# Patient Record
Sex: Female | Born: 1965 | Race: White | Hispanic: No | Marital: Married | Smoking: Never smoker
Health system: Southern US, Community
[De-identification: ages and names within clinical notes are randomized; demographics above are authoritative.]

## PROBLEM LIST (undated history)

## (undated) DIAGNOSIS — N12 Tubulo-interstitial nephritis, not specified as acute or chronic: Secondary | ICD-10-CM

## (undated) DIAGNOSIS — S73192A Other sprain of left hip, initial encounter: Secondary | ICD-10-CM

## (undated) DIAGNOSIS — Z9889 Other specified postprocedural states: Secondary | ICD-10-CM

## (undated) DIAGNOSIS — K589 Irritable bowel syndrome without diarrhea: Secondary | ICD-10-CM

## (undated) DIAGNOSIS — F419 Anxiety disorder, unspecified: Secondary | ICD-10-CM

## (undated) DIAGNOSIS — H8110 Benign paroxysmal vertigo, unspecified ear: Secondary | ICD-10-CM

## (undated) DIAGNOSIS — M5030 Other cervical disc degeneration, unspecified cervical region: Secondary | ICD-10-CM

## (undated) DIAGNOSIS — M436 Torticollis: Secondary | ICD-10-CM

## (undated) DIAGNOSIS — R079 Chest pain, unspecified: Secondary | ICD-10-CM

## (undated) DIAGNOSIS — R945 Abnormal results of liver function studies: Secondary | ICD-10-CM

## (undated) DIAGNOSIS — K5900 Constipation, unspecified: Secondary | ICD-10-CM

## (undated) DIAGNOSIS — R42 Dizziness and giddiness: Secondary | ICD-10-CM

## (undated) DIAGNOSIS — Z8759 Personal history of other complications of pregnancy, childbirth and the puerperium: Secondary | ICD-10-CM

## (undated) DIAGNOSIS — IMO0002 Reserved for concepts with insufficient information to code with codable children: Secondary | ICD-10-CM

## (undated) DIAGNOSIS — F4321 Adjustment disorder with depressed mood: Secondary | ICD-10-CM

## (undated) DIAGNOSIS — R161 Splenomegaly, not elsewhere classified: Secondary | ICD-10-CM

## (undated) DIAGNOSIS — D239 Other benign neoplasm of skin, unspecified: Secondary | ICD-10-CM

## (undated) DIAGNOSIS — R112 Nausea with vomiting, unspecified: Secondary | ICD-10-CM

## (undated) DIAGNOSIS — J189 Pneumonia, unspecified organism: Secondary | ICD-10-CM

## (undated) DIAGNOSIS — C539 Malignant neoplasm of cervix uteri, unspecified: Secondary | ICD-10-CM

## (undated) DIAGNOSIS — R519 Headache, unspecified: Secondary | ICD-10-CM

## (undated) DIAGNOSIS — F341 Dysthymic disorder: Secondary | ICD-10-CM

## (undated) DIAGNOSIS — M1612 Unilateral primary osteoarthritis, left hip: Secondary | ICD-10-CM

## (undated) DIAGNOSIS — K219 Gastro-esophageal reflux disease without esophagitis: Secondary | ICD-10-CM

## (undated) DIAGNOSIS — N200 Calculus of kidney: Secondary | ICD-10-CM

## (undated) DIAGNOSIS — N390 Urinary tract infection, site not specified: Secondary | ICD-10-CM

## (undated) DIAGNOSIS — N3010 Interstitial cystitis (chronic) without hematuria: Secondary | ICD-10-CM

## (undated) DIAGNOSIS — I479 Paroxysmal tachycardia, unspecified: Secondary | ICD-10-CM

## (undated) DIAGNOSIS — E785 Hyperlipidemia, unspecified: Secondary | ICD-10-CM

## (undated) HISTORY — DX: Malignant neoplasm of cervix uteri, unspecified: C53.9

## (undated) HISTORY — DX: Constipation, unspecified: K59.00

## (undated) HISTORY — DX: Tubulo-interstitial nephritis, not specified as acute or chronic: N12

## (undated) HISTORY — DX: Personal history of other complications of pregnancy, childbirth and the puerperium: Z87.59

## (undated) HISTORY — DX: Other benign neoplasm of skin, unspecified: D23.9

## (undated) HISTORY — DX: Interstitial cystitis (chronic) without hematuria: N30.10

## (undated) HISTORY — DX: Adjustment disorder with depressed mood: F43.21

## (undated) HISTORY — DX: Chest pain, unspecified: R07.9

## (undated) HISTORY — DX: Reserved for concepts with insufficient information to code with codable children: IMO0002

## (undated) HISTORY — DX: Calculus of kidney: N20.0

## (undated) HISTORY — DX: Anxiety disorder, unspecified: F41.9

## (undated) HISTORY — DX: Abnormal results of liver function studies: R94.5

## (undated) HISTORY — DX: Irritable bowel syndrome, unspecified: K58.9

## (undated) HISTORY — DX: Dysthymic disorder: F34.1

## (undated) HISTORY — DX: Pneumonia, unspecified organism: J18.9

## (undated) HISTORY — DX: Splenomegaly, not elsewhere classified: R16.1

## (undated) HISTORY — DX: Other cervical disc degeneration, unspecified cervical region: M50.30

## (undated) HISTORY — DX: Urinary tract infection, site not specified: N39.0

---

## 1995-08-28 HISTORY — PX: ABDOMINAL EXPLORATION SURGERY: SHX538

## 1998-06-24 ENCOUNTER — Ambulatory Visit (HOSPITAL_COMMUNITY): Admission: RE | Admit: 1998-06-24 | Discharge: 1998-06-24 | Payer: Self-pay | Admitting: Gastroenterology

## 1998-08-27 HISTORY — PX: ABDOMINAL HYSTERECTOMY: SHX81

## 1998-08-27 HISTORY — PX: TOTAL ABDOMINAL HYSTERECTOMY: SHX209

## 1999-03-08 ENCOUNTER — Other Ambulatory Visit: Admission: RE | Admit: 1999-03-08 | Discharge: 1999-03-08 | Payer: Self-pay | Admitting: Obstetrics & Gynecology

## 1999-04-19 ENCOUNTER — Other Ambulatory Visit: Admission: RE | Admit: 1999-04-19 | Discharge: 1999-04-19 | Payer: Self-pay | Admitting: Obstetrics & Gynecology

## 1999-08-03 ENCOUNTER — Other Ambulatory Visit: Admission: RE | Admit: 1999-08-03 | Discharge: 1999-08-03 | Payer: Self-pay | Admitting: Obstetrics & Gynecology

## 1999-10-24 ENCOUNTER — Inpatient Hospital Stay (HOSPITAL_COMMUNITY): Admission: RE | Admit: 1999-10-24 | Discharge: 1999-10-26 | Payer: Self-pay | Admitting: Obstetrics & Gynecology

## 2000-08-14 ENCOUNTER — Other Ambulatory Visit: Admission: RE | Admit: 2000-08-14 | Discharge: 2000-08-14 | Payer: Self-pay | Admitting: Obstetrics & Gynecology

## 2001-03-28 ENCOUNTER — Emergency Department (HOSPITAL_COMMUNITY): Admission: EM | Admit: 2001-03-28 | Discharge: 2001-03-29 | Payer: Self-pay | Admitting: Emergency Medicine

## 2001-03-29 ENCOUNTER — Encounter: Payer: Self-pay | Admitting: Emergency Medicine

## 2001-09-12 ENCOUNTER — Other Ambulatory Visit: Admission: RE | Admit: 2001-09-12 | Discharge: 2001-09-12 | Payer: Self-pay | Admitting: Obstetrics & Gynecology

## 2003-10-05 ENCOUNTER — Other Ambulatory Visit: Admission: RE | Admit: 2003-10-05 | Discharge: 2003-10-05 | Payer: Self-pay | Admitting: Obstetrics & Gynecology

## 2004-11-16 ENCOUNTER — Emergency Department: Payer: Self-pay | Admitting: Emergency Medicine

## 2005-12-24 ENCOUNTER — Ambulatory Visit: Payer: Self-pay | Admitting: Internal Medicine

## 2005-12-25 ENCOUNTER — Ambulatory Visit: Payer: Self-pay | Admitting: Internal Medicine

## 2007-02-15 ENCOUNTER — Inpatient Hospital Stay: Payer: Self-pay | Admitting: Internal Medicine

## 2007-03-26 ENCOUNTER — Ambulatory Visit: Payer: Self-pay | Admitting: Family Medicine

## 2007-04-11 ENCOUNTER — Ambulatory Visit: Payer: Self-pay | Admitting: Urology

## 2007-11-16 ENCOUNTER — Emergency Department: Payer: Self-pay | Admitting: Emergency Medicine

## 2007-11-16 ENCOUNTER — Other Ambulatory Visit: Payer: Self-pay

## 2008-04-07 ENCOUNTER — Ambulatory Visit: Payer: Self-pay

## 2008-05-10 ENCOUNTER — Ambulatory Visit: Payer: Self-pay | Admitting: Urology

## 2009-05-05 ENCOUNTER — Emergency Department: Payer: Self-pay | Admitting: Emergency Medicine

## 2009-08-02 ENCOUNTER — Ambulatory Visit: Payer: Self-pay | Admitting: Family Medicine

## 2010-01-31 ENCOUNTER — Ambulatory Visit: Payer: Self-pay | Admitting: Obstetrics and Gynecology

## 2010-04-03 ENCOUNTER — Ambulatory Visit: Payer: Self-pay | Admitting: Gastroenterology

## 2010-04-05 LAB — PATHOLOGY REPORT

## 2010-08-27 HISTORY — PX: COLONOSCOPY: SHX174

## 2010-10-30 ENCOUNTER — Ambulatory Visit: Payer: Self-pay

## 2011-01-28 ENCOUNTER — Ambulatory Visit: Payer: Self-pay | Admitting: Otolaryngology

## 2011-09-28 DIAGNOSIS — IMO0002 Reserved for concepts with insufficient information to code with codable children: Secondary | ICD-10-CM

## 2011-09-28 HISTORY — PX: ESOPHAGOGASTRODUODENOSCOPY: SHX1529

## 2011-09-28 HISTORY — DX: Reserved for concepts with insufficient information to code with codable children: IMO0002

## 2011-11-05 ENCOUNTER — Ambulatory Visit: Payer: Self-pay | Admitting: Gastroenterology

## 2012-01-02 LAB — HM MAMMOGRAPHY

## 2012-06-04 ENCOUNTER — Ambulatory Visit: Payer: Self-pay | Admitting: Family Medicine

## 2012-11-24 ENCOUNTER — Ambulatory Visit: Payer: BC Managed Care – PPO

## 2012-11-24 ENCOUNTER — Encounter: Payer: Self-pay | Admitting: Family Medicine

## 2012-11-24 ENCOUNTER — Ambulatory Visit (INDEPENDENT_AMBULATORY_CARE_PROVIDER_SITE_OTHER): Payer: BC Managed Care – PPO | Admitting: Family Medicine

## 2012-11-24 VITALS — BP 123/79 | HR 66 | Temp 97.2°F | Resp 16 | Ht 66.0 in | Wt 161.0 lb

## 2012-11-24 DIAGNOSIS — R1012 Left upper quadrant pain: Secondary | ICD-10-CM

## 2012-11-24 DIAGNOSIS — M542 Cervicalgia: Secondary | ICD-10-CM

## 2012-11-24 DIAGNOSIS — R1013 Epigastric pain: Secondary | ICD-10-CM

## 2012-11-24 DIAGNOSIS — H8110 Benign paroxysmal vertigo, unspecified ear: Secondary | ICD-10-CM

## 2012-11-24 DIAGNOSIS — R161 Splenomegaly, not elsewhere classified: Secondary | ICD-10-CM

## 2012-11-24 DIAGNOSIS — Z1239 Encounter for other screening for malignant neoplasm of breast: Secondary | ICD-10-CM

## 2012-11-24 DIAGNOSIS — T753XXA Motion sickness, initial encounter: Secondary | ICD-10-CM

## 2012-11-24 DIAGNOSIS — Z1231 Encounter for screening mammogram for malignant neoplasm of breast: Secondary | ICD-10-CM

## 2012-11-24 LAB — COMPREHENSIVE METABOLIC PANEL WITH GFR
ALT: 27 U/L (ref 0–35)
AST: 17 U/L (ref 0–37)
Albumin: 4.3 g/dL (ref 3.5–5.2)
Alkaline Phosphatase: 44 U/L (ref 39–117)
BUN: 14 mg/dL (ref 6–23)
CO2: 29 meq/L (ref 19–32)
Calcium: 9.5 mg/dL (ref 8.4–10.5)
Chloride: 102 meq/L (ref 96–112)
Creat: 0.71 mg/dL (ref 0.50–1.10)
Glucose, Bld: 86 mg/dL (ref 70–99)
Potassium: 4.2 meq/L (ref 3.5–5.3)
Sodium: 140 meq/L (ref 135–145)
Total Bilirubin: 0.6 mg/dL (ref 0.3–1.2)
Total Protein: 6.7 g/dL (ref 6.0–8.3)

## 2012-11-24 LAB — CBC WITH DIFFERENTIAL/PLATELET
Basophils Absolute: 0 10*3/uL (ref 0.0–0.1)
Basophils Relative: 1 % (ref 0–1)
Eosinophils Absolute: 0.1 10*3/uL (ref 0.0–0.7)
Eosinophils Relative: 1 % (ref 0–5)
HCT: 44.1 % (ref 36.0–46.0)
Hemoglobin: 15.1 g/dL — ABNORMAL HIGH (ref 12.0–15.0)
Lymphocytes Relative: 25 % (ref 12–46)
Lymphs Abs: 1.9 10*3/uL (ref 0.7–4.0)
MCH: 31.7 pg (ref 26.0–34.0)
MCHC: 34.2 g/dL (ref 30.0–36.0)
MCV: 92.5 fL (ref 78.0–100.0)
Monocytes Absolute: 0.3 10*3/uL (ref 0.1–1.0)
Monocytes Relative: 5 % (ref 3–12)
Neutro Abs: 5.3 10*3/uL (ref 1.7–7.7)
Neutrophils Relative %: 68 % (ref 43–77)
Platelets: 269 10*3/uL (ref 150–400)
RBC: 4.77 MIL/uL (ref 3.87–5.11)
RDW: 12.1 % (ref 11.5–15.5)
WBC: 7.6 10*3/uL (ref 4.0–10.5)

## 2012-11-24 LAB — POCT URINALYSIS DIPSTICK
Bilirubin, UA: NEGATIVE
Blood, UA: NEGATIVE
Glucose, UA: NEGATIVE
Ketones, UA: NEGATIVE
Leukocytes, UA: NEGATIVE
Nitrite, UA: NEGATIVE
Protein, UA: NEGATIVE
Spec Grav, UA: 1.025
Urobilinogen, UA: 0.2
pH, UA: 5.5

## 2012-11-24 LAB — TSH: TSH: 1.354 u[IU]/mL (ref 0.350–4.500)

## 2012-11-24 LAB — LIPASE: Lipase: 20 U/L (ref 0–75)

## 2012-11-24 LAB — AMYLASE: Amylase: 33 U/L (ref 0–105)

## 2012-11-24 MED ORDER — DEXLANSOPRAZOLE 60 MG PO CPDR: 60.0000 mg | DELAYED_RELEASE_CAPSULE | Freq: Every day | ORAL | Status: DC

## 2012-11-24 MED ORDER — MELOXICAM 15 MG PO TABS: 15.0000 mg | ORAL_TABLET | Freq: Every day | ORAL | Status: DC

## 2012-11-24 MED ORDER — SCOPOLAMINE 1 MG/3DAYS TD PT72: 1.0000 | MEDICATED_PATCH | TRANSDERMAL | Status: DC

## 2012-11-24 NOTE — Assessment & Plan Note (Signed)
New.  Known peptic ulcer disease in 2013; recent NSAID use; refill of Dexilant provided; refer for abdominal u/s; obtain labs.  Appointment next week with Dr. Niel Hummer.

## 2012-11-24 NOTE — Assessment & Plan Note (Signed)
New.  With L sided neck swelling; s/p CT neck negative by report.  Rx for Meloxicam 15mg  daily provided. To stop Ibuprofen due to GI symptoms.  Heat to area; home exercises daily.  Consider muscle relaxer.

## 2012-11-24 NOTE — Progress Notes (Signed)
[address]   [place], [place]  [address]   [phone]  Subjective:    Patient ID: Tracey Weaver, female    DOB: [birth year], 47 y.o.   MRN: [id number]  HPI This 47 y.o. female presents for evaluation of the following:  1.  Cruise to [place] in May: needs patches.  Gets motion sick in car and on water.    2.  Stress:  Stable.  Son with epilepsy; absent seizures; daughter moved to [place].  Daughter depended on pt too much so her move has been beneficial to patient.  3.  L neck pain with swelling: intermittent issue.  Had Dr. Sherrie Mustache look at it.  Scheduled CT scan that was negative.  Onset two months ago. Evaluated by Dr. Sherrie Mustache on Saturday.  Has also been undergoing chiropractic care; no C-spine films obtained.  Radiation into L upper arm.  No n/t/w.  No vision changes.  Taking a lot of Ibuprofen for pain.  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  Swelling L lateral neck with tenderness.    4.  LUQ pain:  Worried about spleenomegaly; no weight loss; no night sweats; +chill/sweats; no fevers.  Exhausted.  Has not felt well for past three weeks.  Really dizzy for several weeks; +vomiting with dizziness.  Moving head caused severe dizziness with vomiting.  Duration two days.  No ringing in ears; no hearing loss. No congestion.  Feels fine now.   Appointment next week with GI/[name].  Will get epigastric pain.  Worried about gallbladder; chronic constipation.  Nighttime awakening with nausea, epigastric pain.  Will take Prevacid, Tums.  Previously prescribed Dexilant by Niel Hummer for gastric ulcer 09/2011; appointment next week with [name].   Last EGD 09/2011.    5.  Mammogram:  [facility]   Review of Systems  Constitutional: Positive for chills and fatigue. Negative for fever, activity change, appetite change and unexpected weight change.  HENT: Negative for congestion, rhinorrhea, sneezing and postnasal drip.   Eyes: Negative for photophobia and visual  disturbance.  Gastrointestinal: Positive for nausea, vomiting, abdominal pain and constipation. Negative for diarrhea, blood in stool, abdominal distention and rectal pain.  Endocrine: Negative for cold intolerance, heat intolerance, polydipsia, polyphagia and polyuria.  Genitourinary: Negative for urgency, frequency, hematuria and flank pain.  Musculoskeletal: Positive for myalgias and back pain.  Skin: Negative for rash.  Neurological: Positive for dizziness. Negative for tremors, seizures, syncope, facial asymmetry, speech difficulty, weakness, light-headedness, numbness and headaches.        Past Medical History  Diagnosis Date  . Splenomegaly     s/p GI consult and hematology consult  . Pyelonephritis   . Pneumonia     Past Surgical History  Procedure Laterality Date  . Cesarean section    . Abdominal hysterectomy      Prior to Admission medications   Medication Sig Start Date End Date Taking? Authorizing Provider  dexlansoprazole (DEXILANT) 60 MG capsule Take 1 capsule (60 mg total) by mouth daily. 11/24/12   Ethelda Chick, MD  meloxicam (MOBIC) 15 MG tablet Take 1 tablet (15 mg total) by mouth daily. For neck pain 11/24/12   Ethelda Chick, MD  scopolamine (TRANSDERM-SCOP) 1.5 MG Place 1 patch (1.5 mg total) onto the skin every 3 (three) days. 11/24/12   Ethelda Chick, MD    No Known Allergies  History   Social History  . Marital Status: Married    Spouse Name: N/A  Number of Children: N/A  . Years of Education: N/A   Occupational History  . hairstylist    Social History Main Topics  . Smoking status: Never Smoker   . Smokeless tobacco: Not on file  . Alcohol Use: Yes     Comment: 1-2 drinks per month  . Drug Use: No  . Sexually Active: Yes -- Female partner(s)   Other Topics Concern  . Not on file   Social History Narrative   Marital status: married      Children: two      Lives: husband, son      Employment: hairdresser      Tobacco:  None       Alcohol: 1-2 servings per month.      Drugs:  none      Exercise:  Exercises 3x weekly    Family History  Problem Relation Age of Onset  . Hypertension Mother   . COPD Mother   . Cancer Father   . Diabetes Father   . Heart disease Father   . Cancer Brother   . Epilepsy Son   . Stroke Maternal Grandmother   . Stroke Maternal Grandfather   . Cancer Paternal Grandmother   . Heart disease Paternal Grandmother   . Stroke Paternal Grandfather     Objective:   Physical Exam  Nursing note and vitals reviewed. Constitutional: She is oriented to person, place, and time. She appears well-developed and well-nourished. No distress.  HENT:  Head: Normocephalic and atraumatic.  Right Ear: External ear normal.  Left Ear: External ear normal.  Nose: Nose normal.  Mouth/Throat: Oropharynx is clear and moist.  Eyes: Conjunctivae and EOM are normal. Pupils are equal, round, and reactive to light.  Neck: Normal range of motion. Neck supple. No thyromegaly present.  Cardiovascular: Normal rate, regular rhythm and normal heart sounds.  Exam reveals no gallop and no friction rub.   No murmur heard. Pulmonary/Chest: Effort normal and breath sounds normal. No respiratory distress. She has no wheezes. She has no rales.  Abdominal: Soft. Bowel sounds are normal. She exhibits no distension and no mass. There is no hepatosplenomegaly. There is tenderness in the epigastric area and left upper quadrant. There is no rebound, no guarding and no CVA tenderness. No hernia.  Musculoskeletal:       Right shoulder: Normal.       Left shoulder: Normal.       Cervical back: She exhibits decreased range of motion, tenderness, swelling, pain and spasm. She exhibits normal pulse.  CERVICAL SPINE:  NO MIDLINE TTP; +TTP L TRAPEZIUS AND LATERAL NECK; +SWELLING MILD L LATERAL NECK; PAIN WITH FLEXION; NORMAL ROTATION AND LATERAL BENDING.  MOTOR 5/5 BUE.  Lymphadenopathy:    She has no cervical adenopathy.  Neurological:  She is alert and oriented to person, place, and time. She has normal reflexes. No cranial nerve deficit. She exhibits normal muscle tone. Coordination normal.  Skin: Skin is warm and dry. No rash noted. She is not diaphoretic.  Psychiatric: She has a normal mood and affect. Her behavior is normal. Judgment and thought content normal.      [facility] reading (PRIMARY) by  Dr. Katrinka Blazing.  C-SPINE: NARROWING C5-6; +SPURRING.  Results for orders placed in visit on 11/24/12  POCT URINALYSIS DIPSTICK      Result Value Range   Color, UA yellow     Clarity, UA clear     Glucose, UA neg     Bilirubin, UA neg  Ketones, UA neg     Spec Grav, UA 1.025     Blood, UA neg     pH, UA 5.5     Protein, UA neg     Urobilinogen, UA 0.2     Nitrite, UA neg     Leukocytes, UA Negative      Assessment & Plan:  Neck pain on left side - Plan: DG Cervical Spine Complete  Abdominal pain, left upper quadrant - Plan: CBC with Differential, Comprehensive metabolic panel, Lipase, Amylase, POCT urinalysis dipstick, US Abdomen Complete  Benign paroxysmal positional vertigo - Plan: TSH  Splenomegaly - Plan: US Abdomen Complete  Abdominal pain, epigastric  Meds ordered this encounter  Medications  . meloxicam (MOBIC) 15 MG tablet    Sig: Take 1 tablet (15 mg total) by mouth daily. For neck pain    Dispense:  30 tablet    Refill:  0  . dexlansoprazole (DEXILANT) 60 MG capsule    Sig: Take 1 capsule (60 mg total) by mouth daily.    Dispense:  30 capsule    Refill:  5  . scopolamine (TRANSDERM-SCOP) 1.5 MG    Sig: Place 1 patch (1.5 mg total) onto the skin every 3 (three) days.    Dispense:  4 patch    Refill:  0

## 2012-11-24 NOTE — Assessment & Plan Note (Signed)
New.  Transient; duration two days; normal neuro exam; obtain labs. RTC if recurs or worsens.

## 2012-11-24 NOTE — Assessment & Plan Note (Signed)
Due for annual mammogram; refer.

## 2012-11-24 NOTE — Assessment & Plan Note (Signed)
Stable; now with LUQ pain; refer for abdominal u/s.

## 2012-11-24 NOTE — Assessment & Plan Note (Signed)
New. Associated with epigastric pain, NSAID use; history of splenomegaly; obtain labs; refer for abdominal u/s.

## 2012-11-24 NOTE — Assessment & Plan Note (Signed)
New.  Scheduled for [place] to [place] in May; rx for scopolamine patches.

## 2012-11-24 NOTE — Patient Instructions (Addendum)
Neck pain on left side - Plan: DG Cervical Spine Complete  Abdominal pain, left upper quadrant - Plan: CBC with Differential, Comprehensive metabolic panel, Lipase, Amylase, POCT urinalysis dipstick, US Abdomen Complete  Benign paroxysmal positional vertigo - Plan: TSH  Splenomegaly - Plan: US Abdomen Complete  Abdominal pain, epigastric

## 2012-11-27 ENCOUNTER — Encounter: Payer: Self-pay | Admitting: Family Medicine

## 2012-12-08 ENCOUNTER — Encounter: Payer: Self-pay | Admitting: Family Medicine

## 2012-12-10 ENCOUNTER — Telehealth: Payer: Self-pay | Admitting: *Deleted

## 2012-12-10 NOTE — Telephone Encounter (Signed)
Patient called to find out if [facility] had sent the report of her [place] to [place]. I looked at patient chart and did not see it, but let pt know if it had not been scanned it may still be here but the doctor may not have seen it yet. Let patient know Dr. Katrinka Blazing was out of town and I would talk to her on Monday. Patient will call [facility] back to make sure they sent it because they had sent report to wrong office the first time it was originally sent, per patient.

## 2012-12-19 ENCOUNTER — Telehealth: Payer: Self-pay | Admitting: Radiology

## 2012-12-19 NOTE — Telephone Encounter (Signed)
error 

## 2012-12-22 ENCOUNTER — Ambulatory Visit
Admission: RE | Admit: 2012-12-22 | Discharge: 2012-12-22 | Disposition: A | Discharge status: Home / Self Care | Payer: BC Managed Care – PPO | Source: Ambulatory Visit | Attending: Family Medicine | Admitting: Family Medicine

## 2012-12-22 DIAGNOSIS — Z1231 Encounter for screening mammogram for malignant neoplasm of breast: Secondary | ICD-10-CM

## 2012-12-26 ENCOUNTER — Encounter: Payer: Self-pay | Admitting: Family Medicine

## 2013-01-30 ENCOUNTER — Encounter: Payer: Self-pay | Admitting: *Deleted

## 2013-05-12 ENCOUNTER — Other Ambulatory Visit: Payer: Self-pay

## 2013-05-12 MED ORDER — DEXLANSOPRAZOLE 60 MG PO CPDR: 60.0000 mg | DELAYED_RELEASE_CAPSULE | Freq: Every day | ORAL | Status: DC

## 2013-07-02 ENCOUNTER — Other Ambulatory Visit: Payer: Self-pay

## 2013-07-03 ENCOUNTER — Ambulatory Visit: Payer: Self-pay | Admitting: Urology

## 2013-08-27 DIAGNOSIS — D239 Other benign neoplasm of skin, unspecified: Secondary | ICD-10-CM

## 2013-08-27 HISTORY — DX: Other benign neoplasm of skin, unspecified: D23.9

## 2013-09-10 ENCOUNTER — Other Ambulatory Visit: Payer: Self-pay

## 2013-09-10 DIAGNOSIS — Z1231 Encounter for screening mammogram for malignant neoplasm of breast: Secondary | ICD-10-CM

## 2013-11-30 ENCOUNTER — Ambulatory Visit (INDEPENDENT_AMBULATORY_CARE_PROVIDER_SITE_OTHER): Payer: BC Managed Care – PPO | Admitting: Family Medicine

## 2013-11-30 ENCOUNTER — Encounter: Payer: Self-pay | Admitting: Family Medicine

## 2013-11-30 VITALS — BP 130/76 | HR 70 | Temp 98.2°F | Resp 16 | Ht 65.0 in | Wt 160.2 lb

## 2013-11-30 DIAGNOSIS — Z87410 Personal history of cervical dysplasia: Secondary | ICD-10-CM

## 2013-11-30 DIAGNOSIS — Z01419 Encounter for gynecological examination (general) (routine) without abnormal findings: Secondary | ICD-10-CM

## 2013-11-30 DIAGNOSIS — K5900 Constipation, unspecified: Secondary | ICD-10-CM

## 2013-11-30 DIAGNOSIS — Z0000 Encounter for general adult medical examination without abnormal findings: Secondary | ICD-10-CM

## 2013-11-30 DIAGNOSIS — Z1211 Encounter for screening for malignant neoplasm of colon: Secondary | ICD-10-CM

## 2013-11-30 LAB — POCT URINALYSIS DIPSTICK
BILIRUBIN UA: NEGATIVE
Blood, UA: NEGATIVE
GLUCOSE UA: NEGATIVE
KETONES UA: NEGATIVE
LEUKOCYTES UA: NEGATIVE
Nitrite, UA: NEGATIVE
PROTEIN UA: NEGATIVE
SPEC GRAV UA: 1.02
Urobilinogen, UA: 1
pH, UA: 7

## 2013-11-30 LAB — LIPID PANEL
CHOL/HDL RATIO: 4 ratio
CHOLESTEROL: 226 mg/dL — AB (ref 0–200)
HDL: 57 mg/dL (ref >39)
LDL Cholesterol: 137 mg/dL — ABNORMAL HIGH (ref 0–99)
Triglycerides: 162 mg/dL — ABNORMAL HIGH (ref <150)
VLDL: 32 mg/dL (ref 0–40)

## 2013-11-30 LAB — CBC WITH DIFFERENTIAL/PLATELET
BASOS ABS: 0 10*3/uL (ref 0.0–0.1)
BASOS PCT: 1 % (ref 0–1)
EOS PCT: 2 % (ref 0–5)
Eosinophils Absolute: 0.1 10*3/uL (ref 0.0–0.7)
HEMATOCRIT: 42.7 % (ref 36.0–46.0)
Hemoglobin: 14.6 g/dL (ref 12.0–15.0)
Lymphocytes Relative: 34 % (ref 12–46)
Lymphs Abs: 1.7 10*3/uL (ref 0.7–4.0)
MCH: 31.5 pg (ref 26.0–34.0)
MCHC: 34.2 g/dL (ref 30.0–36.0)
MCV: 92 fL (ref 78.0–100.0)
MONO ABS: 0.2 10*3/uL (ref 0.1–1.0)
Monocytes Relative: 5 % (ref 3–12)
Neutro Abs: 2.8 10*3/uL (ref 1.7–7.7)
Neutrophils Relative %: 58 % (ref 43–77)
Platelets: 245 10*3/uL (ref 150–400)
RBC: 4.64 MIL/uL (ref 3.87–5.11)
RDW: 12.6 % (ref 11.5–15.5)
WBC: 4.9 10*3/uL (ref 4.0–10.5)

## 2013-11-30 LAB — COMPLETE METABOLIC PANEL WITH GFR
ALK PHOS: 55 U/L (ref 39–117)
ALT: 29 U/L (ref 0–35)
AST: 19 U/L (ref 0–37)
Albumin: 4.2 g/dL (ref 3.5–5.2)
BUN: 18 mg/dL (ref 6–23)
CO2: 27 mEq/L (ref 19–32)
Calcium: 9 mg/dL (ref 8.4–10.5)
Chloride: 102 mEq/L (ref 96–112)
Creat: 0.62 mg/dL (ref 0.50–1.10)
GFR, Est African American: >89 mL/min
GLUCOSE: 108 mg/dL — AB (ref 70–99)
Potassium: 4.1 mEq/L (ref 3.5–5.3)
Sodium: 138 mEq/L (ref 135–145)
Total Bilirubin: 0.6 mg/dL (ref 0.2–1.2)
Total Protein: 6.5 g/dL (ref 6.0–8.3)

## 2013-11-30 LAB — POCT GLYCOSYLATED HEMOGLOBIN (HGB A1C): HEMOGLOBIN A1C: 4.9

## 2013-11-30 LAB — IFOBT (OCCULT BLOOD): IFOBT: NEGATIVE

## 2013-11-30 LAB — TSH: TSH: 1.225 u[IU]/mL (ref 0.350–4.500)

## 2013-11-30 MED ORDER — LUBIPROSTONE 8 MCG PO CAPS: 8.0000 ug | ORAL_CAPSULE | Freq: Two times a day (BID) | ORAL | Status: DC

## 2013-11-30 NOTE — Progress Notes (Signed)
Subjective:    Patient ID: Tracey Weaver, female    DOB: Aug 07, 1966, 48 y.o.   MRN: [id number]  This chart was scribed for Tracey Honour, MD by Maree Erie, ED Scribe. The patient was seen in room 22. Patient's care was started at 9:42 AM.  Chief Complaint  Patient presents with  . Annual Exam    PCP: Reginia Forts, MD  HPI  Tracey Weaver is a 48 y.o. female who presents to office for a complete physical exam. Last office visit with me was 11/24/12 for neck pain and LUQ abdominal pain. I advised her to stop her NSAID use and prescribed her Meloxicam for neck pain. I referred her for Abdominal US to follow up on chronic splenomegaly. Labs were normal at that visit.    Mammogram: 12/2012, normal Abdominal US: 12/2012, normal with spleen smaller than last measurement and WNL. Pap smear: 2013, normal per patient Colonoscopy: 2012, normal per patient; [name]. Tetanus: 2009 Eye Exam: 2014, states she goes every year. Does not wear contacts but wears reading and long distance glasses. Dental Exam: goes every six months. Has an appointment in a few days.  CPE: 2013  1. Neck pain: She was prescribed Meloxicam for neck pain last year. She states that was seen by a Chiropractor and states the pain has completely resolved with work from him. She has taken Diclofenac in the past for arthritis and has also been drinking joint juice, which she believes has alleviated all her pain. She currently drinks it everyday.   S/p rheumatology consultation by Dr. Precious Reel; no evidence of autoimmune process; dx with OA and DDD cervical spine.  2. LUQ abdominal pain: Her GI doctor/[name] has closed his practice and moved. She was taking Amatiza for IBS/constipation and states that it significantly improved abdominal pain. She would like a refill since she ran out last year after he moved. She went to see a GI doctor in [facility]) but did not get along with him. She states the pain has improved for now  but she attributes this to her watching what she eats. She eats yogurt everyday to help her IBS. She denies bloody stool, polyuria, incontinence, vaginal bleeding, discharge, itching or pain. S/p abdominal u/s 2014 with normal gallbladder.  3. Hx of Migraines: She gets 6-7 migraines a month. She has been seen by a specialist at [facility] and takes Excedrin migraine as soon as she feels them coming on with relief.   Surgical History She has had two cesarean sections, an exploratory surgery post left fallopian tube and a hysterectomy at 66 for pre-cancerous cells found.. She had a mole resected from her back that was pre-cancerous and about to become melanoma. She is no longer going to the tanning bed and uses sunless tanning lotion.     Family History: Her mother had her first MI at 11 and has had a total of two. She has had stents placed with both MI. She is a former smoker and consumes alcohol. Her father died of MI in his 66s. His first MI was in his 62s. He also had a history of prostate cancer. She has two brothers. Her younger brother had germ cell cancer in his chest, diagnosed at 24. He is currently cancer free post surgical intervention.    Social History She has been happily married 17 years. She denies abuse. She has two children that are 23 ad 16. One grandson that is five. They are currently visiting. 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 She believes she is working a minimum of 50 hours a week. She reports occasional alcohol use. She is not currently exercising but wants to start again. She wears her seatbelt compliantly.    Medical History: She denies seasonal allergies. She denies chest pain, sores in mouth that won't heal, cough or shortness of breath. She does not snore. She checks her breast regularly. She has been having unchanged, persistent soreness in her left nipple for years. She denies issues with sleep and goes to bed around 10:30 and wakes  around 6 AM. She takes a half Tylenol PM for sleep at night.   Patient Active Problem List   Diagnosis Date Noted  . Neck pain on left side 11/24/2012  . Abdominal pain, left upper quadrant 11/24/2012  . Benign paroxysmal positional vertigo 11/24/2012  . Splenomegaly 11/24/2012  . Abdominal pain, epigastric 11/24/2012  . Breast cancer screening 11/24/2012  . Motion sickness 11/24/2012   Past Medical History  Diagnosis Date  . Splenomegaly     s/p GI consult and hematology consult  . Pyelonephritis   . Pneumonia   . Ulcer 09/28/2011    EGD: +gastric ulcer; rx for Dexilant.  [name].  . Interstitial cystitis   . Recurrent UTI   . Kidney stones   . Chest pain   . Constipation   . Cervical cancer     precancerous cells  . Dysthymic disorder   . Adjustment disorder with depressed mood   . History of ectopic pregnancy     Multiple  . Liver function study, abnormal   . IBS (irritable bowel syndrome)     Constipation; Rx for Amitiza worked well.  . Degenerative disc disease, cervical    Past Surgical History  Procedure Laterality Date  . Abdominal hysterectomy      one ovary remaining  . Esophagogastroduodenoscopy  09/28/2011    +gastric ulcer.  [name].  . Cesarean section      x 2.   No Known Allergies Prior to Admission medications   Medication Sig Start Date End Date Taking? Authorizing Provider  dexlansoprazole (DEXILANT) 60 MG capsule Take 1 capsule (60 mg total) by mouth daily. PATIENT NEEDS OFFICE VISIT FOR ADDITIONAL REFILLS 05/12/13  Yes Tracey Honour, MD  lubiprostone (AMITIZA) 8 MCG capsule Take 1 capsule (8 mcg total) by mouth 2 (two) times daily with a meal. 11/30/13   Tracey Honour, MD   History   Social History  . Marital Status: Married    Spouse Name: N/A    Number of Children: 2  . Years of Education: 14   Occupational History  . hairstylist    Social History Main Topics  . Smoking status: Never Smoker   . Smokeless tobacco: Not on file  .  Alcohol Use: Yes     Comment: 1-2 drinks per month  . Drug Use: No  . Sexual Activity: Yes    Partners: Male   Other Topics Concern  . 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      Tobacco:  None      Alcohol: 1-2 servings per month.      Drugs:  none  Exercise:  none      Caffeine use: Coffee, 1 serving per day   Always uses seat belts, smoke alarm and carbon monoxide detector in the home, Guns locked up.   Organ Donor: YES   Patient DOES not have living will, DOES not have HCPOA.             Review of Systems  Constitutional: Negative for fever, chills, diaphoresis, activity change, appetite change, fatigue and unexpected weight change.  HENT: Negative for congestion, dental problem, drooling, ear discharge, ear pain, facial swelling, hearing loss, mouth sores, nosebleeds, postnasal drip, rhinorrhea, sinus pressure, sneezing, sore throat, tinnitus, trouble swallowing and voice change.   Eyes: Negative for photophobia, pain, discharge, redness, itching and visual disturbance.  Respiratory: Negative for apnea, cough, choking, chest tightness, shortness of breath, wheezing and stridor.   Cardiovascular: Negative for chest pain, palpitations and leg swelling.  Gastrointestinal: Positive for constipation. Negative for nausea, vomiting, abdominal pain, diarrhea, blood in stool, abdominal distention, anal bleeding and rectal pain.  Endocrine: Negative for cold intolerance, heat intolerance, polydipsia, polyphagia and polyuria.  Genitourinary: Negative for hematuria, vaginal bleeding, vaginal discharge and vaginal pain.  Musculoskeletal: Positive for arthralgias. Negative for back pain, gait problem, joint swelling, myalgias, neck pain and neck stiffness.  Skin: Negative for color change,  pallor, rash and wound.  Allergic/Immunologic: Negative for immunocompromised state.  Neurological: Positive for headaches. Negative for dizziness, tremors, seizures, syncope, facial asymmetry, speech difficulty, weakness, light-headedness and numbness.  Hematological: Negative for adenopathy.  Psychiatric/Behavioral: Negative for confusion, sleep disturbance and dysphoric mood. The patient is not nervous/anxious.        Objective:   Physical Exam  Nursing note and vitals reviewed. Constitutional: She is oriented to person, place, and time. She appears well-developed and well-nourished. No distress.  HENT:  Head: Normocephalic and atraumatic.  Right Ear: Tympanic membrane, external ear and ear canal normal.  Left Ear: Tympanic membrane, external ear and ear canal normal.  Nose: Nose normal.  Mouth/Throat: Oropharynx is clear and moist. No oropharyngeal exudate.  Eyes: Conjunctivae and EOM are normal. Pupils are equal, round, and reactive to light.  Neck: Normal range of motion. Neck supple. Carotid bruit is not present. No tracheal deviation present. No thyromegaly present.  Cardiovascular: Normal rate, regular rhythm, normal heart sounds and intact distal pulses.  Exam reveals no gallop and no friction rub.   No murmur heard. Pulmonary/Chest: Effort normal and breath sounds normal. No respiratory distress. She has no wheezes. She has no rales.  Abdominal: Soft. Bowel sounds are normal. She exhibits no distension. There is no tenderness. There is no rebound and no guarding.  Genitourinary: Rectum normal and vagina normal. No breast swelling, discharge or bleeding. There is no rash, tenderness or lesion on the right labia. There is no rash, tenderness or lesion on the left labia. Right adnexum displays no mass and no tenderness. Left adnexum displays no mass and no tenderness.  Musculoskeletal: Normal range of motion.  Lymphadenopathy:    She has no cervical adenopathy.  Neurological: She  is alert and oriented to person, place, and time. She exhibits normal muscle tone. Coordination normal.  Skin: Skin is warm and dry. No rash noted. She is not diaphoretic.  Psychiatric: She has a normal mood and affect. Her behavior is normal. Judgment and thought content normal.   EKG: NSR; no ST changes.  Results for orders placed in visit on 11/30/13  POCT URINALYSIS DIPSTICK      Result Value Ref  Range   Color, UA yellow     Clarity, UA cloudy     Glucose, UA neg     Bilirubin, UA neg     Ketones, UA neg     Spec Grav, UA 1.020     Blood, UA neg     pH, UA 7.0     Protein, UA neg     Urobilinogen, UA 1.0     Nitrite, UA neg     Leukocytes, UA Negative    POCT GLYCOSYLATED HEMOGLOBIN (HGB A1C)      Result Value Ref Range   Hemoglobin A1C 4.9    IFOBT (OCCULT BLOOD)      Result Value Ref Range   IFOBT Negative         Assessment & Plan:  Routine general medical examination at a health care facility - Plan: CBC with Differential, COMPLETE METABOLIC PANEL WITH GFR, TSH, Lipid panel, POCT urinalysis dipstick, EKG 12-Lead, POCT glycosylated hemoglobin (Hb A1C), IFOBT POC (occult bld, rslt in office), CANCELED: Hemoglobin A1c  History of cervical dysplasia - Plan: Pap IG (Image Guided)  Routine gynecological examination - Plan: Pap IG (Image Guided)  Colon cancer screening - Plan: IFOBT POC (occult bld, rslt in office)  Constipation - Plan: lubiprostone (AMITIZA) 8 MCG capsule, Ambulatory referral to Gastroenterology  1. CPE: anticipatory guidance ---- exercise, sunscreen, calcium 3 servings of dairy daily. Pap smear obtained; pt is scheduled for mammogram. Hemosure negative.  Immunizations UTD. Obtain labs. 2.  Gynecological exam: completed; pap smear obtained; scheduled for mammogram. 3.  History of cervical dysplasia:  S/p pap smear in office today.  S/p hysterectomy age 42. 49.  Colon cancer screening: colonoscopy UTD; hemosure negative. 5.  IBS constipation predominant:  refer to [facility]; rx for Amitiza 8mg  bid.   If abdominal pain recurs, refer for HIDA scan; pt to call if develops recurrent abdominal pain. 6.  Interstitial cystitis: stable; followed by Dr. [name].  Due for cystoscopy in June 2015. 7. Abdominal pain LUQ: improved after last visit; if recurs, will schedule HIDA scan; s/p GI consultation with Allen Norris last summer. 8. Dysplastic nevus: New. S/p resection by Dr. Phillip Heal.  Now avoiding tanning bed. 7. Splenomegaly: normal spleen on abdominal u/s 2014.  Meds ordered this encounter  Medications  . lubiprostone (AMITIZA) 8 MCG capsule    Sig: Take 1 capsule (8 mcg total) by mouth 2 (two) times daily with a meal.    Dispense:  60 capsule    Refill:  11   I personally performed the services described in this documentation, which was scribed in my presence.  The recorded information has been reviewed and is accurate.  Reginia Forts, M.D.  [facility] [address] [place], [place]  [address] [phone] phone [phone] fax

## 2013-11-30 NOTE — Patient Instructions (Signed)
1. Recommend exercising for 30-45 minutes five days per week. 2. Recommend 3 servings of dairy daily (yogurt, cheese, milk, ice cream).

## 2013-12-02 ENCOUNTER — Encounter: Payer: Self-pay | Admitting: Family Medicine

## 2013-12-02 LAB — PAP IG (IMAGE GUIDED)

## 2013-12-23 ENCOUNTER — Ambulatory Visit
Admission: RE | Admit: 2013-12-23 | Discharge: 2013-12-23 | Disposition: A | Discharge status: Home / Self Care | Payer: BC Managed Care – PPO | Source: Ambulatory Visit

## 2013-12-23 DIAGNOSIS — Z1231 Encounter for screening mammogram for malignant neoplasm of breast: Secondary | ICD-10-CM

## 2013-12-24 ENCOUNTER — Encounter: Payer: Self-pay | Admitting: Family Medicine

## 2014-01-04 DIAGNOSIS — K219 Gastro-esophageal reflux disease without esophagitis: Secondary | ICD-10-CM | POA: Insufficient documentation

## 2014-01-04 DIAGNOSIS — K589 Irritable bowel syndrome without diarrhea: Secondary | ICD-10-CM | POA: Insufficient documentation

## 2014-01-11 ENCOUNTER — Telehealth: Payer: Self-pay

## 2014-01-11 MED ORDER — SCOPOLAMINE 1 MG/3DAYS TD PT72: 1.0000 | MEDICATED_PATCH | TRANSDERMAL | Status: DC

## 2014-01-11 NOTE — Telephone Encounter (Signed)
Spoke to patient.  Patches sent to pharmacy.  Patient aware.

## 2014-01-11 NOTE — Telephone Encounter (Signed)
PT STATES SHE IS GOING ON A FISHING TRIP THIS WEEKEND AND WOULD LIKE TO HAVE SOMETHING CALLED IN FOR MOTION SICKNESS. PLEASE CALL [phone]     [facility]

## 2014-01-11 NOTE — Telephone Encounter (Signed)
Approved motion sickness patch and sent to pharmacy.  Please advise patient.

## 2014-01-11 NOTE — Telephone Encounter (Signed)
Pt requesting medication for motion sickness. She has tried Dramamine, makes her very sleepy. She would like to have the patches prescribed to her the same as last year. Please advise if refill is appropriate. I have pended order.

## 2014-01-24 ENCOUNTER — Encounter: Payer: Self-pay | Admitting: Family Medicine

## 2014-01-24 DIAGNOSIS — R1013 Epigastric pain: Secondary | ICD-10-CM

## 2014-02-01 ENCOUNTER — Encounter: Payer: Self-pay | Admitting: Family Medicine

## 2014-02-01 ENCOUNTER — Ambulatory Visit (INDEPENDENT_AMBULATORY_CARE_PROVIDER_SITE_OTHER): Payer: BC Managed Care – PPO | Admitting: Family Medicine

## 2014-02-01 VITALS — BP 116/77 | HR 89 | Temp 98.2°F | Resp 17 | Ht 65.5 in | Wt 164.0 lb

## 2014-02-01 DIAGNOSIS — R5383 Other fatigue: Secondary | ICD-10-CM

## 2014-02-01 DIAGNOSIS — R590 Localized enlarged lymph nodes: Secondary | ICD-10-CM

## 2014-02-01 DIAGNOSIS — W57XXXA Bitten or stung by nonvenomous insect and other nonvenomous arthropods, initial encounter: Secondary | ICD-10-CM

## 2014-02-01 DIAGNOSIS — S0096XA Insect bite (nonvenomous) of unspecified part of head, initial encounter: Secondary | ICD-10-CM

## 2014-02-01 DIAGNOSIS — R599 Enlarged lymph nodes, unspecified: Secondary | ICD-10-CM

## 2014-02-01 DIAGNOSIS — R5381 Other malaise: Secondary | ICD-10-CM

## 2014-02-01 LAB — CBC WITH DIFFERENTIAL/PLATELET
BASOS ABS: 0.1 10*3/uL (ref 0.0–0.1)
Basophils Relative: 1 % (ref 0–1)
EOS ABS: 0.1 10*3/uL (ref 0.0–0.7)
EOS PCT: 1 % (ref 0–5)
HEMATOCRIT: 41.4 % (ref 36.0–46.0)
Hemoglobin: 14.4 g/dL (ref 12.0–15.0)
Lymphocytes Relative: 37 % (ref 12–46)
Lymphs Abs: 2.4 10*3/uL (ref 0.7–4.0)
MCH: 31.6 pg (ref 26.0–34.0)
MCHC: 34.8 g/dL (ref 30.0–36.0)
MCV: 91 fL (ref 78.0–100.0)
Monocytes Absolute: 0.4 10*3/uL (ref 0.1–1.0)
Monocytes Relative: 6 % (ref 3–12)
Neutro Abs: 3.6 10*3/uL (ref 1.7–7.7)
Neutrophils Relative %: 55 % (ref 43–77)
PLATELETS: 257 10*3/uL (ref 150–400)
RBC: 4.55 MIL/uL (ref 3.87–5.11)
RDW: 12.4 % (ref 11.5–15.5)
WBC: 6.6 10*3/uL (ref 4.0–10.5)

## 2014-02-01 NOTE — Patient Instructions (Signed)
1.  Return in 2 weeks if swollen lymph node has not completely resolved/improved. 2.  We will call you with lab results. 3.  You have completed the appropriate amount of Doxycycline therapy for a tick borne illness.

## 2014-02-01 NOTE — Progress Notes (Signed)
Subjective:    Patient ID: Tracey Weaver, female    DOB: February 08, 1966, 48 y.o.   MRN: [id number]  02/01/2014  Neck Pain and Fatigue   Neck Pain  Associated symptoms include headaches. Pertinent negatives include no fever.   This 48 y.o. female presents for evaluation of R cervical LAD.  Found tick on scalp last week; feeling horrible; husband made patient present to ED.  Prescribed Doxycycline; felt like swollen lymph secondary to tick bite.  Exhausted.  Slept all day long the following day.  Extremely hungry.No fever/chills but +sweats.  +HA occipital x intermittent chronic without worsening.  No sore throat; no rhinorrhea; no nasal congestion.  Did have strep throat two times in past month.  No n/v/d.  No scalp irritation or pain; cannot palpate area any longer.  No pain with combing hair.  No other swollen areas.  No labs performed in ED.  Might have L sided cervical LAD.  R posterior cervical LAD improved from onset; completes ten day course of Doxy tomorrow.  Hairdresser.  History of idiopathic splenomegaly; s/p skin cancer resection L upper back in past year.   Review of Systems  Constitutional: Positive for diaphoresis and fatigue. Negative for fever and chills.  HENT: Negative for congestion, ear pain, mouth sores, postnasal drip, rhinorrhea and sore throat.   Respiratory: Negative for cough and shortness of breath.   Gastrointestinal: Negative for nausea, vomiting, abdominal pain and diarrhea.  Musculoskeletal: Positive for neck pain.  Skin: Negative for rash.  Neurological: Positive for headaches. Negative for dizziness.  Hematological: Positive for adenopathy. Does not bruise/bleed easily.    Past Medical History  Diagnosis Date  . Splenomegaly     s/p GI consult and hematology consult  . Pyelonephritis   . Pneumonia   . Ulcer 09/28/2011    EGD: +gastric ulcer; rx for Dexilant.  [name].  . Interstitial cystitis     [facility]  . Recurrent UTI   . Kidney stones   .  Chest pain   . Constipation   . Cervical cancer     precancerous cells  . Dysthymic disorder   . Adjustment disorder with depressed mood   . History of ectopic pregnancy     Multiple  . Liver function study, abnormal   . IBS (irritable bowel syndrome)     Constipation; Rx for Amitiza worked well.  . Degenerative disc disease, cervical   . Dysplastic nevus 08/27/2013    s/p resection by Dr. Aubery Lapping.   Past Surgical History  Procedure Laterality Date  . Esophagogastroduodenoscopy  09/28/2011    +gastric ulcer.  [name].  . Cesarean section      x 2.  . Abdominal hysterectomy      cervical dysplasia;one ovary remaining  . Colonoscopy  08/27/2010    [name].      No Known Allergies Current Outpatient Prescriptions  Medication Sig Dispense Refill  . dexlansoprazole (DEXILANT) 60 MG capsule Take 1 capsule (60 mg total) by mouth daily. PATIENT NEEDS OFFICE VISIT FOR ADDITIONAL REFILLS  30 capsule  0  . doxycycline (VIBRAMYCIN) 100 MG capsule Take 100 mg by mouth 2 (two) times daily.      [name] lubiprostone (AMITIZA) 8 MCG capsule Take 1 capsule (8 mcg total) by mouth 2 (two) times daily with a meal.  60 capsule  11  . scopolamine (TRANSDERM-SCOP) 1 MG/3DAYS Place 1 patch (1.5 mg total) onto the skin every 3 (three) days.  4 patch  0   No current  facility-administered medications for this visit.   History   Social History  . Marital Status: Married    Spouse Name: N/A    Number of Children: 2  . Years of Education: 14   Occupational History  . hairstylist    Social History Main Topics  . Smoking status: Never Smoker   . Smokeless tobacco: Not on file  . Alcohol Use: Yes     Comment: 1-2 drinks per month  . Drug Use: No  . Sexual Activity: Yes    Partners: Male   Other Topics Concern  . 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      Tobacco:  None      Alcohol: 1-2 servings per month.      Drugs:  none      Exercise:  none      Caffeine use: Coffee, 1 serving per day   Always uses seat belts, smoke alarm and carbon monoxide detector in the home, Guns locked up.   Organ Donor: YES   Patient DOES not have living will, DOES not have HCPOA.                Objective:    BP 116/77  Pulse 89  Temp(Src) 98.2 F (36.8 C) (Oral)  Resp 17  Ht 5' 5.5" (1.664 m)  Wt 164 lb (74.39 kg)  BMI 26.87 kg/m2  SpO2 99% Physical Exam  Nursing note and vitals reviewed. Constitutional: She is oriented to person, place, and time. She appears well-developed and well-nourished. No distress.  HENT:  Head: Normocephalic and atraumatic.  Eyes: Conjunctivae are normal. Pupils are equal, round, and reactive to light.  Neck: Normal range of motion. Neck supple.  Cardiovascular: Normal rate, regular rhythm and normal heart sounds.  Exam reveals no gallop and no friction rub.   No murmur heard. Pulmonary/Chest: Effort normal and breath sounds normal. She has no wheezes. She has no rales.  Lymphadenopathy:       Head (right side): No submandibular, no preauricular and no posterior auricular adenopathy present.       Head (left side): No submandibular, no preauricular and no posterior auricular adenopathy present.    She has cervical adenopathy.       Right cervical: Superficial cervical adenopathy present.    She has axillary adenopathy.       Right axillary: Lateral adenopathy present.       Left axillary: Lateral adenopathy present.       Right: No supraclavicular adenopathy present.       Left: No supraclavicular adenopathy present.  R posterior cervical node 1 cm diameter mobile.  Neurological: She is alert and oriented to person, place, and time.  Skin: She is not diaphoretic.  Psychiatric: She has a normal mood and affect. Her behavior is normal.        Assessment &  Plan:  LAD (lymphadenopathy), posterior cervical - Plan: CBC with Differential, Culture, Group A Strep, [place] m[place] spotted fvr ab, IgM-blood, Epstein-Barr virus VCA antibody panel  Other malaise and fatigue - Plan: CBC with Differential, Epstein-Barr virus VCA antibody panel  Tick bite of head  Meds ordered this encounter  Medications  . doxycycline (VIBRAMYCIN) 100 MG capsule  Sig: Take 100 mg by mouth 2 (two) times daily.   1.  Posterior R cervical LAD:  New. Associated with recent tick bite; s/p Doxycycline x 10 days; send throat culture, EBV titers, RMSF titers.  If not completely resolved in two weeks RTC. 2. Malaise and fatigue:  New. Associated with cervical posterior LAD; obtain labs; recommend rest, fluids, Ibuprofen. 3.Tick Bite scalp:  New.  Send RMSF titer; s/p doxycycline.  No Follow-up on file.  Reginia Forts, M.D.  [facility] [address] [place], [place]  [address] [phone] phone [phone] fax

## 2014-02-02 LAB — EPSTEIN-BARR VIRUS VCA ANTIBODY PANEL
EBV EA IgG: <5 U/mL (ref <9.0)
EBV NA IgG: 410 U/mL — ABNORMAL HIGH (ref <18.0)
EBV VCA IgG: 25.8 U/mL — ABNORMAL HIGH (ref <18.0)

## 2014-02-02 LAB — ROCKY MTN SPOTTED FVR AB, IGM-BLOOD: ROCKY MTN SPOTTED FEVER, IGM: 0.26 IV

## 2014-02-03 LAB — CULTURE, GROUP A STREP: Organism ID, Bacteria: NORMAL

## 2014-02-04 NOTE — Telephone Encounter (Signed)
Pt is calling about lab results. Labs have not been reviewed yet. Please review so we can call pt. Thanks!

## 2014-02-05 NOTE — Telephone Encounter (Signed)
Patient called once more regarding lab results. Please return call and advise.

## 2014-02-07 ENCOUNTER — Encounter: Payer: Self-pay | Admitting: Family Medicine

## 2014-02-07 NOTE — Telephone Encounter (Signed)
Labs reviewed and sent to lab pool; please call patient back with results.

## 2014-02-15 ENCOUNTER — Telehealth: Payer: Self-pay

## 2014-02-15 DIAGNOSIS — R590 Localized enlarged lymph nodes: Secondary | ICD-10-CM

## 2014-02-15 NOTE — Telephone Encounter (Signed)
I do not see in chart where patient has been seen here.

## 2014-02-15 NOTE — Telephone Encounter (Signed)
Pt saw dr Tamala Julian recently with a swollen lymph node and she still as it and would like to be referred to [facility]

## 2014-02-16 NOTE — Telephone Encounter (Signed)
Pt was seen here. Can we refer?

## 2014-02-16 NOTE — Telephone Encounter (Signed)
Please call ---- I have placed order for referral to [facility]. We should contact her in the upcoming week with an appointment time.

## 2014-02-17 NOTE — Telephone Encounter (Signed)
Advised pt

## 2014-03-08 ENCOUNTER — Ambulatory Visit: Payer: Self-pay | Admitting: Otolaryngology

## 2014-03-11 ENCOUNTER — Encounter: Payer: Self-pay | Admitting: Interventional Cardiology

## 2014-03-15 ENCOUNTER — Ambulatory Visit: Payer: Self-pay | Admitting: Urology

## 2014-03-15 LAB — CBC WITH DIFFERENTIAL/PLATELET
Basophil #: 0 10*3/uL (ref 0.0–0.1)
Basophil %: 0.7 %
Eosinophil #: 0.1 10*3/uL (ref 0.0–0.7)
Eosinophil %: 1.3 %
HCT: 42.3 % (ref 35.0–47.0)
HGB: 14.3 g/dL (ref 12.0–16.0)
LYMPHS ABS: 2 10*3/uL (ref 1.0–3.6)
Lymphocyte %: 31 %
MCH: 31.8 pg (ref 26.0–34.0)
MCHC: 33.8 g/dL (ref 32.0–36.0)
MCV: 94 fL (ref 80–100)
MONOS PCT: 5.1 %
Monocyte #: 0.3 x10 3/mm (ref 0.2–0.9)
Neutrophil #: 4.1 10*3/uL (ref 1.4–6.5)
Neutrophil %: 61.9 %
Platelet: 204 10*3/uL (ref 150–440)
RBC: 4.5 10*6/uL (ref 3.80–5.20)
RDW: 12.2 % (ref 11.5–14.5)
WBC: 6.5 10*3/uL (ref 3.6–11.0)

## 2014-03-15 LAB — APTT: Activated PTT: 30.7 secs (ref 23.6–35.9)

## 2014-03-15 LAB — PROTIME-INR
INR: 1
PROTHROMBIN TIME: 12.9 s (ref 11.5–14.7)

## 2014-03-22 ENCOUNTER — Ambulatory Visit: Payer: Self-pay | Admitting: Urology

## 2014-05-12 ENCOUNTER — Ambulatory Visit: Payer: Self-pay | Admitting: Otolaryngology

## 2014-05-27 HISTORY — PX: TONSILLECTOMY: SUR1361

## 2014-05-27 HISTORY — PX: CYSTOSTOMY W/ BLADDER BIOPSY: SHX1431

## 2014-08-09 ENCOUNTER — Ambulatory Visit: Payer: Self-pay | Admitting: Urology

## 2014-08-25 ENCOUNTER — Ambulatory Visit: Payer: Self-pay

## 2014-08-28 ENCOUNTER — Emergency Department: Payer: Self-pay | Admitting: Emergency Medicine

## 2014-08-28 LAB — COMPREHENSIVE METABOLIC PANEL
ALBUMIN: 3.8 g/dL (ref 3.4–5.0)
Alkaline Phosphatase: 73 U/L
Anion Gap: 8 (ref 7–16)
BUN: 13 mg/dL (ref 7–18)
Bilirubin,Total: 0.5 mg/dL (ref 0.2–1.0)
CHLORIDE: 103 mmol/L (ref 98–107)
CREATININE: 0.67 mg/dL (ref 0.60–1.30)
Calcium, Total: 9 mg/dL (ref 8.5–10.1)
Co2: 28 mmol/L (ref 21–32)
EGFR (African American): >60
EGFR (Non-African Amer.): >60
GLUCOSE: 91 mg/dL (ref 65–99)
OSMOLALITY: 277 (ref 275–301)
POTASSIUM: 4 mmol/L (ref 3.5–5.1)
SGOT(AST): 30 U/L (ref 15–37)
SGPT (ALT): 47 U/L
SODIUM: 139 mmol/L (ref 136–145)
TOTAL PROTEIN: 7.3 g/dL (ref 6.4–8.2)

## 2014-08-28 LAB — URINALYSIS, COMPLETE
Bilirubin,UR: NEGATIVE
Glucose,UR: NEGATIVE mg/dL (ref 0–75)
KETONE: NEGATIVE
LEUKOCYTE ESTERASE: NEGATIVE
Nitrite: NEGATIVE
PH: 6 (ref 4.5–8.0)
Protein: 100
SPECIFIC GRAVITY: 1.005 (ref 1.003–1.030)
Squamous Epithelial: NONE SEEN
WBC UR: 5 /HPF (ref 0–5)

## 2014-08-28 LAB — CBC
HCT: 44 % (ref 35.0–47.0)
HGB: 15.2 g/dL (ref 12.0–16.0)
MCH: 32.3 pg (ref 26.0–34.0)
MCHC: 34.4 g/dL (ref 32.0–36.0)
MCV: 94 fL (ref 80–100)
PLATELETS: 223 10*3/uL (ref 150–440)
RBC: 4.69 10*6/uL (ref 3.80–5.20)
RDW: 11.9 % (ref 11.5–14.5)
WBC: 7.5 10*3/uL (ref 3.6–11.0)

## 2014-10-18 ENCOUNTER — Other Ambulatory Visit: Payer: Self-pay

## 2014-10-18 DIAGNOSIS — Z1231 Encounter for screening mammogram for malignant neoplasm of breast: Secondary | ICD-10-CM

## 2014-11-24 ENCOUNTER — Encounter: Payer: Self-pay | Admitting: Family Medicine

## 2014-11-24 ENCOUNTER — Ambulatory Visit (INDEPENDENT_AMBULATORY_CARE_PROVIDER_SITE_OTHER): Payer: BLUE CROSS/BLUE SHIELD | Admitting: Family Medicine

## 2014-11-24 VITALS — BP 120/80 | HR 71 | Temp 98.1°F | Resp 16 | Ht 65.5 in | Wt 162.8 lb

## 2014-11-24 DIAGNOSIS — N879 Dysplasia of cervix uteri, unspecified: Secondary | ICD-10-CM | POA: Diagnosis not present

## 2014-11-24 DIAGNOSIS — R0789 Other chest pain: Secondary | ICD-10-CM

## 2014-11-24 DIAGNOSIS — Z13220 Encounter for screening for lipoid disorders: Secondary | ICD-10-CM | POA: Diagnosis not present

## 2014-11-24 DIAGNOSIS — Z0000 Encounter for general adult medical examination without abnormal findings: Secondary | ICD-10-CM

## 2014-11-24 DIAGNOSIS — R0989 Other specified symptoms and signs involving the circulatory and respiratory systems: Secondary | ICD-10-CM

## 2014-11-24 DIAGNOSIS — Z1329 Encounter for screening for other suspected endocrine disorder: Secondary | ICD-10-CM | POA: Diagnosis not present

## 2014-11-24 DIAGNOSIS — Z131 Encounter for screening for diabetes mellitus: Secondary | ICD-10-CM

## 2014-11-24 DIAGNOSIS — Z01419 Encounter for gynecological examination (general) (routine) without abnormal findings: Secondary | ICD-10-CM | POA: Diagnosis not present

## 2014-11-24 LAB — CBC WITH DIFFERENTIAL/PLATELET
BASOS ABS: 0.1 10*3/uL (ref 0.0–0.1)
Basophils Relative: 1 % (ref 0–1)
EOS PCT: 2 % (ref 0–5)
Eosinophils Absolute: 0.1 10*3/uL (ref 0.0–0.7)
HCT: 42.4 % (ref 36.0–46.0)
HEMOGLOBIN: 14.2 g/dL (ref 12.0–15.0)
LYMPHS ABS: 1.6 10*3/uL (ref 0.7–4.0)
LYMPHS PCT: 25 % (ref 12–46)
MCH: 31.4 pg (ref 26.0–34.0)
MCHC: 33.5 g/dL (ref 30.0–36.0)
MCV: 93.8 fL (ref 78.0–100.0)
MONOS PCT: 5 % (ref 3–12)
MPV: 11.2 fL (ref 8.6–12.4)
Monocytes Absolute: 0.3 10*3/uL (ref 0.1–1.0)
NEUTROS PCT: 67 % (ref 43–77)
Neutro Abs: 4.2 10*3/uL (ref 1.7–7.7)
PLATELETS: 265 10*3/uL (ref 150–400)
RBC: 4.52 MIL/uL (ref 3.87–5.11)
RDW: 12.4 % (ref 11.5–15.5)
WBC: 6.2 10*3/uL (ref 4.0–10.5)

## 2014-11-24 LAB — POCT URINALYSIS DIPSTICK
BILIRUBIN UA: NEGATIVE
Blood, UA: NEGATIVE
Glucose, UA: NEGATIVE
KETONES UA: NEGATIVE
LEUKOCYTES UA: NEGATIVE
NITRITE UA: NEGATIVE
Protein, UA: NEGATIVE
Spec Grav, UA: 1.02
Urobilinogen, UA: 1
pH, UA: 7

## 2014-11-24 LAB — TSH: TSH: 1.136 u[IU]/mL (ref 0.350–4.500)

## 2014-11-24 LAB — LIPID PANEL
CHOLESTEROL: 200 mg/dL (ref 0–200)
HDL: 44 mg/dL — ABNORMAL LOW (ref >=46)
LDL CALC: 125 mg/dL — AB (ref 0–99)
TRIGLYCERIDES: 154 mg/dL — AB (ref <150)
Total CHOL/HDL Ratio: 4.5 Ratio
VLDL: 31 mg/dL (ref 0–40)

## 2014-11-24 LAB — COMPREHENSIVE METABOLIC PANEL
ALK PHOS: 53 U/L (ref 39–117)
ALT: 22 U/L (ref 0–35)
AST: 15 U/L (ref 0–37)
Albumin: 4.1 g/dL (ref 3.5–5.2)
BILIRUBIN TOTAL: 0.4 mg/dL (ref 0.2–1.2)
BUN: 16 mg/dL (ref 6–23)
CALCIUM: 9 mg/dL (ref 8.4–10.5)
CO2: 24 mEq/L (ref 19–32)
Chloride: 105 mEq/L (ref 96–112)
Creat: 0.66 mg/dL (ref 0.50–1.10)
GLUCOSE: 99 mg/dL (ref 70–99)
POTASSIUM: 4.1 meq/L (ref 3.5–5.3)
SODIUM: 141 meq/L (ref 135–145)
Total Protein: 6.3 g/dL (ref 6.0–8.3)

## 2014-11-24 NOTE — Patient Instructions (Signed)

## 2014-11-24 NOTE — Progress Notes (Signed)
Subjective:    Patient ID: Tracey Weaver, female    DOB: 07/05/1966, 49 y.o.   MRN: [id number]  11/24/2014  Annual Exam   HPI This 49 y.o. female presents for Complete Physical Examination.  Last physical:  11/30/2013 Pap smear:  11/2013 Mammogram: scheduled for next month. 11/2013 Colonoscopy:  2012; due for repeat.  [place]. TDAP:  2009 Influenza: refuses Eye exam:  Scheduled this month; no contact; +glasses; near sighted. Dental exam:  Every six months.   L hip cramping with L foot tingling upon awakening. Onset one month ago. Worried about circulation issues; mother with PAD but long-standing smoker.  Improving since onset by 50%.  Intermittent lower back pain; no weakness in leg.  No b/b dysfunction. No saddle paresthesias.   Interstitial Cystitis:  Bladder biopsy revealed bladder granularis; s/p 3 cystoscopy this year.    Chest pain: L sided; occurs sporadically throughout the day; last week, chest pain recurrent all day long.  No chest pain with exercise. No SOB, nausea, diaphoresis.  Food has not effect.  Strong family history of CAD.     Review of Systems  Constitutional: Negative for fever, chills, diaphoresis, activity change, appetite change, fatigue and unexpected weight change.  HENT: Negative for congestion, dental problem, drooling, ear discharge, ear pain, facial swelling, hearing loss, mouth sores, nosebleeds, postnasal drip, rhinorrhea, sinus pressure, sneezing, sore throat, tinnitus, trouble swallowing and voice change.   Eyes: Negative for photophobia, pain, discharge, redness, itching and visual disturbance.  Respiratory: Negative for apnea, cough, choking, chest tightness, shortness of breath, wheezing and stridor.   Cardiovascular: Positive for chest pain. Negative for palpitations and leg swelling.  Gastrointestinal: Positive for constipation. Negative for nausea, vomiting, abdominal pain, diarrhea, blood in stool, abdominal distention, anal bleeding and rectal  pain.  Endocrine: Negative for cold intolerance, heat intolerance, polydipsia, polyphagia and polyuria.  Genitourinary: Negative for dysuria, urgency, frequency, hematuria, flank pain, decreased urine volume, vaginal bleeding, vaginal discharge, enuresis, difficulty urinating, genital sores, vaginal pain, menstrual problem, pelvic pain and dyspareunia.  Musculoskeletal: Negative for myalgias, back pain, joint swelling, arthralgias, gait problem, neck pain and neck stiffness.  Skin: Negative for color change, pallor, rash and wound.  Allergic/Immunologic: Negative for environmental allergies, food allergies and immunocompromised state.  Neurological: Negative for dizziness, tremors, seizures, syncope, facial asymmetry, speech difficulty, weakness, light-headedness, numbness and headaches.  Hematological: Negative for adenopathy. Does not bruise/bleed easily.  Psychiatric/Behavioral: Negative for suicidal ideas, hallucinations, behavioral problems, confusion, sleep disturbance, self-injury, dysphoric mood, decreased concentration and agitation. The patient is not nervous/anxious and is not hyperactive.     Past Medical History  Diagnosis Date  . Splenomegaly     s/p GI consult and hematology consult  . Pyelonephritis   . Pneumonia   . Ulcer 09/28/2011    EGD: +gastric ulcer; rx for Dexilant.  Tracey Weaver.  . Interstitial cystitis     [facility]; s/p bladder biopsy; s/p cystoscopy x 3 in 2015.  Tracey Weaver Recurrent UTI   . Kidney stones   . Chest pain   . Constipation   . Cervical cancer     precancerous cells  . Dysthymic disorder   . Adjustment disorder with depressed mood   . History of ectopic pregnancy     Multiple  . Liver function study, abnormal   . IBS (irritable bowel syndrome)     Constipation; Rx for Amitiza worked well.  . Degenerative disc disease, cervical   . Dysplastic nevus 08/27/2013    s/p resection by Dr.  Benitez-Graham.  . Anxiety    Past Surgical History  Procedure  Laterality Date  . Esophagogastroduodenoscopy  09/28/2011    +gastric ulcer.  Tracey Weaver.  . Cesarean section      x 2.  . Colonoscopy  08/27/2010    Tracey Weaver.    . Tonsillectomy  05/27/2014    [facility].  . Cystostomy w/ bladder biopsy  05/27/2014    Tracey Weaver. Benign.  . Abdominal hysterectomy  08/27/1998    cervical dysplasia;one ovary remaining   No Known Allergies Current Outpatient Prescriptions  Medication Sig Dispense Refill  . lubiprostone (AMITIZA) 8 MCG capsule Take 1 capsule (8 mcg total) by mouth 2 (two) times daily with a meal. 60 capsule 11   No current facility-administered medications for this visit.   History   Social History  . Marital Status: Married    Spouse Name: N/A  . Number of Children: 2  . Years of Education: 14   Occupational History  . hairstylist    Social History Main Topics  . Smoking status: Never Smoker   . Smokeless tobacco: Not on file  . Alcohol Use: Yes     Comment: 1-2 drinks per month  . Drug Use: No  . Sexual Activity:    Partners: Male   Other Topics Concern  . 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      Tobacco:  None      Alcohol: 1-2 servings per month.      Drugs:  none      Exercise:  none      Caffeine use: Coffee, 1 serving per day   Always uses seat belts, smoke alarm and carbon monoxide detector in the home, Guns locked up.   Organ Donor: YES   Patient DOES not have living will, DOES not have HCPOA.            Family History  Problem Relation Age of Onset  . Hypertension Mother   . COPD Mother   . Hyperlipidemia Mother   . Alcohol abuse Mother   . Colon polyps Mother   . Heart attack Mother   . Heart disease Mother 77    AMI x 2; stents.  PAD s/p stenting.  . Cancer Father     prostate cancer with mets  . Diabetes Father     . Heart disease Father 67    AMI age 89  . Hyperlipidemia Father   . Hypertension Father   . Mental illness Brother     paranoid schizophrenia  . Hypothyroidism Brother   . Hyperlipidemia Brother   . Epilepsy Son   . Diabetes Son   . Stroke Maternal Grandmother   . Heart disease Maternal Grandmother   . Hypertension Maternal Grandmother   . Stroke Maternal Grandfather   . Heart disease Maternal Grandfather   . Hypertension Maternal Grandfather   . Cancer Paternal Grandmother   . Heart disease Paternal Grandmother   . Diabetes Paternal Grandmother   . Stroke Paternal Grandfather   . Heart disease Paternal Grandfather   . Cancer Brother 40    lung, germ cell; lobectomy at 106  . Drug abuse Brother   . Hypertension Brother        Objective:  BP 120/80 mmHg  Pulse 71  Temp(Src) 98.1 F (36.7 C) (Oral)  Resp 16  Ht 5' 5.5" (1.664 m)  Wt 162 lb 12.8 oz (73.846 kg)  BMI 26.67 kg/m2  SpO2 100% Physical Exam  Constitutional: She is oriented to person, place, and time. She appears well-developed and well-nourished. No distress.  HENT:  Head: Normocephalic and atraumatic.  Right Ear: External ear normal.  Left Ear: External ear normal.  Nose: Nose normal.  Mouth/Throat: Oropharynx is clear and moist.  Eyes: Conjunctivae and EOM are normal. Pupils are equal, round, and reactive to light.  Neck: Normal range of motion and full passive range of motion without pain. Neck supple. No JVD present. Carotid bruit is present. No thyromegaly present.  R carotid bruit high pitched.  Cardiovascular: Normal rate, regular rhythm and normal heart sounds.  Exam reveals no gallop and no friction rub.   No murmur heard. Pulmonary/Chest: Effort normal and breath sounds normal. She has no wheezes. She has no rales. Right breast exhibits no inverted nipple, no mass, no nipple discharge, no skin change and no tenderness. Left breast exhibits no inverted nipple, no mass, no nipple discharge, no  skin change and no tenderness. Breasts are symmetrical.  Abdominal: Soft. Bowel sounds are normal. She exhibits no distension and no mass. There is no tenderness. There is no rebound and no guarding.  Genitourinary: Vagina normal. There is no rash, tenderness or lesion on the right labia. There is no rash, tenderness or lesion on the left labia. Right adnexum displays no mass, no tenderness and no fullness. Left adnexum displays no mass, no tenderness and no fullness.  Musculoskeletal:       Right shoulder: Normal.       Left shoulder: Normal.       Cervical back: Normal.  Lymphadenopathy:    She has no cervical adenopathy.  Neurological: She is alert and oriented to person, place, and time. She has normal reflexes. No cranial nerve deficit. She exhibits normal muscle tone. Coordination normal.  Skin: Skin is warm and dry. No rash noted. She is not diaphoretic. No erythema. No pallor.  Psychiatric: She has a normal mood and affect. Her behavior is normal. Judgment and thought content normal.  Nursing note and vitals reviewed.  EKG: NSR; no ST changes.     Assessment & Plan:   1. Routine physical examination   2. Screening for diabetes mellitus   3. Screening, lipid   4. Screening for thyroid disorder   5. Other chest pain   6. Right carotid bruit   7. Cervical dysplasia     1. Complete Physical Examination: anticipatory guidance ---exercise, weight loss, 3 servings of calcium daily. Pap smear obtained due to cervical dysplasia.  Pt scheduled for repeat mammogram.  Colonoscopy due and patient to reschedule.  Immunizations reviewed; pt refuses flu vaccine.  2. Screening DMII: obtain glucose, HgbA1c. 3.  Screening lipid: obtain FLP. 4. Screening thyroid: obtain TSH. 5.  Chest pain: atypical; stable EKG; strong family history of early CAD; refer to cardiology for further evaluation. 6. Carotid bruit R: New.  Refer to cardiology for carotid doppler.  Obtain FLP. 7. Cervical dysplasia:  s/p hysterectomy; obtain pap smear.    No orders of the defined types were placed in this encounter.    Return in about 1 year (around 11/24/2015) for complete physical examiniation.     Daeja Helderman Elayne Guerin, M.D. [facility] [place], [place]  [address] [phone] phone [phone] fax

## 2014-11-25 LAB — HEMOGLOBIN A1C
Hgb A1c MFr Bld: 5.3 % (ref <5.7)
Mean Plasma Glucose: 105 mg/dL (ref <117)

## 2014-11-25 LAB — PAP IG (IMAGE GUIDED)

## 2014-12-01 ENCOUNTER — Telehealth: Payer: Self-pay

## 2014-12-01 NOTE — Telephone Encounter (Signed)
Pt called about labs. Unable to get in [place]. Lab message relayed to pt

## 2014-12-15 ENCOUNTER — Emergency Department
Admit: 2014-12-15 | Disposition: A | Discharge status: Home / Self Care | Payer: Self-pay | Admitting: Emergency Medicine

## 2014-12-15 LAB — URINALYSIS, COMPLETE
Bilirubin,UR: NEGATIVE
Blood: NEGATIVE
Glucose,UR: NEGATIVE mg/dL (ref 0–75)
Leukocyte Esterase: NEGATIVE
NITRITE: NEGATIVE
Ph: 5 (ref 4.5–8.0)
Protein: NEGATIVE
SPECIFIC GRAVITY: 1.024 (ref 1.003–1.030)

## 2014-12-15 LAB — COMPREHENSIVE METABOLIC PANEL
ALBUMIN: 4.2 g/dL
ALK PHOS: 54 U/L
ANION GAP: 5 — AB (ref 7–16)
BILIRUBIN TOTAL: 0.7 mg/dL
BUN: 14 mg/dL
Calcium, Total: 9.1 mg/dL
Chloride: 106 mmol/L
Co2: 30 mmol/L
Creatinine: 0.71 mg/dL
GLUCOSE: 84 mg/dL
Potassium: 3.5 mmol/L
SGOT(AST): 22 U/L
SGPT (ALT): 32 U/L
Sodium: 141 mmol/L
Total Protein: 6.9 g/dL

## 2014-12-15 LAB — CBC WITH DIFFERENTIAL/PLATELET
BASOS ABS: 0.1 10*3/uL (ref 0.0–0.1)
Basophil %: 1 %
EOS PCT: 2.6 %
Eosinophil #: 0.1 10*3/uL (ref 0.0–0.7)
HCT: 43.3 % (ref 35.0–47.0)
HGB: 14.9 g/dL (ref 12.0–16.0)
Lymphocyte #: 1.9 10*3/uL (ref 1.0–3.6)
Lymphocyte %: 35.4 %
MCH: 32 pg (ref 26.0–34.0)
MCHC: 34.5 g/dL (ref 32.0–36.0)
MCV: 93 fL (ref 80–100)
MONO ABS: 0.3 x10 3/mm (ref 0.2–0.9)
MONOS PCT: 5.7 %
NEUTROS ABS: 3 10*3/uL (ref 1.4–6.5)
Neutrophil %: 55.3 %
PLATELETS: 202 10*3/uL (ref 150–440)
RBC: 4.66 10*6/uL (ref 3.80–5.20)
RDW: 12.7 % (ref 11.5–14.5)
WBC: 5.4 10*3/uL (ref 3.6–11.0)

## 2014-12-18 NOTE — Op Note (Signed)
PATIENT NAME:  Tracey Weaver, SLIDER MR#:  [id number] DATE OF BIRTH:  Jan 10, 1966  DATE OF PROCEDURE:  03/22/2014  PREOPERATIVE DIAGNOSIS: Hematuria.  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   PROCEDURE: Cystoscopy.   ANESTHESIA: General.   COMPLICATIONS: None.   The was patient sterilely prepped and draped in supine lithotomy position for ease of approach to the external genitalia. I began the procedure after an appropriate timeout. She has had preoperative antibiotics, Cipro 100 mg. A 21-French cystoscope sheath with 4.0 oblique lens was utilized. Cobblestone estrogen appearance to the trigone, which looked inflamed underneath seen. The rest of the bladder showed no trabeculation tumor, mass or growth. The area was more of an inflammatory situation with the urethra also showing some inflammation. The ureters are in their proper position with stadium-like orifices. No reflux is apparent. I see no evidences of carcinoma in situ or carcinoma. Bimanual exam is negative. Rectal exam is negative. At the end of the procedure, the patient's bladder was emptied. Rectal exam was done, as I put in a B and O suppository and then the bladder had 30 mL of 0.5% plain Marcaine placed in it for postoperative discomfort. She is sent to recovery in satisfactory condition.   ____________________________ Janice Coffin. [name], DO rdh:lm D: 03/22/2014 13:50:00 ET T: 03/23/2014 01:21:30 ET JOB#: [id number]  cc: Janice Coffin. [name], DO, <Dictator> Jourdain Guay D Johntae Broxterman DO ELECTRONICALLY SIGNED 03/25/2014 14:07

## 2014-12-18 NOTE — Op Note (Signed)
PATIENT NAME:  Tracey, Weaver MR#:  [id number] DATE OF BIRTH:  1965/09/09  DATE OF PROCEDURE:  08/09/2014  PREOPERATIVE DIAGNOSIS: Possible bladder tumor.   POSTOPERATIVE DIAGNOSIS: Possible bladder tumor.   PROCEDURE: Cystoscopy, biopsy of trigone where the suspected tumor was.     SURGEON: Ronae Noell D. [name], DO.   ANESTHESIA: General.   COMPLICATIONS: None.   BLOOD LOSS: None.   PROCEDURE IN DETAIL: With the patient sterilely prepped and draped in supine lithotomy position, after an appropriate timeout and with good relaxation from a general anesthetic, the procedure was begun utilizing camera and the cystoscope. I view the bladder. The trigone is greatly improved over what it looked like several months ago, but still there are areas of suspicion for possible early carcinoma in situ. So I biopsy the trigone area of interest and suspicion. This is done with the cold cutting forceps and then utilizing the Bugbee electrode I cauterize the area so that there is no bleeding. Bladder is emptied and 30 mL of 0.5% Marcaine are placed in the bladder. The B and O suppository is placed in the rectum.   FINDINGS: The rest of the bladder showed no areas of suspicion, ureters in a normal position, clear efflux of urine, stadium-like orifices. There was no trabeculation, gross large tumor mass, or calculi within the bladder. Rectal exam is negative for rectal masses, tumors, or growths.    ____________________________ Tracey Weaver. [name], DO rdh:bu D: 08/09/2014 14:38:04 ET T: 08/09/2014 16:01:29 ET JOB#: [id number]  cc: Tracey Weaver. [name], DO, <Dictator> Axie Hayne D Havilah Topor DO ELECTRONICALLY SIGNED 08/11/2014 13:12

## 2014-12-20 LAB — SURGICAL PATHOLOGY

## 2014-12-27 ENCOUNTER — Ambulatory Visit: Payer: Self-pay

## 2015-01-05 ENCOUNTER — Ambulatory Visit
Admission: RE | Admit: 2015-01-05 | Discharge: 2015-01-05 | Disposition: A | Discharge status: Home / Self Care | Payer: BLUE CROSS/BLUE SHIELD | Source: Ambulatory Visit

## 2015-01-05 DIAGNOSIS — Z1231 Encounter for screening mammogram for malignant neoplasm of breast: Secondary | ICD-10-CM

## 2015-01-11 ENCOUNTER — Ambulatory Visit (INDEPENDENT_AMBULATORY_CARE_PROVIDER_SITE_OTHER): Payer: BLUE CROSS/BLUE SHIELD | Admitting: Cardiology

## 2015-01-11 ENCOUNTER — Encounter: Payer: Self-pay | Admitting: Cardiology

## 2015-01-11 VITALS — BP 120/80 | HR 93 | Ht 65.5 in | Wt 162.8 lb

## 2015-01-11 DIAGNOSIS — R079 Chest pain, unspecified: Secondary | ICD-10-CM | POA: Diagnosis not present

## 2015-01-11 NOTE — Patient Instructions (Signed)
Medication Instructions:  Your physician recommends that you continue on your current medications as directed. Please refer to the Current Medication list given to you today.   Labwork: None  Testing/Procedures: Your physician has requested that you have a stress echocardiogram. For further information please visit [email or web address]. Please follow instruction sheet as given.  Follow-Up: Your physician recommends that you schedule a follow-up appointment AS NEEDED with Dr. Radford Pax pending your stress ECHO results.  Any Other Special Instructions Will Be Listed Below (If Applicable).

## 2015-01-11 NOTE — Progress Notes (Signed)
Cardiology Office Note   Date:  01/11/2015   ID:  DILLON MCREYNOLDS, DOB Jan 29, 1966, MRN [id number]  PCP:  Reginia Forts, MD    Chief Complaint  Patient presents with  . New Evaluation    Chest Pain      History of Present Illness: Tracey Weaver is a 49 y.o. female who presents for evaluation of chest pain.  SHe was seen in Urgent Care in March and was complaining of left sided chest pain that occurs sporadically throughout the day.  She may go a month and not have any and then it will occur again.  It is usually off and on all day.  It is nonexertional.  There is no associated SOB or radiation.  She does occasionally have diaphoresis and nausea with her CP.  She has a family history of CAD but has never smoked.  She says that her PCP thought she had a murmur in her carotid artery.  She also was having a pain in her left groin that is episodic and had a LE venous doppler that was negative.      Past Medical History  Diagnosis Date  . Splenomegaly     s/p GI consult and hematology consult  . Pyelonephritis   . Pneumonia   . Ulcer 09/28/2011    EGD: +gastric ulcer; rx for Dexilant.  [name].  . Interstitial cystitis     [facility]; s/p bladder biopsy; s/p cystoscopy x 3 in 2015.  [name] Recurrent UTI   . Kidney stones   . Chest pain   . Constipation   . Cervical cancer     precancerous cells  . Dysthymic disorder   . Adjustment disorder with depressed mood   . History of ectopic pregnancy     Multiple  . Liver function study, abnormal   . IBS (irritable bowel syndrome)     Constipation; Rx for Amitiza worked well.  . Degenerative disc disease, cervical   . Dysplastic nevus 08/27/2013    s/p resection by Dr. Aubery Lapping.  . Anxiety     Past Surgical History  Procedure Laterality Date  . Esophagogastroduodenoscopy  09/28/2011    +gastric ulcer.  [name].  . Cesarean section      x 2.  . Colonoscopy  08/27/2010    [name].    . Tonsillectomy  05/27/2014    [facility].  .  Cystostomy w/ bladder biopsy  05/27/2014    [name]. Benign.  . Abdominal hysterectomy  08/27/1998    cervical dysplasia;one ovary remaining     Current Outpatient Prescriptions  Medication Sig Dispense Refill  . lubiprostone (AMITIZA) 8 MCG capsule Take 1 capsule (8 mcg total) by mouth 2 (two) times daily with a meal. 60 capsule 11   No current facility-administered medications for this visit.    Allergies:   Meloxicam    Social History:  The patient  reports that she has never smoked. She does not have any smokeless tobacco history on file. She reports that she drinks alcohol. She reports that she does not use illicit drugs.   Family History:  The patient's family history includes Alcohol abuse in her mother; COPD in her mother; Cancer in her father and paternal grandmother; Cancer (age of onset: 80) in her brother; Colon polyps in her mother; Diabetes in her father, paternal grandmother, and son; Drug abuse in her brother; Epilepsy in her son; Heart attack in her mother; Heart disease in her maternal grandfather, maternal grandmother, paternal grandfather, and paternal  grandmother; Heart disease (age of onset: 65) in her mother; Heart disease (age of onset: 80) in her father; Hyperlipidemia in her brother, father, and mother; Hypertension in her brother, father, maternal grandfather, maternal grandmother, and mother; Hypothyroidism in her brother; Mental illness in her brother; Stroke in her maternal grandfather, maternal grandmother, and paternal grandfather.    ROS:  Please see the history of present illness.   Otherwise, review of systems are positive for none.   All other systems are reviewed and negative.    PHYSICAL EXAM: VS:  BP 120/80 mmHg  Pulse 93  Ht 5' 5.5" (1.664 m)  Wt 162 lb 12.8 oz (73.846 kg)  BMI 26.67 kg/m2  SpO2 96% , BMI Body mass index is 26.67 kg/(m^2). GEN: Well nourished, well developed, in no acute distress HEENT: normal Neck: no JVD, carotid bruits, or  masses Cardiac: RRR; no murmurs, rubs, or gallops,no edema  Respiratory:  clear to auscultation bilaterally, normal work of breathing GI: soft, nontender, nondistended, + BS MS: no deformity or atrophy Skin: warm and dry, no rash Neuro:  Strength and sensation are intact Psych: euthymic mood, full affect   EKG:  EKG is not ordered today.    Recent Labs: 11/24/2014: TSH 1.136 12/15/2014: ALT 32; BUN 14; Creatinine 0.71; Hemoglobin 14.9; Platelets 202; Potassium 3.5; Sodium 141    Lipid Panel    Component Value Date/Time   CHOL 200 11/24/2014 1510   TRIG 154* 11/24/2014 1510   HDL 44* 11/24/2014 1510   CHOLHDL 4.5 11/24/2014 1510   VLDL 31 11/24/2014 1510   LDLCALC 125* 11/24/2014 1510      Wt Readings from Last 3 Encounters:  01/11/15 162 lb 12.8 oz (73.846 kg)  11/24/14 162 lb 12.8 oz (73.846 kg)  02/01/14 164 lb (74.39 kg)        ASSESSMENT AND PLAN:  1.  Chest pain with typical and atypical components.  It is nonexertional but is associated with nausea and diaphoresis on occasion.  She has a family history of CAD at an early age in both her parents but they were heavy smokers.  Her EKG is normal.  I have recommended a stress echo to rule out ischemia.   Current medicines are reviewed at length with the patient today.  The patient does not have concerns regarding medicines.  The following changes have been made:  no change  Labs/ tests ordered today include: see above assessment and plan No orders of the defined types were placed in this encounter.     Disposition:   FU with me PRN pending results of studies   SignedSueanne Margarita, MD  01/11/2015 11:12 AM    [address], [place], [place]  [address] Phone: [phone]; Fax: [phone]

## 2015-01-17 ENCOUNTER — Other Ambulatory Visit (HOSPITAL_COMMUNITY): Payer: BLUE CROSS/BLUE SHIELD

## 2015-01-25 ENCOUNTER — Telehealth (HOSPITAL_COMMUNITY): Payer: Self-pay | Admitting: *Deleted

## 2015-01-25 NOTE — Telephone Encounter (Signed)
Patient given detailed instructions per Myocardial Perfusion Study Information Sheet for test on 01/26/15 at 0730. Patient verbalized understanding. Tracey Weaver W    

## 2015-01-26 ENCOUNTER — Ambulatory Visit (HOSPITAL_BASED_OUTPATIENT_CLINIC_OR_DEPARTMENT_OTHER): Payer: BLUE CROSS/BLUE SHIELD

## 2015-01-26 ENCOUNTER — Ambulatory Visit (HOSPITAL_COMMUNITY): Payer: BLUE CROSS/BLUE SHIELD | Attending: Cardiovascular Disease

## 2015-01-26 DIAGNOSIS — R079 Chest pain, unspecified: Secondary | ICD-10-CM

## 2015-02-03 ENCOUNTER — Telehealth: Payer: Self-pay | Admitting: Cardiology

## 2015-02-03 NOTE — Telephone Encounter (Signed)
Informed patient that her normal results were released to MyChart. Patient is grateful for callback.

## 2015-02-03 NOTE — Telephone Encounter (Signed)
New message ° ° ° ° °Want stress test results °

## 2015-02-07 ENCOUNTER — Telehealth: Payer: Self-pay

## 2015-02-07 NOTE — Telephone Encounter (Signed)
Patient left a message stating she had been to see a cardiologist in regards to her pain in the crease of her leg. Per patient the cardiologist informed her she would need to see a surgeon. She was told it may be a hernia or scar tissue that would need to be removed. She is requesting our office to put in a referral for her and her call back number is [phone]

## 2015-02-08 NOTE — Telephone Encounter (Signed)
Call for clarification --- I do not see anything in Dr. Theodosia Blender note regarding patient needing referral to a surgeon.  Was Dr. Radford Pax recommending a vascular surgeon, general surgeon, or orthopedic surgeon?

## 2015-02-09 NOTE — Telephone Encounter (Signed)
Recommend reevaluation by me in office; we can then refer her to appropriate specialist.

## 2015-02-09 NOTE — Telephone Encounter (Signed)
Spoke with pt, she states Dr. Radford Pax wanted her to go to a general surgeon. She states she thinks she has a hernia or scar tissue where she is having pain.. She states her veins and her circulation are good so she feels confident it is not vascular.

## 2015-02-09 NOTE — Telephone Encounter (Signed)
Spoke with pt, advised message from Dr. Tamala Julian. Pt understood.

## 2015-02-13 ENCOUNTER — Ambulatory Visit (INDEPENDENT_AMBULATORY_CARE_PROVIDER_SITE_OTHER): Payer: BLUE CROSS/BLUE SHIELD | Admitting: Family Medicine

## 2015-02-13 VITALS — BP 122/80 | HR 82 | Temp 98.1°F | Resp 16 | Ht 66.0 in | Wt 162.2 lb

## 2015-02-13 DIAGNOSIS — R102 Pelvic and perineal pain: Secondary | ICD-10-CM | POA: Diagnosis not present

## 2015-02-13 LAB — POCT URINALYSIS DIPSTICK
Bilirubin, UA: NEGATIVE
Glucose, UA: NEGATIVE
Ketones, UA: NEGATIVE
Leukocytes, UA: NEGATIVE
Nitrite, UA: NEGATIVE
PROTEIN UA: NEGATIVE
RBC UA: NEGATIVE
SPEC GRAV UA: 1.015
UROBILINOGEN UA: 0.2
pH, UA: 6.5

## 2015-02-13 LAB — POCT UA - MICROSCOPIC ONLY
Casts, Ur, LPF, POC: NEGATIVE
Crystals, Ur, HPF, POC: NEGATIVE
MUCUS UA: NEGATIVE
RBC, urine, microscopic: NEGATIVE
Yeast, UA: NEGATIVE

## 2015-02-13 MED ORDER — NAPROXEN 500 MG PO TABS: 500.0000 mg | ORAL_TABLET | Freq: Two times a day (BID) | ORAL | Status: DC

## 2015-02-13 NOTE — Progress Notes (Signed)
Subjective:    Patient ID: Tracey Weaver, female    DOB: 06-Jul-1966, 49 y.o.   MRN: [id number]  02/13/2015  Abdominal Pain   HPI This 49 y.o. female presents for evaluation of L groin pain.  Discussed pain at CPE at last visit. Two weeks after CPE, pain worsened.  Pain kept up all night; presented to ED on 12/15/14; s/p US doppler; no DVT. Rx for Flexeril 10mg  PRN.  Etiology unclear.  Possible muscle spasm?  No injury or overuse. Took muscle relaxer with improvement.  Ongoing pain since then.  Will take 1/2 Flexeril; has taken five Flexeril total.    Underwent evaluation by Dr. Radford Pax; did not feel arterial etiology.  Thought be scar tissue or hernia.  Did have surgery with lower transverse scar; this made sense to patient.  Cardiology recommended general surgery consultation.  Sharp cramping; charlie horse.  Has occurred three times this morning; sharp pain.  Not daily pain.  Will occur after prolonged standing.  No back pain.  No tingling or numbness in legs.  No radiation into leg.  If standing on feet, occurs every 30 minutes; duration of pain 6 seconds.  Recurrent pain.  Bending over does not bring on; walking up stairs does not trigger.  Went on motorcycle ride yesterday; no pain.  Takes breath away.  No limping.  No urinary symptoms.  Started taking [place] herbal supplement for constipation which is really helping.  Now having daily bowel movement; no bloody stools.  S/p 2 C-sections as well; s/p exploratory laparotomy.  With walking, feels knotting up.  Colonoscopy last 5 years ago; due for repeat.  History of kidney stones years ago; last stone was 10 years ago. S/p hysterectomy with L oophorectomy.  Review of Systems  Constitutional: Negative for chills, diaphoresis, activity change, appetite change and fatigue.  Gastrointestinal: Negative for nausea, vomiting, abdominal pain, diarrhea, constipation, blood in stool, abdominal distention, anal bleeding and rectal pain.    Genitourinary: Positive for pelvic pain. Negative for dysuria, urgency, frequency, hematuria, flank pain, difficulty urinating, genital sores and vaginal pain.  Skin: Negative for rash.    Past Medical History  Diagnosis Date  . Splenomegaly     s/p GI consult and hematology consult  . Pyelonephritis   . Pneumonia   . Ulcer 09/28/2011    EGD: +gastric ulcer; rx for Dexilant.  [name].  . Interstitial cystitis     [facility]; s/p bladder biopsy; s/p cystoscopy x 3 in 2015.  [name] Recurrent UTI   . Kidney stones   . Chest pain   . Constipation   . Cervical cancer     precancerous cells  . Dysthymic disorder   . Adjustment disorder with depressed mood   . History of ectopic pregnancy     Multiple  . Liver function study, abnormal   . IBS (irritable bowel syndrome)     Constipation; Rx for Amitiza worked well.  . Degenerative disc disease, cervical   . Dysplastic nevus 08/27/2013    s/p resection by Dr. Aubery Lapping.  . Anxiety    Past Surgical History  Procedure Laterality Date  . Esophagogastroduodenoscopy  09/28/2011    +gastric ulcer.  [name].  . Cesarean section      x 2.  . Colonoscopy  08/27/2010    [name].    . Tonsillectomy  05/27/2014    [facility].  . Cystostomy w/ bladder biopsy  05/27/2014    [name]. Benign.  . Abdominal hysterectomy  08/27/1998  cervical dysplasia;one ovary remaining   Allergies  Allergen Reactions  . Meloxicam Other (See Comments)    "MAKES ME FEEL FUNNY", Dizziness   Current Outpatient Prescriptions  Medication Sig Dispense Refill  . lubiprostone (AMITIZA) 8 MCG capsule Take 1 capsule (8 mcg total) by mouth 2 (two) times daily with a meal. 60 capsule 11  . naproxen (NAPROSYN) 500 MG tablet Take 1 tablet (500 mg total) by mouth 2 (two) times daily with a meal. 60 tablet 0   No current facility-administered medications for this visit.       Objective:    BP 122/80 mmHg  Pulse 82  Temp(Src) 98.1 F (36.7 C) (Oral)  Resp 16  Ht 5\' 6"   (1.676 m)  Wt 162 lb 3.2 oz (73.573 kg)  BMI 26.19 kg/m2  SpO2 99% Physical Exam  Constitutional: She is oriented to person, place, and time. She appears well-developed and well-nourished. No distress.  HENT:  Head: Normocephalic and atraumatic.  Eyes: Conjunctivae are normal. Pupils are equal, round, and reactive to light.  Neck: Normal range of motion. Neck supple.  Cardiovascular: Normal rate, regular rhythm and normal heart sounds.  Exam reveals no gallop and no friction rub.   No murmur heard. Pulmonary/Chest: Effort normal and breath sounds normal. She has no wheezes. She has no rales.  Abdominal: Soft. Bowel sounds are normal. She exhibits no distension and no mass. There is no tenderness. There is no rebound and no guarding.  No hernia.  +TTP along L lateral edge of scar/incision of lower abdomen.  Musculoskeletal:       Left hip: Normal. She exhibits normal range of motion, normal strength, no tenderness, no bony tenderness and no swelling.       Lumbar back: Normal. She exhibits normal range of motion, no tenderness, no bony tenderness, no pain and no spasm.  Neurological: She is alert and oriented to person, place, and time.  Skin: Skin is warm and dry. No rash noted. She is not diaphoretic. No erythema.  Psychiatric: She has a normal mood and affect. Her behavior is normal.  Nursing note and vitals reviewed.   Results for orders placed or performed in visit on 02/13/15  POCT urinalysis dipstick  Result Value Ref Range   Color, UA yellow    Clarity, UA clear    Glucose, UA neg    Bilirubin, UA neg    Ketones, UA neg    Spec Grav, UA 1.015    Blood, UA neg    pH, UA 6.5    Protein, UA neg    Urobilinogen, UA 0.2    Nitrite, UA neg    Leukocytes, UA Negative Negative  POCT UA - Microscopic Only  Result Value Ref Range   WBC, Ur, HPF, POC 0-1    RBC, urine, microscopic neg    Bacteria, U Microscopic few    Mucus, UA neg    Epithelial cells, urine per micros 4-6      Crystals, Ur, HPF, POC neg    Casts, Ur, LPF, POC neg    Yeast, UA neg        Assessment & Plan:   1. Pelvic pain in female    -Worsening. -Refer to general surgery to rule out hernia or scar tissue induced pain. -Rx for Naproxen 500mg  bid to take scheduled for two to four weeks to treat any underlying nerve inflammation. -Recommend starting Flexeril 10mg  1/2 qhs for two weeks and then PRN. -Obtain urine to evaluate for  hematuria with history of nephrolithiasis.  Meds ordered this encounter  Medications  . naproxen (NAPROSYN) 500 MG tablet    Sig: Take 1 tablet (500 mg total) by mouth 2 (two) times daily with a meal.    Dispense:  60 tablet    Refill:  0    No Follow-up on file.    Savannah Morford Elayne Guerin, M.D. [facility] [address] [place], [place]  [phone] phone [phone] fax

## 2015-02-13 NOTE — Patient Instructions (Signed)
1. Take Flexeril/cyclobenzaprine 1/2 tablet at bedtime for two weeks. 2. Take Naproxen 500mg  one tablet twice daily for two weeks. 3.  My referrals coordinator will contact you in the upcoming 1-2 weeks with general surgery appointment.

## 2015-02-24 ENCOUNTER — Ambulatory Visit (INDEPENDENT_AMBULATORY_CARE_PROVIDER_SITE_OTHER): Payer: BLUE CROSS/BLUE SHIELD | Admitting: General Surgery

## 2015-02-24 ENCOUNTER — Encounter: Payer: Self-pay | Admitting: General Surgery

## 2015-02-24 VITALS — BP 128/82 | HR 80 | Resp 12 | Ht 66.0 in | Wt 162.0 lb

## 2015-02-24 DIAGNOSIS — R1030 Lower abdominal pain, unspecified: Secondary | ICD-10-CM | POA: Diagnosis not present

## 2015-02-24 DIAGNOSIS — R1032 Left lower quadrant pain: Secondary | ICD-10-CM

## 2015-02-24 NOTE — Progress Notes (Signed)
Patient ID: Tracey Weaver, female   DOB: 16-Mar-1966, 49 y.o.   MRN: [id number]  Chief Complaint  Patient presents with  . Other    left groin pain    HPI Tracey Weaver is a 49 y.o. female.  Here for evaluation of lower left groin pain. She states it started around April 2016 and she went to the ED, they gave her muscle relaxants. She states the pain is not every day and on the days that she has the pain it comes and goes and is sharp lasting 30 seconds. Denies any trauma to the area. She does have a history of abdominal surgery and wonders if it is scar tissue or a hernia. 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 Not associated with any activities.  HPI  Past Medical History  Diagnosis Date  . Splenomegaly     s/p GI consult and hematology consult  . Pyelonephritis   . Pneumonia   . Ulcer 09/28/2011    EGD: +gastric ulcer; rx for Dexilant.  [name].  . Interstitial cystitis     [facility]; s/p bladder biopsy; s/p cystoscopy x 3 in 2015.  [name] Recurrent UTI   . Kidney stones   . Chest pain   . Constipation   . Cervical cancer     precancerous cells  . Dysthymic disorder   . Adjustment disorder with depressed mood   . History of ectopic pregnancy     Multiple  . Liver function study, abnormal   . IBS (irritable bowel syndrome)     Constipation; Rx for Amitiza worked well.  . Degenerative disc disease, cervical   . Dysplastic nevus 08/27/2013    s/p resection by Dr. Aubery Lapping.  . Anxiety     Past Surgical History  Procedure Laterality Date  . Esophagogastroduodenoscopy  09/28/2011    +gastric ulcer.  [name].  . Cesarean section      x 2.  . Colonoscopy  08/27/2010    [name].    . Tonsillectomy  05/27/2014    [facility].  . Cystostomy w/ bladder biopsy  05/27/2014    [name]. Benign.  . Abdominal hysterectomy  08/27/1998    cervical dysplasia;one ovary remaining  . Abdominal exploration surgery  1997    ruptured tube from eptopic pregnancy    Family History   Problem Relation Age of Onset  . Hypertension Mother   . COPD Mother   . Hyperlipidemia Mother   . Alcohol abuse Mother   . Colon polyps Mother   . Heart attack Mother   . Heart disease Mother 38    AMI x 2; stents.  PAD s/p stenting.  . Cancer Father     prostate cancer with mets  . Diabetes Father   . Heart disease Father 37    AMI age 52  . Hyperlipidemia Father   . Hypertension Father   . Mental illness Brother     paranoid schizophrenia  . Hypothyroidism Brother   . Hyperlipidemia Brother   . Epilepsy Son   . Diabetes Son   . Stroke Maternal Grandmother   . Heart disease Maternal Grandmother   . Hypertension Maternal Grandmother   . Stroke Maternal Grandfather   . Heart disease Maternal Grandfather   . Hypertension Maternal Grandfather   . Cancer Paternal Grandmother   . Heart disease Paternal Grandmother   . Diabetes Paternal Grandmother   . Stroke Paternal Grandfather   . Heart disease Paternal Grandfather   .  Cancer Brother 40    lung, germ cell; lobectomy at 75  . Drug abuse Brother   . Hypertension Brother     Social History History  Substance Use Topics  . Smoking status: Never Smoker   . Smokeless tobacco: Never Used  . Alcohol Use: 0.0 oz/week    0 Standard drinks or equivalent per week     Comment: 1-2 drinks per month    Allergies  Allergen Reactions  . Meloxicam Other (See Comments)    "MAKES ME FEEL FUNNY", Dizziness    Current Outpatient Prescriptions  Medication Sig Dispense Refill  . naproxen (NAPROSYN) 500 MG tablet Take 1 tablet (500 mg total) by mouth 2 (two) times daily with a meal. 60 tablet 0   No current facility-administered medications for this visit.    Review of Systems Review of Systems  Constitutional: Negative.   Respiratory: Negative.   Cardiovascular: Negative.   Gastrointestinal: Positive for abdominal pain. Negative for nausea, vomiting, diarrhea and constipation.    Blood pressure 128/82, pulse 80, resp.  rate 12, height 5\' 6"  (1.676 m), weight 162 lb (73.483 kg).  Physical Exam Physical Exam  Constitutional: She is oriented to person, place, and time. She appears well-developed and well-nourished.  HENT:  Mouth/Throat: Oropharynx is clear and moist.  Eyes: Conjunctivae are normal. No scleral icterus.  Neck: Neck supple.  Cardiovascular:  Pulses:      Femoral pulses are 2+ on the right side, and 2+ on the left side.      Dorsalis pedis pulses are 2+ on the right side, and 2+ on the left side.  No lower leg edema.  Abdominal: Soft. Normal appearance. There is no hepatosplenomegaly. There is no tenderness. No hernia. Hernia confirmed negative in the right inguinal area and confirmed negative in the left inguinal area.    Musculoskeletal:       Legs: Full range of motion in the left hip. No crepitance.  Lymphadenopathy:    She has no cervical adenopathy.       Right: No inguinal adenopathy present.       Left: No inguinal adenopathy present.  Neurological: She is alert and oriented to person, place, and time.  Skin: Skin is warm and dry.  Psychiatric: She has a normal mood and affect.    Data Reviewed Left lower extremity venous ultrasound from April 2016 reviewed. Normal study.  Assessment    Intermittent inguinal pain, not reproducible.    Plan    Source for her pain is unclear. There is no evidence of vascular compromise. No musculoskeletal trigger points identified. No bony prominence tenderness. No change in bowel, bladder function to suggest intra-abdominal source. At this time, I do not believe additional imaging would be appropriate. She has been encouraged to call should she appreciate a change in her symptoms or desire reassessment.     Follow up as needed.  PCP:  Elmer Ramp M 02/24/2015, 2:26 PM

## 2015-02-25 DIAGNOSIS — R1032 Left lower quadrant pain: Secondary | ICD-10-CM | POA: Insufficient documentation

## 2015-07-05 ENCOUNTER — Other Ambulatory Visit: Payer: Self-pay | Admitting: Otolaryngology

## 2015-07-05 DIAGNOSIS — C8581 Other specified types of non-Hodgkin lymphoma, lymph nodes of head, face, and neck: Secondary | ICD-10-CM

## 2015-07-11 ENCOUNTER — Ambulatory Visit
Admission: RE | Admit: 2015-07-11 | Discharge: 2015-07-11 | Disposition: A | Discharge status: Home / Self Care | Payer: BLUE CROSS/BLUE SHIELD | Source: Ambulatory Visit | Attending: Otolaryngology | Admitting: Otolaryngology

## 2015-07-11 DIAGNOSIS — C8510 Unspecified B-cell lymphoma, unspecified site: Secondary | ICD-10-CM | POA: Diagnosis not present

## 2015-07-11 DIAGNOSIS — C8581 Other specified types of non-Hodgkin lymphoma, lymph nodes of head, face, and neck: Secondary | ICD-10-CM

## 2015-07-11 MED ORDER — IOHEXOL 300 MG/ML  SOLN: 75.0000 mL | Freq: Once | INTRAMUSCULAR | Status: DC | PRN

## 2015-12-27 ENCOUNTER — Other Ambulatory Visit: Payer: Self-pay

## 2015-12-27 DIAGNOSIS — Z1231 Encounter for screening mammogram for malignant neoplasm of breast: Secondary | ICD-10-CM

## 2016-01-05 DIAGNOSIS — N3010 Interstitial cystitis (chronic) without hematuria: Secondary | ICD-10-CM | POA: Diagnosis not present

## 2016-01-05 DIAGNOSIS — N390 Urinary tract infection, site not specified: Secondary | ICD-10-CM | POA: Diagnosis not present

## 2016-01-05 DIAGNOSIS — N3080 Other cystitis without hematuria: Secondary | ICD-10-CM | POA: Diagnosis not present

## 2016-01-05 DIAGNOSIS — Z6825 Body mass index (BMI) 25.0-25.9, adult: Secondary | ICD-10-CM | POA: Diagnosis not present

## 2016-01-17 ENCOUNTER — Ambulatory Visit: Payer: BLUE CROSS/BLUE SHIELD

## 2016-01-25 ENCOUNTER — Ambulatory Visit
Admission: RE | Admit: 2016-01-25 | Discharge: 2016-01-25 | Disposition: A | Discharge status: Home / Self Care | Payer: BLUE CROSS/BLUE SHIELD | Source: Ambulatory Visit

## 2016-01-25 DIAGNOSIS — Z1231 Encounter for screening mammogram for malignant neoplasm of breast: Secondary | ICD-10-CM | POA: Diagnosis not present

## 2016-03-05 DIAGNOSIS — E119 Type 2 diabetes mellitus without complications: Secondary | ICD-10-CM | POA: Diagnosis not present

## 2016-03-05 DIAGNOSIS — Z139 Encounter for screening, unspecified: Secondary | ICD-10-CM | POA: Diagnosis not present

## 2016-03-05 DIAGNOSIS — E039 Hypothyroidism, unspecified: Secondary | ICD-10-CM | POA: Diagnosis not present

## 2016-03-05 DIAGNOSIS — I10 Essential (primary) hypertension: Secondary | ICD-10-CM | POA: Diagnosis not present

## 2016-03-26 DIAGNOSIS — E7800 Pure hypercholesterolemia, unspecified: Secondary | ICD-10-CM | POA: Diagnosis not present

## 2016-03-26 DIAGNOSIS — B354 Tinea corporis: Secondary | ICD-10-CM | POA: Diagnosis not present

## 2016-03-26 DIAGNOSIS — R799 Abnormal finding of blood chemistry, unspecified: Secondary | ICD-10-CM | POA: Diagnosis not present

## 2016-04-02 DIAGNOSIS — M9903 Segmental and somatic dysfunction of lumbar region: Secondary | ICD-10-CM | POA: Diagnosis not present

## 2016-04-02 DIAGNOSIS — M542 Cervicalgia: Secondary | ICD-10-CM | POA: Diagnosis not present

## 2016-04-02 DIAGNOSIS — M9902 Segmental and somatic dysfunction of thoracic region: Secondary | ICD-10-CM | POA: Diagnosis not present

## 2016-04-02 DIAGNOSIS — M9901 Segmental and somatic dysfunction of cervical region: Secondary | ICD-10-CM | POA: Diagnosis not present

## 2016-04-03 DIAGNOSIS — M542 Cervicalgia: Secondary | ICD-10-CM | POA: Diagnosis not present

## 2016-04-03 DIAGNOSIS — M9901 Segmental and somatic dysfunction of cervical region: Secondary | ICD-10-CM | POA: Diagnosis not present

## 2016-04-03 DIAGNOSIS — M9902 Segmental and somatic dysfunction of thoracic region: Secondary | ICD-10-CM | POA: Diagnosis not present

## 2016-04-03 DIAGNOSIS — R590 Localized enlarged lymph nodes: Secondary | ICD-10-CM | POA: Diagnosis not present

## 2016-04-03 DIAGNOSIS — M9903 Segmental and somatic dysfunction of lumbar region: Secondary | ICD-10-CM | POA: Diagnosis not present

## 2016-04-06 ENCOUNTER — Other Ambulatory Visit: Payer: Self-pay | Admitting: Otolaryngology

## 2016-04-06 DIAGNOSIS — M542 Cervicalgia: Secondary | ICD-10-CM | POA: Diagnosis not present

## 2016-04-06 DIAGNOSIS — M9902 Segmental and somatic dysfunction of thoracic region: Secondary | ICD-10-CM | POA: Diagnosis not present

## 2016-04-06 DIAGNOSIS — M9901 Segmental and somatic dysfunction of cervical region: Secondary | ICD-10-CM | POA: Diagnosis not present

## 2016-04-06 DIAGNOSIS — R590 Localized enlarged lymph nodes: Secondary | ICD-10-CM

## 2016-04-06 DIAGNOSIS — M9903 Segmental and somatic dysfunction of lumbar region: Secondary | ICD-10-CM | POA: Diagnosis not present

## 2016-04-09 ENCOUNTER — Other Ambulatory Visit: Payer: Self-pay | Admitting: Radiology

## 2016-04-10 ENCOUNTER — Ambulatory Visit
Admission: RE | Admit: 2016-04-10 | Discharge: 2016-04-10 | Disposition: A | Discharge status: Home / Self Care | Payer: BLUE CROSS/BLUE SHIELD | Source: Ambulatory Visit | Attending: Otolaryngology | Admitting: Otolaryngology

## 2016-04-10 DIAGNOSIS — R590 Localized enlarged lymph nodes: Secondary | ICD-10-CM

## 2016-04-10 DIAGNOSIS — R221 Localized swelling, mass and lump, neck: Secondary | ICD-10-CM | POA: Diagnosis not present

## 2016-04-10 NOTE — Procedures (Signed)
Technically successful US guided FNA of dominant palpable right lateral/posterior cervical lymph node.    EBL: None  No immediate complications.   Ronny Bacon, MD Pager #: [phone]

## 2016-04-16 LAB — CYTOLOGY - NON PAP

## 2016-04-25 ENCOUNTER — Encounter: Payer: Self-pay | Admitting: Interventional Radiology

## 2016-04-25 DIAGNOSIS — R590 Localized enlarged lymph nodes: Secondary | ICD-10-CM | POA: Diagnosis not present

## 2016-07-11 ENCOUNTER — Ambulatory Visit: Payer: BLUE CROSS/BLUE SHIELD | Admitting: Family Medicine

## 2016-07-16 ENCOUNTER — Ambulatory Visit (INDEPENDENT_AMBULATORY_CARE_PROVIDER_SITE_OTHER): Payer: BLUE CROSS/BLUE SHIELD | Admitting: Family Medicine

## 2016-07-16 VITALS — BP 124/80 | HR 78 | Temp 97.8°F | Resp 17 | Ht 66.0 in | Wt 173.0 lb

## 2016-07-16 DIAGNOSIS — M25542 Pain in joints of left hand: Secondary | ICD-10-CM | POA: Diagnosis not present

## 2016-07-16 DIAGNOSIS — M25541 Pain in joints of right hand: Secondary | ICD-10-CM

## 2016-07-16 DIAGNOSIS — M25579 Pain in unspecified ankle and joints of unspecified foot: Secondary | ICD-10-CM

## 2016-07-16 LAB — CBC
HEMATOCRIT: 41.3 % (ref 35.0–45.0)
HEMOGLOBIN: 13.9 g/dL (ref 11.7–15.5)
MCH: 31.8 pg (ref 27.0–33.0)
MCHC: 33.7 g/dL (ref 32.0–36.0)
MCV: 94.5 fL (ref 80.0–100.0)
MPV: 10.7 fL (ref 7.5–12.5)
Platelets: 216 10*3/uL (ref 140–400)
RBC: 4.37 MIL/uL (ref 3.80–5.10)
RDW: 12.3 % (ref 11.0–15.0)
WBC: 5.1 10*3/uL (ref 3.8–10.8)

## 2016-07-16 LAB — TSH: TSH: 1.26 mIU/L

## 2016-07-16 MED ORDER — DICLOFENAC SODIUM 75 MG PO TBEC: 75.0000 mg | DELAYED_RELEASE_TABLET | Freq: Two times a day (BID) | ORAL | 1 refills | Status: DC

## 2016-07-16 NOTE — Assessment & Plan Note (Signed)
It appears that she has some arthritic changes with both feet. It is unclear if she has a systemic issue. 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  - provided information about obtaining a mid foot arch strap  - she may also benefit from orthotics.

## 2016-07-16 NOTE — Progress Notes (Signed)
   Subjective:    Patient ID: Tracey Weaver, female    DOB: [birth year], 50 y.o.   MRN: [id number]  Chief Complaint  Patient presents with  . joint pain of unknown origin    PCP: SMITH,KRISTI, MD  HPI  This is a 50 y.o. female who is presenting with pain in joints. Feels like she has heaviness in her ankles. Had three ticks this summer. Has a hysterectomy when she was 50 yo. The pain is felt in her hips, feet and her hands. She feels most of the pain in her thumbs and her first finger. She feels the pain in the dorsal aspect of each midfoot. These symptoms have going on for the past 6 months. The pain has been getting worse. She has taken ibuprofen for relief. She works as a Emergency planning/management officer. She has taken tumeric. The ibuprofen helps. She feels stiff when she has been sitting down after standing all day. The tick bites occurred in [place]. She was not treated with any antibiotics. She denies any fevers. No new medications. She has been to July in [place] but symptoms were present prior to that. She feels her joints get swollen and especially in her hands.   Review of Systems  PMH: splenomegaly,  PShx: hysterectomy,  PSx: no tobacco. Occasional alcohol use  FHx: RA, psoriatic arthritis.     Objective:   Physical Exam BP 124/80 (BP Location: Right Arm, Patient Position: Sitting, Cuff Size: Normal)   Pulse 78   Temp 97.8 F (36.6 C) (Oral)   Resp 17   Ht _0  (1.676 m)   Wt 173 lb (78.5 kg)   SpO2 99%   BMI 27.92 kg/m  Gen: NAD, alert, cooperative with exam, well-appearing HEENT: EOMI, clear conjunctiva,  CV: no edema, capillary refill brisk  Resp:  non-labored Skin: no rashes, normal turgor  Neuro: no gross deficits.  Psych: alert and oriented Hand:  No joint deformity.  No obvious swelling or redness of the joints. Normal ROM b/l  Normal grip strength  Normal pincer grasp Normal thumb opposition  Pain to palpation of the MCP and CMC b/l  Negative grind test  Negative  Finkelstein's  Feet:  No obvious swelling or redness  TMT bossing of the left foot  Hypertrophy of the 1st MTP on the right  Normal ROM  Normal strength  Negative anterior drawer  No abnormal callus formation on plantar aspect of foot.  Neurovascularly intact.         Assessment & Plan:   Pain in joint involving ankle and foot It appears that she has some arthritic changes with both feet. It is unclear if she has a systemic issue. 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  - provided information about obtaining a mid foot arch strap  - she may also benefit from orthotics.   Arthralgia of both hands Doesn't appear to be RA. More likely she has overuse due to her job.  - RF, CRP, ESR, ANA today  - will start voltaren and monitor for improvement  - may try voltaren gel in the future.

## 2016-07-16 NOTE — Patient Instructions (Addendum)
Thank you for coming in,   We will call you with the results from today.   You can try a mid foot strap of each foot.   You can try orthotics. You can obtain these from [employer] or [employer]. Try to get a pair that fit into multiple shoes.    Please feel free to call with any questions or concerns at any time, at [phone]. --Dr. Raeford Razor      IF you received an x-ray today, you will receive an invoice from [facility]. Please contact [facility] at [phone] with questions or concerns regarding your invoice.   IF you received labwork today, you will receive an invoice from [employer]. Please contact [facility] at [phone] with questions or concerns regarding your invoice.   Our billing staff will not be able to assist you with questions regarding bills from these companies.  You will be contacted with the lab results as soon as they are available. The fastest way to get your results is to activate your My Chart account. Instructions are located on the last page of this paperwork. If you have not heard from [place] regarding the results in 2 weeks, please contact this office.

## 2016-07-16 NOTE — Assessment & Plan Note (Signed)
Doesn't appear to be RA. More likely she has overuse due to her job.  - RF, CRP, ESR, ANA today  - will start voltaren and monitor for improvement  - may try voltaren gel in the future.

## 2016-07-17 LAB — RHEUMATOID FACTOR: Rhuematoid fact SerPl-aCnc: <14 IU/mL (ref <14)

## 2016-07-17 LAB — SEDIMENTATION RATE: SED RATE: 1 mm/h (ref 0–20)

## 2016-07-17 LAB — C-REACTIVE PROTEIN: CRP: 1.5 mg/L (ref <8.0)

## 2016-07-17 LAB — ANA: Anti Nuclear Antibody(ANA): NEGATIVE

## 2016-07-30 ENCOUNTER — Ambulatory Visit (INDEPENDENT_AMBULATORY_CARE_PROVIDER_SITE_OTHER): Payer: BLUE CROSS/BLUE SHIELD

## 2016-07-30 ENCOUNTER — Ambulatory Visit (INDEPENDENT_AMBULATORY_CARE_PROVIDER_SITE_OTHER): Payer: BLUE CROSS/BLUE SHIELD | Admitting: Physician Assistant

## 2016-07-30 VITALS — BP 126/88 | HR 78 | Temp 98.2°F | Resp 17 | Ht 66.0 in | Wt 167.0 lb

## 2016-07-30 DIAGNOSIS — S79911A Unspecified injury of right hip, initial encounter: Secondary | ICD-10-CM | POA: Diagnosis not present

## 2016-07-30 DIAGNOSIS — M25551 Pain in right hip: Secondary | ICD-10-CM | POA: Diagnosis not present

## 2016-07-30 NOTE — Patient Instructions (Addendum)
Continue using volataren for 2 weeks daily. Use a heating pad to the affected area. Start performing stretches below to help with pain. If no improvement with this treatment, follow up in 2 weeks.    Trochanteric Bursitis Rehab Ask your health care provider which exercises are safe for you. Do exercises exactly as told by your health care provider and adjust them as directed. It is normal to feel mild stretching, pulling, tightness, or discomfort as you do these exercises, but you should stop right away if you feel sudden pain or your pain gets worse.Do not begin these exercises until told by your health care provider. Stretching exercises These exercises warm up your muscles and joints and improve the movement and flexibility of your hip. These exercises also help to relieve pain and stiffness. Exercise A: Iliotibial band stretch 1. Lie on your side with your left / right leg in the top position. 2. Bend your left / right knee and grab your ankle. 3. Slowly bring your knee back so your thigh is behind your body. 4. Slowly lower your knee toward the floor until you feel a gentle stretch on the outside of your left / right thigh. If you do not feel a stretch and your knee will not fall farther, place the heel of your other foot on top of your outer knee and pull your thigh down farther. 5. Hold this position for __________ seconds. 6. Slowly return to the starting position. Repeat __________ times. Complete this exercise __________ times a day. Strengthening exercises These exercises build strength and endurance in your hip and pelvis. Endurance is the ability to use your muscles for a long time, even after they get tired. Exercise B: Bridge (hip extensors) 1. Lie on your back on a firm surface with your knees bent and your feet flat on the floor. 2. Tighten your buttocks muscles and lift your buttocks off the floor until your trunk is level with your thighs. You should feel the muscles working in  your buttocks and the back of your thighs. If this exercise is too easy, try doing it with your arms crossed over your chest. 3. Hold this position for __________ seconds. 4. Slowly return to the starting position. 5. Let your muscles relax completely between repetitions. Repeat __________ times. Complete this exercise __________ times a day. Exercise C: Squats (knee extensors and  quadriceps) 1. Stand in front of a table, with your feet and knees pointing straight ahead. You may rest your hands on the table for balance but not for support. 2. Slowly bend your knees and lower your hips like you are going to sit in a chair.  Keep your weight over your heels, not over your toes.  Keep your lower legs upright so they are parallel with the table legs.  Do not let your hips go lower than your knees.  Do not bend lower than told by your health care provider.  If your hip pain increases, do not bend as low. 3. Hold this position for __________ seconds. 4. Slowly push with your legs to return to standing. Do not use your hands to pull yourself to standing. Repeat __________ times. Complete this exercise __________ times a day. Exercise D: Hip hike 1. Stand sideways on a bottom step. Stand on your left / right leg with your other foot unsupported next to the step. You can hold onto the railing or wall if needed for balance. 2. Keeping your knees straight and your torso square, lift  your left / right hip up toward the ceiling. 3. Hold this position for __________ seconds. 4. Slowly let your left / right hip lower toward the floor, past the starting position. Your foot should get closer to the floor. Do not lean or bend your knees. Repeat __________ times. Complete this exercise __________ times a day. Exercise E: Single leg stand 1. Stand near a counter or door frame that you can hold onto for balance as needed. It is helpful to stand in front of a mirror for this exercise so you can watch your  hip. 2. Squeeze your left / right buttock muscles then lift up your other foot. Do not let your left / right hip push out to the side. 3. Hold this position for __________ seconds. Repeat __________ times. Complete this exercise __________ times a day. This information is not intended to replace advice given to you by your health care provider. Make sure you discuss any questions you have with your health care provider. Document Released: 09/20/2004 Document Revised: 04/19/2016 Document Reviewed: 07/29/2015 Elsevier Interactive Patient Education  2017 [employer].     IF you received an x-ray today, you will receive an invoice from [facility]. Please contact [facility] Radiology at [phone] with questions or concerns regarding your invoice.   IF you received labwork today, you will receive an invoice from [employer]. Please contact [facility] at [phone] with questions or concerns regarding your invoice.   Our billing staff will not be able to assist you with questions regarding bills from these companies.  You will be contacted with the lab results as soon as they are available. The fastest way to get your results is to activate your My Chart account. Instructions are located on the last page of this paperwork. If you have not heard from [place] regarding the results in 2 weeks, please contact this office.     Marland Kitchendi

## 2016-07-30 NOTE — Progress Notes (Signed)
Tracey Weaver  MRN: [id number] DOB: November 15, 1965  Subjective:  Tracey Weaver is a 50 y.o. female seen in office today for a chief complaint of joint pain x 2 months.   She was initially seen here on 07/16/16 for ankle, foot, and hip pain. She had been exposed to ticks in the summer so CRP, RF, Sed rate, and ANA labs were collected and were negatiave. She was treated for joint pain with voltaren 75mg  BID. She also purchased some good foot inserts and notes that these along with the medication have helped the foot pain but her right hip is really bothering her.   Of note, she did have a fall down the stairs two months ago, which she forgot to mention at her previous visit. She landed on the left side but notes the right hip has not been right since. She denies pain in left hip. States the pain in left hip is a deep ache. Has associated stiffness which is worsened with activity especialy with bending and walking up stairs. Denies numnbess and tingling in leg. 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 Works about 55 hours a week.   Review of Systems  Constitutional: Negative for chills, diaphoresis, fatigue and fever.  Genitourinary: Negative for difficulty urinating, dyspareunia and dysuria.  Musculoskeletal: Positive for gait problem (will limp to initiate movement, then can walk normal) and joint swelling (in thumbs bilaterally). Negative for back pain and neck pain.  Skin: Negative for rash.    Patient Active Problem List   Diagnosis Date Noted  . Pain in joint involving ankle and foot 07/16/2016  . Arthralgia of both hands 07/16/2016  . Left groin pain 02/25/2015  . Chest pain 01/11/2015  . Neck pain on left side 11/24/2012  . Abdominal pain, left upper quadrant 11/24/2012  . Benign paroxysmal positional vertigo 11/24/2012  . Splenomegaly 11/24/2012  . Abdominal pain, epigastric 11/24/2012  . Breast cancer screening 11/24/2012  . Motion sickness 11/24/2012     Current Outpatient Prescriptions on File Prior to Visit  Medication Sig Dispense Refill  . diclofenac (VOLTAREN) 75 MG EC tablet Take 1 tablet (75 mg total) by mouth 2 (two) times daily. 30 tablet 1   No current facility-administered medications on file prior to visit.     Allergies  Allergen Reactions  . Meloxicam Other (See Comments)    "MAKES ME FEEL FUNNY", Dizziness     Objective:  BP 126/88 (BP Location: Right Arm, Patient Position: Sitting, Cuff Size: Normal)   Pulse 78   Temp 98.2 F (36.8 C) (Oral)   Resp 17   Ht 5\' 6"  (1.676 m)   Wt 167 lb (75.8 kg)   SpO2 98%   BMI 26.95 kg/m   Physical Exam  Constitutional: She is oriented to person, place, and time and well-developed, well-nourished, and in no distress.  HENT:  Head: Normocephalic and atraumatic.  Eyes: Conjunctivae are normal.  Neck: Normal range of motion.  Pulmonary/Chest: Effort normal.  Musculoskeletal:       Left hip: Normal.       Right knee: Normal.       Left knee: Normal.       Legs: -Pain at greater trochanter when resistance against hip abduction -Pain at greater trochanter with passive internal rotaiton of hip with knee flexed at 90 degrees.  -Pain at greater trochanter when resistance applied to internal rotaiton of hip with knee flexed at 90 degrees.  Neurological: She is alert and oriented to person, place, and time. She has normal sensation, normal strength and normal reflexes. She has a normal Straight Leg Raise Test. Gait normal. Gait normal.  Skin: Skin is warm and dry.  Psychiatric: Affect normal.  Nursing note and vitals reviewed.   Dg Hip Unilat W Or W/o Pelvis 2-3 Views Right  Result Date: 07/30/2016 CLINICAL DATA:  Right hip pain.  Fall 2 months ago EXAM: DG HIP (WITH OR WITHOUT PELVIS) 2-3V RIGHT COMPARISON:  None. FINDINGS: There is no evidence of hip fracture or dislocation. There is no evidence of arthropathy or other focal bone abnormality. IMPRESSION: Negative.  Electronically Signed   By: Franchot Gallo M.D.   On: 07/30/2016 11:54    Assessment and Plan :   1. Right hip pain -History and physical exam findings are consistent with greater trochanteric bursitis  - DG HIP UNILAT W OR W/O PELVIS 2-3 VIEWS RIGHT; Future - Instructed to continue using volataren for 2 weeks daily. Use a heating pad to the affected area. Start performing stretches provided to help with pain. If no improvement with this treatment, follow up in 2 weeks. May consider injection in 2 weeks  Tenna Delaine [name]  [facility] 07/30/2016 11:19 AM

## 2016-08-11 ENCOUNTER — Other Ambulatory Visit: Payer: Self-pay | Admitting: Family Medicine

## 2016-08-11 DIAGNOSIS — M25579 Pain in unspecified ankle and joints of unspecified foot: Secondary | ICD-10-CM

## 2016-08-26 ENCOUNTER — Other Ambulatory Visit: Payer: Self-pay | Admitting: Family Medicine

## 2016-08-26 DIAGNOSIS — M25579 Pain in unspecified ankle and joints of unspecified foot: Secondary | ICD-10-CM

## 2016-09-04 ENCOUNTER — Other Ambulatory Visit: Payer: Self-pay | Admitting: Family Medicine

## 2016-09-04 DIAGNOSIS — M25579 Pain in unspecified ankle and joints of unspecified foot: Secondary | ICD-10-CM

## 2016-11-09 ENCOUNTER — Telehealth: Payer: Self-pay | Admitting: Family Medicine

## 2016-11-09 NOTE — Telephone Encounter (Signed)
MyChart message sent to patient about rescheduling their appt with Dr Tamala Julian on 02/05/17.

## 2016-11-13 ENCOUNTER — Ambulatory Visit (INDEPENDENT_AMBULATORY_CARE_PROVIDER_SITE_OTHER): Payer: BLUE CROSS/BLUE SHIELD | Admitting: Urgent Care

## 2016-11-13 VITALS — BP 114/70 | HR 85 | Temp 97.9°F | Resp 17 | Ht 66.0 in | Wt 169.0 lb

## 2016-11-13 DIAGNOSIS — M2550 Pain in unspecified joint: Secondary | ICD-10-CM | POA: Diagnosis not present

## 2016-11-13 DIAGNOSIS — M5030 Other cervical disc degeneration, unspecified cervical region: Secondary | ICD-10-CM

## 2016-11-13 MED ORDER — DICLOFENAC SODIUM 75 MG PO TBEC: 75.0000 mg | DELAYED_RELEASE_TABLET | Freq: Two times a day (BID) | ORAL | 5 refills | Status: DC | PRN

## 2016-11-13 MED ORDER — CYCLOBENZAPRINE HCL 5 MG PO TABS: 5.0000 mg | ORAL_TABLET | Freq: Three times a day (TID) | ORAL | 5 refills | Status: DC | PRN

## 2016-11-13 NOTE — Patient Instructions (Addendum)
Tylenol 500mg  every 6 hours as needed for arthritis of the knee and joint pains. You can try chondroitin glucosamine otc to help joints. Try to avoid using diclofenac every day and make sure you try your muscle relaxant (Flexeril). If it causes drowsiness then take Flexeril at night only.   Diclofenac sodium enteric-coated tablets What is this medicine? DICLOFENAC (dye KLOE fen ak) is a non-steroidal anti-inflammatory drug (NSAID). It is used to reduce swelling and to treat pain. It may be used to treat osteoarthritis, rheumatoid arthritis, and ankylosing spondylitis. This medicine may be used for other purposes; ask your health care provider or pharmacist if you have questions. COMMON BRAND NAME(S): Voltaren What should I tell my health care provider before I take this medicine? They need to know if you have any of these conditions: -asthma, especially aspirin sensitive asthma -coronary artery bypass graft (CABG) surgery within the past 2 weeks -drink more than 3 alcohol containing drinks a day -heart disease or circulation problems like heart failure or leg edema (fluid retention) -high blood pressure -kidney disease -liver disease -stomach bleeding or ulcers -an unusual or allergic reaction to diclofenac, aspirin, other NSAIDs, other medicines, foods, dyes, or preservatives -pregnant or trying to get pregnant -breast-feeding How should I use this medicine? Take this medicine by mouth with food and with a full glass of water. Do not crush or chew the medicine. Follow the directions on the prescription label. Take your medicine at regular intervals. Do not take your medicine more often than directed. Long-term, continuous use may increase the risk of heart attack or stroke. A special MedGuide will be given to you by the pharmacist with each prescription and refill. Be sure to read this information carefully each time. Talk to your pediatrician regarding the use of this medicine in children.  Special care may be needed. Elderly patients over 51 years old may have a stronger reaction and need a smaller dose. Overdosage: If you think you have taken too much of this medicine contact a poison control center or emergency room at once. NOTE: This medicine is only for you. Do not share this medicine with others. What if I miss a dose? If you miss a dose, take it as soon as you can. If it is almost time for your next dose, take only that dose. Do not take double or extra doses. What may interact with this medicine? Do not take this medicine with any of the following medications: -cidofovir -ketorolac -methotrexate This medicine may also interact with the following medications: -alcohol -aspirin and aspirin like medicines -cyclosporine -diuretics -lithium -medicines for blood pressure -medicines for osteoporosis -medicines that affect platelets -medicines that treat or prevent blood clots like warfarin -NSAIDs, medicines for pain and inflammation, like ibuprofen or naproxen -pemetrexed -steroid medicines like prednisone or cortisone This list may not describe all possible interactions. Give your health care provider a list of all the medicines, herbs, non-prescription drugs, or dietary supplements you use. Also tell them if you smoke, drink alcohol, or use illegal drugs. Some items may interact with your medicine. What should I watch for while using this medicine? Tell your doctor or health care professional if your pain does not get better. Talk to your doctor before taking another medicine for pain. Do not treat yourself. This medicine does not prevent heart attack or stroke. In fact, this medicine may increase the chance of a heart attack or stroke. The chance may increase with longer use of this medicine and in people  who have heart disease. If you take aspirin to prevent heart attack or stroke, talk with your doctor or health care professional. Do not take medicines such as  ibuprofen and naproxen with this medicine. Side effects such as stomach upset, nausea, or ulcers may be more likely to occur. Many medicines available without a prescription should not be taken with this medicine. This medicine can cause ulcers and bleeding in the stomach and intestines at any time during treatment. Do not smoke cigarettes or drink alcohol. These increase irritation to your stomach and can make it more susceptible to damage from this medicine. Ulcers and bleeding can happen without warning symptoms and can cause death. You may get drowsy or dizzy. Do not drive, use machinery, or do anything that needs mental alertness until you know how this medicine affects you. Do not stand or sit up quickly, especially if you are an older patient. This reduces the risk of dizzy or fainting spells. This medicine can cause you to bleed more easily. Try to avoid damage to your teeth and gums when you brush or floss your teeth. What side effects may I notice from receiving this medicine? Side effects that you should report to your doctor or health care professional as soon as possible: -allergic reactions like skin rash, itching or hives, swelling of the face, lips, or tongue -black or bloody stools, blood in the urine or vomit -blurred vision -chest pain -difficulty breathing or wheezing -nausea or vomiting -slurred speech or weakness on one side of the body -unexplained weight gain or swelling -unusually weak or tired -yellowing of eyes or skin Side effects that usually do not require medical attention (report to your doctor or health care professional if they continue or are bothersome): -constipation -diarrhea -dizziness -headache -heartburn This list may not describe all possible side effects. Call your doctor for medical advice about side effects. You may report side effects to FDA at 1-800-FDA-1088. Where should I keep my medicine? Keep out of the reach of children. Store at room  temperature below 30 degrees C (86 degrees F). Protect from moisture. Keep container tightly closed. Throw away any unused medicine after the expiration date. NOTE: This sheet is a summary. It may not cover all possible information. If you have questions about this medicine, talk to your doctor, pharmacist, or health care provider.  2018 Elsevier/Gold Standard (2008-12-31 14:42:31)    IF you received an x-ray today, you will receive an invoice from [facility]. Please contact [facility] Radiology at [phone] with questions or concerns regarding your invoice.   IF you received labwork today, you will receive an invoice from [facility]. Please contact [facility] at [phone] with questions or concerns regarding your invoice.   Our billing staff will not be able to assist you with questions regarding bills from these companies.  You will be contacted with the lab results as soon as they are available. The fastest way to get your results is to activate your My Chart account. Instructions are located on the last page of this paperwork. If you have not heard from [place] regarding the results in 2 weeks, please contact this office.

## 2016-11-13 NOTE — Progress Notes (Signed)
  MRN: [id number] DOB: 13-Feb-1966  Subjective:   Tracey Weaver is a 51 y.o. female presenting for chief complaint of Medication Refill (voltaren )  Reports longstanding history of joint pain of her hands, elbows, knees, neck, hips. Has had imaging of her neck, shows degenerative disc disease. She also had x-rays of her hip in 07/2016, these were negative. Her mother has history of RA, lab work was negative for [employer]. Denies trauma, numbness, tingling, weakness. 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   Tracey Weaver has a current medication list which includes the following prescription(s): diclofenac. Also is allergic to meloxicam.  Tracey Weaver  has a past medical history of Adjustment disorder with depressed mood; Anxiety; Cervical cancer ([place]); Chest pain; Constipation; Degenerative disc disease, cervical; Dysplastic nevus (08/27/2013); Dysthymic disorder; History of ectopic pregnancy; IBS (irritable bowel syndrome); Interstitial cystitis; Kidney stones; Liver function study, abnormal; Pneumonia; Pyelonephritis; Recurrent UTI; Splenomegaly; and Ulcer ([place]) (09/28/2011). Also  has a past surgical history that includes Esophagogastroduodenoscopy (09/28/2011); Cesarean section; Colonoscopy (08/27/2010); Tonsillectomy (05/27/2014); Cystostomy w/ bladder biopsy (05/27/2014); Abdominal hysterectomy (08/27/1998); and Abdominal exploration surgery (1997).  Objective:   Vitals: BP 114/70   Pulse 85   Temp 97.9 F (36.6 C) (Oral)   Resp 17   Ht 5\' 6"  (1.676 m)   Wt 169 lb (76.7 kg)   SpO2 97%   BMI 27.28 kg/m   Physical Exam  Constitutional: She is oriented to person, place, and time. She appears well-developed and well-nourished.  Cardiovascular: Normal rate.   Pulmonary/Chest: Effort normal.  Neurological: She is alert and oriented to person, place, and time.  Psychiatric: She  has a normal mood and affect.   Assessment and Plan :   1. Degenerative disc disease, cervical 2. Multiple joint pain - Counseled patient on daily use of diclofenac. Will have patient use Tylenol and muscle relaxants. Use diclofenac for breakthrough pain. Also recommended use of glucosamine-chondroitin. Labs pending.  Jaynee Eagles, [name] [facility] [phone] 11/13/2016  12:14 PM

## 2016-11-14 LAB — CMP14+EGFR
A/G RATIO: 2.2 (ref 1.2–2.2)
ALBUMIN: 4.3 g/dL (ref 3.5–5.5)
ALK PHOS: 55 IU/L (ref 39–117)
ALT: 28 IU/L (ref 0–32)
AST: 17 IU/L (ref 0–40)
BILIRUBIN TOTAL: 0.3 mg/dL (ref 0.0–1.2)
BUN / CREAT RATIO: 26 — AB (ref 9–23)
BUN: 15 mg/dL (ref 6–24)
CHLORIDE: 104 mmol/L (ref 96–106)
CO2: 24 mmol/L (ref 18–29)
Calcium: 9.3 mg/dL (ref 8.7–10.2)
Creatinine, Ser: 0.57 mg/dL (ref 0.57–1.00)
GFR calc Af Amer: 125 mL/min/{1.73_m2} (ref >59)
GFR calc non Af Amer: 108 mL/min/{1.73_m2} (ref >59)
GLOBULIN, TOTAL: 2 g/dL (ref 1.5–4.5)
GLUCOSE: 109 mg/dL — AB (ref 65–99)
Potassium: 4.3 mmol/L (ref 3.5–5.2)
SODIUM: 143 mmol/L (ref 134–144)
Total Protein: 6.3 g/dL (ref 6.0–8.5)

## 2016-12-07 ENCOUNTER — Telehealth: Payer: Self-pay | Admitting: Family Medicine

## 2016-12-07 NOTE — Telephone Encounter (Signed)
Pt had reviewed her labs on my chart and has some questions   Best number [phone]

## 2016-12-10 NOTE — Telephone Encounter (Signed)
Wanted to make sure elevated bun/creatinine wasn't worrisome and needed to f/u with nephrology. I advised just slightly elevated but individual numbers ik. No f/u needed

## 2016-12-11 NOTE — Telephone Encounter (Signed)
faxed

## 2016-12-11 NOTE — Telephone Encounter (Signed)
Patient wants her latest results faxed to Dr. Sherryll Burger. Fax number is [phone]. Can you please fax them? Thank you!

## 2016-12-19 ENCOUNTER — Other Ambulatory Visit: Payer: Self-pay | Admitting: Family Medicine

## 2016-12-19 DIAGNOSIS — Z1231 Encounter for screening mammogram for malignant neoplasm of breast: Secondary | ICD-10-CM

## 2017-01-14 DIAGNOSIS — M76829 Posterior tibial tendinitis, unspecified leg: Secondary | ICD-10-CM | POA: Diagnosis not present

## 2017-01-14 DIAGNOSIS — M2140 Flat foot [pes planus] (acquired), unspecified foot: Secondary | ICD-10-CM | POA: Diagnosis not present

## 2017-01-15 ENCOUNTER — Encounter: Payer: Self-pay | Admitting: Family Medicine

## 2017-01-15 ENCOUNTER — Ambulatory Visit (INDEPENDENT_AMBULATORY_CARE_PROVIDER_SITE_OTHER): Payer: BLUE CROSS/BLUE SHIELD | Admitting: Family Medicine

## 2017-01-15 VITALS — BP 120/80 | HR 85 | Temp 98.0°F | Ht 66.0 in | Wt 171.5 lb

## 2017-01-15 DIAGNOSIS — R079 Chest pain, unspecified: Secondary | ICD-10-CM | POA: Diagnosis not present

## 2017-01-15 DIAGNOSIS — N3010 Interstitial cystitis (chronic) without hematuria: Secondary | ICD-10-CM | POA: Diagnosis not present

## 2017-01-15 DIAGNOSIS — R002 Palpitations: Secondary | ICD-10-CM

## 2017-01-15 LAB — UA/M W/RFLX CULTURE, ROUTINE
BILIRUBIN UA: NEGATIVE
Glucose, UA: NEGATIVE
KETONES UA: NEGATIVE
LEUKOCYTES UA: NEGATIVE
Nitrite, UA: NEGATIVE
PROTEIN UA: NEGATIVE
RBC UA: NEGATIVE
Urobilinogen, Ur: 0.2 mg/dL (ref 0.2–1.0)
pH, UA: 5.5 (ref 5.0–7.5)

## 2017-01-15 LAB — MICROSCOPIC EXAMINATION: RBC, UA: NONE SEEN /hpf (ref 0–?)

## 2017-01-15 NOTE — Progress Notes (Signed)
BP 120/80 (BP Location: Left Arm, Patient Position: Sitting, Cuff Size: Normal)   Pulse 85   Temp 98 F (36.7 C)   Ht 5' 6" (1.676 m)   Wt 171 lb 8 oz (77.8 kg)   SpO2 99%   BMI 27.68 kg/m    Subjective:    Patient ID: Tracey Weaver, female    DOB: 1966/04/29, 51 y.o.   MRN: [id number]  HPI: Tracey Weaver is a 51 y.o. female  Chief Complaint  Patient presents with  . Establish Care    No complaints, patient stated she just needs a provider closer to home.   CHEST PAIN- had a stress test a couple of years ago. She notes that when she is wearing her fitbit, her HR will be up in the 110s when she has the pain Time since onset: A few years Duration: hours Onset: sudden Quality: dull Severity: mild Location: left para substernal Radiation: none Episode duration: Couple of hours Frequency: intermittent, really seems to come on randomly Related to exertion: no Trauma: no Anxiety/recent stressors: yes Aggravating factors:  Alleviating factors:  Status: worse Treatments attempted: nothing  Current pain status: pain free Shortness of breath: no Cough: no Nausea: no Diaphoresis: no Heartburn: no Palpitations: no  Active Ambulatory Problems    Diagnosis Date Noted  . Benign paroxysmal positional vertigo 11/24/2012  . Splenomegaly 11/24/2012  . Motion sickness 11/24/2012  . Chest pain 01/11/2015  . IC (interstitial cystitis) 01/15/2017   Resolved Ambulatory Problems    Diagnosis Date Noted  . Neck pain on left side 11/24/2012  . Abdominal pain, left upper quadrant 11/24/2012  . Abdominal pain, epigastric 11/24/2012  . Breast cancer screening 11/24/2012  . Left groin pain 02/25/2015  . Pain in joint involving ankle and foot 07/16/2016  . Arthralgia of both hands 07/16/2016   Past Medical History:  Diagnosis Date  . Adjustment disorder with depressed mood   . Anxiety   . Cervical cancer ([place])   . Chest pain   . Constipation   . Degenerative disc disease,  cervical   . Dysplastic nevus 08/27/2013  . Dysthymic disorder   . History of ectopic pregnancy   . IBS (irritable bowel syndrome)   . Interstitial cystitis   . Kidney stones   . Liver function study, abnormal   . Pneumonia   . Pyelonephritis   . Recurrent UTI   . Splenomegaly   . Ulcer 09/28/2011   Past Surgical History:  Procedure Laterality Date  . ABDOMINAL EXPLORATION SURGERY  1997   ruptured tube from eptopic pregnancy  . ABDOMINAL HYSTERECTOMY  08/27/1998   cervical dysplasia;one ovary remaining  . CESAREAN SECTION     x 2.  . COLONOSCOPY  08/27/2010   [name].    . CYSTOSTOMY W/ BLADDER BIOPSY  05/27/2014   [name]. Benign.  . ESOPHAGOGASTRODUODENOSCOPY  09/28/2011   +gastric ulcer.  [name].  . TONSILLECTOMY  05/27/2014   [facility].   Outpatient Encounter Prescriptions as of 01/15/2017  Medication Sig  . BLACK COHOSH PO Take by mouth.  . cyclobenzaprine (FLEXERIL) 5 MG tablet Take 1 tablet (5 mg total) by mouth 3 (three) times daily as needed.  . diclofenac (VOLTAREN) 75 MG EC tablet Take 1 tablet (75 mg total) by mouth 2 (two) times daily as needed.   No facility-administered encounter medications on file as of 01/15/2017.    Allergies  Allergen Reactions  . Meloxicam Other (See Comments)    "MAKES ME FEEL FUNNY",  Dizziness   Social History   Social History  . Marital status: Married    Spouse name: N/A  . Number of children: 2  . Years of education: 14   Occupational History  . hairstylist Self Employed   Social History Main Topics  . Smoking status: Never Smoker  . Smokeless tobacco: Never Used  . Alcohol use 0.0 oz/week     Comment: 1-2 drinks per month  . Drug use: No  . Sexual activity: Yes    Partners: Male   Other Topics Concern  . 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      Tobacco:  None      Alcohol: 1-2 servings per month.      Drugs:  none      Exercise:  none      Caffeine use: Coffee, 1 serving per day   Always uses seat belts, smoke alarm and carbon monoxide detector in the home, Guns locked up.   Organ Donor: YES   Patient DOES not have living will, DOES not have HCPOA.            Family History  Problem Relation Age of Onset  . Hypertension Mother   . COPD Mother   . Hyperlipidemia Mother   . Alcohol abuse Mother   . Colon polyps Mother   . Heart attack Mother   . Heart disease Mother 9       AMI x 2; stents.  PAD s/p stenting.  . Cancer Father        prostate cancer with mets  . Diabetes Father   . Heart disease Father 48       AMI age 69  . Hyperlipidemia Father   . Hypertension Father   . Mental illness Brother        paranoid schizophrenia  . Hypothyroidism Brother   . Hyperlipidemia Brother   . Epilepsy Son   . Diabetes Son   . Stroke Maternal Grandmother   . Heart disease Maternal Grandmother   . Hypertension Maternal Grandmother   . Stroke Maternal Grandfather   . Heart disease Maternal Grandfather   . Hypertension Maternal Grandfather   . Cancer Paternal Grandmother   . Heart disease Paternal Grandmother   . Diabetes Paternal Grandmother   . Stroke Paternal Grandfather   . Heart disease Paternal Grandfather   . Cancer Brother 40       lung, germ cell; lobectomy at 49  . Drug abuse Brother   . Hypertension Brother     Review of Systems  Constitutional: Negative.   Respiratory: Negative.   Cardiovascular: Positive for chest pain. Negative for palpitations and leg swelling.  Neurological: Negative.   Psychiatric/Behavioral: Negative.     Per HPI unless specifically indicated above     Objective:    BP 120/80 (BP Location: Left Arm, Patient Position: Sitting, Cuff Size: Normal)   Pulse 85   Temp 98 F (36.7 C)   Ht 5' 6" (1.676 m)   Wt 171 lb 8 oz (77.8 kg)   SpO2 99%   BMI 27.68 kg/m     Wt Readings from Last 3 Encounters:  01/15/17 171 lb 8 oz (77.8 kg)  11/13/16 169 lb (76.7 kg)  07/30/16 167 lb (75.8 kg)    Physical Exam  Constitutional: She is oriented to person, place, and time. She appears well-developed and well-nourished. No distress.  HENT:  Head: Normocephalic and atraumatic.  Right Ear: Hearing normal.  Left Ear: Hearing normal.  Nose: Nose normal.  Eyes: Conjunctivae and lids are normal. Right eye exhibits no discharge. Left eye exhibits no discharge. No scleral icterus.  Cardiovascular: Normal rate, regular rhythm, normal heart sounds and intact distal pulses.  Exam reveals no gallop and no friction rub.   No murmur heard. Pulmonary/Chest: Effort normal and breath sounds normal. No respiratory distress. She has no wheezes. She has no rales. She exhibits no tenderness.  Musculoskeletal: Normal range of motion.  Neurological: She is alert and oriented to person, place, and time.  Skin: Skin is warm, dry and intact. No rash noted. No erythema. No pallor.  Psychiatric: She has a normal mood and affect. Her speech is normal and behavior is normal. Judgment and thought content normal. Cognition and memory are normal.  Nursing note and vitals reviewed.   Results for orders placed or performed in visit on 11/13/16  CMP14+EGFR  Result Value Ref Range   Glucose 109 (H) 65 - 99 mg/dL   BUN 15 6 - 24 mg/dL   Creatinine, Ser 0.57 0.57 - 1.00 mg/dL   GFR calc non Af Amer 108 >59 mL/min/1.73   GFR calc Af Amer 125 >59 mL/min/1.73   BUN/Creatinine Ratio 26 (H) 9 - 23   Sodium 143 134 - 144 mmol/L   Potassium 4.3 3.5 - 5.2 mmol/L   Chloride 104 96 - 106 mmol/L   CO2 24 18 - 29 mmol/L   Calcium 9.3 8.7 - 10.2 mg/dL   Total Protein 6.3 6.0 - 8.5 g/dL   Albumin 4.3 3.5 - 5.5 g/dL   Globulin, Total 2.0 1.5 - 4.5 g/dL   Albumin/Globulin Ratio 2.2 1.2 - 2.2   Bilirubin Total 0.3 0.0 - 1.2 mg/dL   Alkaline Phosphatase 55 39 - 117 IU/L   AST 17 0 - 40 IU/L   ALT 28  0 - 32 IU/L      Assessment & Plan:   Problem List Items Addressed This Visit      Genitourinary   IC (interstitial cystitis)    Stable right now. Will check urine. Call with any concerns.       Relevant Orders   UA/M w/rflx Culture, Routine     Other   Chest pain - Primary    Seeing Dr. Rockey Situ in early June. Will check labs today. Will hold on EKG as not in pain now. Has had normal stress test previously. Warning signs discussed.        Other Visit Diagnoses    Palpitations       Seeing Dr. Rockey Situ in early June. Will check labs today. Will hold on EKG as not in pain now. Has had normal stress test previously. Warning signs discussed.    Relevant Orders   CBC with Differential/Platelet   Comprehensive metabolic panel   Lipid Panel w/o Chol/HDL Ratio   TSH       Follow up plan: Return PRN, for Physical.

## 2017-01-15 NOTE — Assessment & Plan Note (Signed)
Seeing Dr. Rockey Situ in early June. Will check labs today. Will hold on EKG as not in pain now. Has had normal stress test previously. Warning signs discussed.

## 2017-01-15 NOTE — Assessment & Plan Note (Signed)
Stable right now. Will check urine. Call with any concerns.

## 2017-01-16 LAB — COMPREHENSIVE METABOLIC PANEL
A/G RATIO: 2 (ref 1.2–2.2)
ALBUMIN: 4.3 g/dL (ref 3.5–5.5)
ALK PHOS: 88 IU/L (ref 39–117)
ALT: 116 IU/L — ABNORMAL HIGH (ref 0–32)
AST: 66 IU/L — ABNORMAL HIGH (ref 0–40)
BUN / CREAT RATIO: 31 — AB (ref 9–23)
BUN: 18 mg/dL (ref 6–24)
Bilirubin Total: 0.3 mg/dL (ref 0.0–1.2)
CO2: 25 mmol/L (ref 18–29)
CREATININE: 0.58 mg/dL (ref 0.57–1.00)
Calcium: 9.7 mg/dL (ref 8.7–10.2)
Chloride: 101 mmol/L (ref 96–106)
GFR calc Af Amer: 124 mL/min/{1.73_m2} (ref >59)
GFR calc non Af Amer: 108 mL/min/{1.73_m2} (ref >59)
GLOBULIN, TOTAL: 2.2 g/dL (ref 1.5–4.5)
Glucose: 88 mg/dL (ref 65–99)
POTASSIUM: 4.1 mmol/L (ref 3.5–5.2)
SODIUM: 143 mmol/L (ref 134–144)
Total Protein: 6.5 g/dL (ref 6.0–8.5)

## 2017-01-16 LAB — CBC WITH DIFFERENTIAL/PLATELET
BASOS: 1 %
Basophils Absolute: 0 10*3/uL (ref 0.0–0.2)
EOS (ABSOLUTE): 0.1 10*3/uL (ref 0.0–0.4)
EOS: 3 %
HEMATOCRIT: 43.9 % (ref 34.0–46.6)
HEMOGLOBIN: 14.5 g/dL (ref 11.1–15.9)
Immature Grans (Abs): 0 10*3/uL (ref 0.0–0.1)
Immature Granulocytes: 0 %
LYMPHS ABS: 2 10*3/uL (ref 0.7–3.1)
Lymphs: 36 %
MCH: 30.9 pg (ref 26.6–33.0)
MCHC: 33 g/dL (ref 31.5–35.7)
MCV: 93 fL (ref 79–97)
MONOCYTES: 5 %
MONOS ABS: 0.3 10*3/uL (ref 0.1–0.9)
NEUTROS ABS: 3 10*3/uL (ref 1.4–7.0)
Neutrophils: 55 %
Platelets: 241 10*3/uL (ref 150–379)
RBC: 4.7 x10E6/uL (ref 3.77–5.28)
RDW: 12.6 % (ref 12.3–15.4)
WBC: 5.4 10*3/uL (ref 3.4–10.8)

## 2017-01-16 LAB — LIPID PANEL W/O CHOL/HDL RATIO
CHOLESTEROL TOTAL: 264 mg/dL — AB (ref 100–199)
HDL: 52 mg/dL (ref >39)
Triglycerides: 551 mg/dL (ref 0–149)

## 2017-01-16 LAB — TSH: TSH: 2.21 u[IU]/mL (ref 0.450–4.500)

## 2017-01-17 ENCOUNTER — Telehealth: Payer: Self-pay | Admitting: Family Medicine

## 2017-01-17 DIAGNOSIS — E782 Mixed hyperlipidemia: Secondary | ICD-10-CM

## 2017-01-17 DIAGNOSIS — R748 Abnormal levels of other serum enzymes: Secondary | ICD-10-CM

## 2017-01-17 NOTE — Telephone Encounter (Signed)
Called patient with her blood work and [facility] for her to call back.   Her liver functions are up quite a bite and her triglycerides were really high. This sometimes happens if people have been drinking a bit more recently. If she has, I'd recommend she cut down and we can recheck it fasting in about a month to make sure it comes back to where it's supposed to be. Everything else looks great!   OK to give her this message if she calls, and I'm happy to speak to her if she needs to talk to me.

## 2017-01-18 NOTE — Telephone Encounter (Signed)
Nikki called back. Gave her her lab results. Has not been drinking more or taking tylenol. Will recheck labs in 1 month. Orders in. Call with any concerns.

## 2017-01-28 ENCOUNTER — Other Ambulatory Visit: Payer: Self-pay | Admitting: Family Medicine

## 2017-01-28 ENCOUNTER — Ambulatory Visit
Admission: RE | Admit: 2017-01-28 | Discharge: 2017-01-28 | Disposition: A | Discharge status: Home / Self Care | Payer: BLUE CROSS/BLUE SHIELD | Source: Ambulatory Visit | Attending: Family Medicine | Admitting: Family Medicine

## 2017-01-28 DIAGNOSIS — Z1231 Encounter for screening mammogram for malignant neoplasm of breast: Secondary | ICD-10-CM

## 2017-01-30 NOTE — Progress Notes (Signed)
Cardiology Office Note  Date:  01/31/2017   ID:  Tracey Weaver, DOB July 22, 1966, MRN [id number]  PCP:  Valerie Roys, DO   Chief Complaint  Patient presents with  . other    Self ref for chest pain; dull ache, rapid heart beats with feeling fatigue. Meds reviewed by the pt. verbally.     HPI:  Tracey Weaver is a 51 year old woman With history of depression, anxiety Chest pain Who presents by referral from Dr. 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 She does not appreciate the tachycardia, but watch tells her that she has frequent episodes, seems to be happening daily. She is concerned as parents had atrial fibrillation  Lab work done recently discussed with her including elevated LFTs, total cholesterol 260 Typically baseline cholesterol 200 or 220. Always has normal LFTs Repeat scheduled for one month from now  Had previous stress test Stress echocardiogram June 2016 in [place], performed for chest pain Stress test with no ischemia, normal LV function  No prior smoking history Both parents with significant coronary disease  EKG personally reviewed by myself on todays visit Shows normal sinus rhythm with rate 100 bpm no significant ST or T-wave changes  PMH:   has a past medical history of Adjustment disorder with depressed mood; Anxiety; Cervical cancer ([place]); Chest pain; Constipation; Degenerative  disc disease, cervical; Dysplastic nevus (08/27/2013); Dysthymic disorder; History of ectopic pregnancy; IBS (irritable bowel syndrome); Interstitial cystitis; Kidney stones; Liver function study, abnormal; Pneumonia; Pyelonephritis; Recurrent UTI; Splenomegaly; and Ulcer (09/28/2011).  PSH:    Past Surgical History:  Procedure Laterality Date  . ABDOMINAL EXPLORATION SURGERY  1997   ruptured tube from eptopic pregnancy  . ABDOMINAL HYSTERECTOMY  08/27/1998   cervical dysplasia;one ovary remaining  . CESAREAN SECTION     x 2.  . COLONOSCOPY  08/27/2010   [name].    . CYSTOSTOMY W/ BLADDER BIOPSY  05/27/2014   [name]. Benign.  . ESOPHAGOGASTRODUODENOSCOPY  09/28/2011   +gastric ulcer.  [name].  . TONSILLECTOMY  05/27/2014   [facility].    Current Outpatient Prescriptions  Medication Sig Dispense Refill  . BLACK COHOSH PO Take by mouth.    . cyclobenzaprine (FLEXERIL) 5 MG tablet Take 1 tablet (5 mg total) by mouth 3 (three) times daily as needed. 90 tablet 5  . diclofenac (VOLTAREN) 75 MG EC tablet Take 1 tablet (75 mg total) by mouth 2 (two) times daily as needed. 60 tablet 5   No current facility-administered medications for this visit.      Allergies:   Meloxicam   Social History:  The patient  reports that she has never smoked. She has never used smokeless tobacco. She reports that she drinks alcohol. She reports that she does not use drugs.   Family History:   family history includes Alcohol abuse in her mother; COPD in her mother; Cancer in her father and paternal grandmother; Cancer (age of onset: 28) in  her brother; Colon polyps in her mother; Diabetes in her father, paternal grandmother, and son; Drug abuse in her brother; Epilepsy in her son; Heart attack in her mother; Heart disease in her maternal grandfather, maternal grandmother, paternal grandfather, and paternal grandmother; Heart disease (age of onset: 45) in her mother; Heart disease (age of onset: 60) in her father;  Hyperlipidemia in her brother, father, and mother; Hypertension in her brother, father, maternal grandfather, maternal grandmother, and mother; Hypothyroidism in her brother; Mental illness in her brother; Stroke in her maternal grandfather, maternal grandmother, and paternal grandfather.    Review of Systems: Review of Systems  Constitutional: Negative.   Respiratory: Negative.   Cardiovascular: Positive for chest pain.       Left side chest pressure Tachycardia on wrist watch  Gastrointestinal: Negative.   Musculoskeletal: Negative.   Neurological: Negative.   Psychiatric/Behavioral: Negative.   All other systems reviewed and are negative.    PHYSICAL EXAM: VS:  BP 136/82 (BP Location: Right Arm, Patient Position: Sitting, Cuff Size: Normal)   Pulse 100   Ht 5\' 6"  (1.676 m)   Wt 175 lb 8 oz (79.6 kg)   BMI 28.33 kg/m  , BMI Body mass index is 28.33 kg/m. GEN: Well nourished, well developed, in no acute distress  HEENT: normal  Neck: no JVD, carotid bruits, or masses Cardiac: RRR; no murmurs, rubs, or gallops,no edema  Respiratory:  clear to auscultation bilaterally, normal work of breathing GI: soft, nontender, nondistended, + BS MS: no deformity or atrophy  Skin: warm and dry, no rash Neuro:  Strength and sensation are intact Psych: euthymic mood, full affect    Recent Labs: 01/15/2017: ALT 116; BUN 18; Creatinine, Ser 0.58; Hemoglobin 14.5; Platelets 241; Potassium 4.1; Sodium 143; TSH 2.210    Lipid Panel Lab Results  Component Value Date   CHOL 264 (H) 01/15/2017   HDL 52 01/15/2017   LDLCALC Comment 01/15/2017   TRIG 551 ([place]) 01/15/2017      Wt Readings from Last 3 Encounters:  01/31/17 175 lb 8 oz (79.6 kg)  01/15/17 171 lb 8 oz (77.8 kg)  11/13/16 169 lb (76.7 kg)       ASSESSMENT AND PLAN:  Chest pain, unspecified type -  Symptoms are atypical in nature, improvement in symptoms with palpation of the left chest Not associated with exertion.  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  discussed various strategies to risk stratify Discuss stress testing including treadmill, stress echo, nuclear stress testing and other screening techniques such as CT coronary calcium scoring. Given her strong family history, high cholesterol, we have recommended CT coronary calcium scoring. We did review CT scan from 2011 with her in detail, images pulled up in the office. This showed no significant coronary calcifications, no plaque in the aorta If calcium score is low/zero, there may not be a strong indication to treat her cholesterol aggressively. Would try life style modification first  Mixed hyperlipidemia Repeat lipid panel scheduled next month Typically baseline cholesterol 200-220  Paroxysmal tachycardia (HCC) On her wrist watch she is recording elevated heart rate on a daily basis  She is concerned about atrial fibrillation given family history . Unable to exclude short runs of sinus tachycardia or atrial tachycardia .  Holter monitor has been ordered, 48 hour   Family history of premature coronary artery disease As above, CT coronary calcium score ordered given strong family history   Disposition:   F/U  as needed   Total  encounter time more than 60 minutes  Greater than 50% was spent in counseling and coordination of care with the patient    Orders Placed This Encounter  Procedures  . CT CARDIAC SCORING  . Holter monitor - 48 hour  . EKG 12-Lead     Signed, Esmond Plants, M.D., Ph.D. 01/31/2017  [place], [place]

## 2017-01-31 ENCOUNTER — Ambulatory Visit (INDEPENDENT_AMBULATORY_CARE_PROVIDER_SITE_OTHER): Payer: BLUE CROSS/BLUE SHIELD | Admitting: Cardiovascular Disease

## 2017-01-31 ENCOUNTER — Encounter: Payer: Self-pay | Admitting: Cardiovascular Disease

## 2017-01-31 VITALS — BP 136/82 | HR 100 | Ht 66.0 in | Wt 175.5 lb

## 2017-01-31 DIAGNOSIS — R079 Chest pain, unspecified: Secondary | ICD-10-CM | POA: Diagnosis not present

## 2017-01-31 DIAGNOSIS — E782 Mixed hyperlipidemia: Secondary | ICD-10-CM | POA: Diagnosis not present

## 2017-01-31 DIAGNOSIS — Z8249 Family history of ischemic heart disease and other diseases of the circulatory system: Secondary | ICD-10-CM | POA: Diagnosis not present

## 2017-01-31 DIAGNOSIS — I479 Paroxysmal tachycardia, unspecified: Secondary | ICD-10-CM

## 2017-01-31 NOTE — Patient Instructions (Addendum)
Medication Instructions:   No medication changes made  Labwork:  No new labs needed  Testing/Procedures:  We will order a holter monitor for tachycardia  Ct coronary calcium score for chest pain and family hx of CAd  Monday at 10:00AM  Cost $150.00 Please arrive 15 minutes early to check in.   I recommend watching educational videos on topics of interest to you at:       [email or web address]  Enter code: [email or web address]    Follow-Up: It was a pleasure seeing you in the office today. Please call us if you have new issues that need to be addressed before your next appt.  [phone]  Your physician wants you to follow-up in: as needed  If you need a refill on your cardiac medications before your next appointment, please call your pharmacy.

## 2017-02-04 ENCOUNTER — Ambulatory Visit (INDEPENDENT_AMBULATORY_CARE_PROVIDER_SITE_OTHER): Payer: BLUE CROSS/BLUE SHIELD

## 2017-02-04 ENCOUNTER — Ambulatory Visit (INDEPENDENT_AMBULATORY_CARE_PROVIDER_SITE_OTHER)
Admission: RE | Admit: 2017-02-04 | Discharge: 2017-02-04 | Disposition: A | Discharge status: Home / Self Care | Payer: Self-pay | Source: Ambulatory Visit | Attending: Cardiovascular Disease | Admitting: Cardiovascular Disease

## 2017-02-04 ENCOUNTER — Other Ambulatory Visit: Payer: Self-pay | Admitting: Cardiovascular Disease

## 2017-02-04 DIAGNOSIS — R918 Other nonspecific abnormal finding of lung field: Secondary | ICD-10-CM | POA: Diagnosis not present

## 2017-02-04 DIAGNOSIS — R000 Tachycardia, unspecified: Secondary | ICD-10-CM

## 2017-02-04 DIAGNOSIS — R079 Chest pain, unspecified: Secondary | ICD-10-CM

## 2017-02-05 ENCOUNTER — Encounter: Payer: BLUE CROSS/BLUE SHIELD | Admitting: Family Medicine

## 2017-02-06 ENCOUNTER — Telehealth: Payer: Self-pay | Admitting: *Deleted

## 2017-02-06 DIAGNOSIS — E785 Hyperlipidemia, unspecified: Secondary | ICD-10-CM

## 2017-02-06 MED ORDER — ROSUVASTATIN CALCIUM 10 MG PO TABS
10.0000 mg | ORAL_TABLET | Freq: Every day | ORAL | 3 refills | Status: DC
Start: 2017-02-06 — End: 2017-07-29

## 2017-02-06 NOTE — Telephone Encounter (Signed)
-----   Message from Minna Merritts, MD sent at 02/05/2017 10:07 PM EDT ----- CT coronary calcium score There is mild CAD/calcified plaque noted Score is 23. This is low, but when compared to others her age, she is in the 93rd percentile. This is very HIGH. Would recommend a statin such as crestor 10 mg daily with recheck of numbers in 1 to 2 months

## 2017-02-06 NOTE — Telephone Encounter (Signed)
Reviewed results and recommendations with patient and she verbalized understanding with no further questions at this time. She states that she might be getting her labs done at PCP but will put them in the system so that we can call and schedule if she has not done them before then. Also sent in prescription to her pharmacy of choice. She was appreciative for the call and had no further questions at this time.

## 2017-02-12 DIAGNOSIS — M2140 Flat foot [pes planus] (acquired), unspecified foot: Secondary | ICD-10-CM | POA: Diagnosis not present

## 2017-02-14 ENCOUNTER — Other Ambulatory Visit: Payer: BLUE CROSS/BLUE SHIELD

## 2017-02-14 DIAGNOSIS — E782 Mixed hyperlipidemia: Secondary | ICD-10-CM

## 2017-02-14 DIAGNOSIS — R748 Abnormal levels of other serum enzymes: Secondary | ICD-10-CM | POA: Diagnosis not present

## 2017-02-15 LAB — COMPREHENSIVE METABOLIC PANEL
ALBUMIN: 4.3 g/dL (ref 3.5–5.5)
ALK PHOS: 68 IU/L (ref 39–117)
ALT: 61 IU/L — ABNORMAL HIGH (ref 0–32)
AST: 33 IU/L (ref 0–40)
Albumin/Globulin Ratio: 2 (ref 1.2–2.2)
BUN / CREAT RATIO: 23 (ref 9–23)
BUN: 16 mg/dL (ref 6–24)
Bilirubin Total: 0.4 mg/dL (ref 0.0–1.2)
CALCIUM: 9.5 mg/dL (ref 8.7–10.2)
CO2: 25 mmol/L (ref 20–29)
CREATININE: 0.69 mg/dL (ref 0.57–1.00)
Chloride: 101 mmol/L (ref 96–106)
GFR, EST AFRICAN AMERICAN: 117 mL/min/{1.73_m2} (ref >59)
GFR, EST NON AFRICAN AMERICAN: 102 mL/min/{1.73_m2} (ref >59)
GLUCOSE: 94 mg/dL (ref 65–99)
Globulin, Total: 2.1 g/dL (ref 1.5–4.5)
Potassium: 4.2 mmol/L (ref 3.5–5.2)
Sodium: 140 mmol/L (ref 134–144)
TOTAL PROTEIN: 6.4 g/dL (ref 6.0–8.5)

## 2017-02-15 LAB — LIPID PANEL W/O CHOL/HDL RATIO
CHOLESTEROL TOTAL: 237 mg/dL — AB (ref 100–199)
HDL: 55 mg/dL (ref >39)
LDL CALC: 153 mg/dL — AB (ref 0–99)
Triglycerides: 145 mg/dL (ref 0–149)
VLDL CHOLESTEROL CAL: 29 mg/dL (ref 5–40)

## 2017-02-21 ENCOUNTER — Telehealth: Payer: Self-pay | Admitting: Family Medicine

## 2017-02-21 NOTE — Telephone Encounter (Signed)
Spoke with patient, she wanted to confirm her lab results. Notified her that the levels have come down.

## 2017-03-05 ENCOUNTER — Encounter: Payer: Self-pay | Admitting: Family Medicine

## 2017-03-05 ENCOUNTER — Ambulatory Visit (INDEPENDENT_AMBULATORY_CARE_PROVIDER_SITE_OTHER): Payer: BLUE CROSS/BLUE SHIELD | Admitting: Family Medicine

## 2017-03-05 VITALS — BP 121/87 | HR 89 | Temp 98.0°F | Ht 65.3 in | Wt 174.4 lb

## 2017-03-05 DIAGNOSIS — R232 Flushing: Secondary | ICD-10-CM

## 2017-03-05 DIAGNOSIS — Z8541 Personal history of malignant neoplasm of cervix uteri: Secondary | ICD-10-CM | POA: Diagnosis not present

## 2017-03-05 DIAGNOSIS — Z0000 Encounter for general adult medical examination without abnormal findings: Secondary | ICD-10-CM

## 2017-03-05 MED ORDER — GABAPENTIN 100 MG PO CAPS: 100.0000 mg | ORAL_CAPSULE | Freq: Every day | ORAL | 3 refills | Status: DC

## 2017-03-05 NOTE — Assessment & Plan Note (Signed)
Pap done today- usually done every 2 years

## 2017-03-05 NOTE — Progress Notes (Signed)
BP 121/87 (BP Location: Left Arm, Patient Position: Sitting, Cuff Size: Normal)   Pulse 89   Temp 98 F (36.7 C)   Ht 5' 5.3" (1.659 m)   Wt 174 lb 6 oz (79.1 kg)   SpO2 98%   BMI 28.75 kg/m    Subjective:    Patient ID: Tracey Weaver, female    DOB: Jun 10, 1966, 51 y.o.   MRN: [id number]  HPI: Tracey Weaver is a 51 y.o. female presenting on 03/05/2017 for comprehensive medical examination. Current medical complaints include:  MENOPAUSAL SYMPTOMS Duration: 2 months Symptom severity: severe Hot flashes: yes Night sweats: yes Sleep disturbances: yes Vaginal dryness: no Dyspareunia:no Decreased libido: yes Emotional lability: yes Stress incontinence: yes Previous HRT/pharmacotherapy: no Hysterectomy: yes- for precancerous cells GYN surgery: hysterectomy  She currently lives with: husband and kids Menopausal Symptoms: yes  Depression Screen done today and results listed below:  Depression screen [facility] 2/9 11/13/2016 07/30/2016 07/16/2016 02/13/2015 11/30/2013  Decreased Interest 0 0 0 0 0  Down, Depressed, Hopeless 0 0 0 0 0  PHQ - 2 Score 0 0 0 0 0    Past Medical History:  Past Medical History:  Diagnosis Date  . Adjustment disorder with depressed mood   . Anxiety   . Cervical cancer (HCC)    precancerous cells  . Chest pain   . Constipation   . Degenerative disc disease, cervical   . Dysplastic nevus 08/27/2013   s/p resection by Dr. Aubery Lapping.  . Dysthymic disorder   . History of ectopic pregnancy    Multiple  . IBS (irritable bowel syndrome)    Constipation; Rx for Amitiza worked well.  . Interstitial cystitis    [facility]/U[facility]; s/p bladder biopsy; s/p cystoscopy x 3 in 2015.  [name] Kidney stones   . Liver function study, abnormal   . Pneumonia   . Pyelonephritis   . Recurrent UTI   . Splenomegaly    s/p GI consult and hematology consult  . Ulcer 09/28/2011   EGD: +gastric ulcer; rx for Dexilant.  [name].    Surgical History:  Past Surgical History:    Procedure Laterality Date  . ABDOMINAL EXPLORATION SURGERY  1997   ruptured tube from eptopic pregnancy  . ABDOMINAL HYSTERECTOMY  08/27/1998   cervical dysplasia;one ovary remaining  . CESAREAN SECTION     x 2.  . COLONOSCOPY  08/27/2010   [name].    . CYSTOSTOMY W/ BLADDER BIOPSY  05/27/2014   [name]. Benign.  . ESOPHAGOGASTRODUODENOSCOPY  09/28/2011   +gastric ulcer.  [name].  . TONSILLECTOMY  05/27/2014   [facility].    Medications:  Current Outpatient Prescriptions on File Prior to Visit  Medication Sig  . cyclobenzaprine (FLEXERIL) 5 MG tablet Take 1 tablet (5 mg total) by mouth 3 (three) times daily as needed.  . diclofenac (VOLTAREN) 75 MG EC tablet Take 1 tablet (75 mg total) by mouth 2 (two) times daily as needed.  . rosuvastatin (CRESTOR) 10 MG tablet Take 1 tablet (10 mg total) by mouth daily.   No current facility-administered medications on file prior to visit.     Allergies:  Allergies  Allergen Reactions  . Meloxicam Other (See Comments)    "MAKES ME FEEL FUNNY", Dizziness    Social History:  Social History   Social History  . Marital status: Married    Spouse name: N/A  . Number of children: 2  . Years of education: 14   Occupational History  . hairstylist Self Employed  Social History Main Topics  . Smoking status: Never Smoker  . Smokeless tobacco: Never Used  . Alcohol use 0.0 oz/week     Comment: 1-2 drinks per month  . Drug use: No  . Sexual activity: Yes    Partners: Male    Birth control/ protection: Surgical   Other Topics Concern  . 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      Tobacco:  None      Alcohol: 1-2 servings per month.      Drugs:  none      Exercise:  none      Caffeine use: Coffee, 1 serving per day   Always uses seat belts, smoke  alarm and carbon monoxide detector in the home, Guns locked up.   Organ Donor: YES   Patient DOES not have living will, DOES not have HCPOA.            History  Smoking Status  . Never Smoker  Smokeless Tobacco  . Never Used   History  Alcohol Use  . 0.0 oz/week    Comment: 1-2 drinks per month    Family History:  Family History  Problem Relation Age of Onset  . Hypertension Mother   . COPD Mother   . Hyperlipidemia Mother   . Alcohol abuse Mother   . Colon polyps Mother   . Heart attack Mother   . Heart disease Mother 12       AMI x 2; stents.  PAD s/p stenting.  . Cancer Father        prostate cancer with mets  . Diabetes Father   . Heart disease Father 70       AMI age 33  . Hyperlipidemia Father   . Hypertension Father   . Mental illness Brother        paranoid schizophrenia  . Hypothyroidism Brother   . Hyperlipidemia Brother   . Stroke Maternal Grandmother   . Heart disease Maternal Grandmother   . Hypertension Maternal Grandmother   . Stroke Maternal Grandfather   . Heart disease Maternal Grandfather   . Hypertension Maternal Grandfather   . Cancer Paternal Grandmother   . Heart disease Paternal Grandmother   . Diabetes Paternal Grandmother   . Stroke Paternal Grandfather   . Heart disease Paternal Grandfather   . Cancer Brother 40       lung, germ cell; lobectomy at 32  . Drug abuse Brother   . Hypertension Brother   . Epilepsy Son   . Diabetes Son     Past medical history, surgical history, medications, allergies, family history and social history reviewed with patient today and changes made to appropriate areas of the chart.   Review of Systems  Constitutional: Negative.   HENT: Negative.   Eyes: Negative.   Respiratory: Negative.   Cardiovascular: Negative.   Gastrointestinal: Negative.   Genitourinary: Negative.   Musculoskeletal: Negative.   Skin: Negative.   Neurological: Negative.   Endo/Heme/Allergies: Negative.     Psychiatric/Behavioral: Negative.    All other ROS negative except what is listed above and in the HPI.      Objective:    BP 121/87 (BP Location: Left Arm, Patient Position: Sitting, Cuff Size:  Normal)   Pulse 89   Temp 98 F (36.7 C)   Ht 5' 5.3" (1.659 m)   Wt 174 lb 6 oz (79.1 kg)   SpO2 98%   BMI 28.75 kg/m   Wt Readings from Last 3 Encounters:  03/05/17 174 lb 6 oz (79.1 kg)  01/31/17 175 lb 8 oz (79.6 kg)  01/15/17 171 lb 8 oz (77.8 kg)    Physical Exam  Constitutional: She is oriented to person, place, and time. She appears well-developed and well-nourished. No distress.  HENT:  Head: Normocephalic and atraumatic.  Right Ear: Hearing and external ear normal.  Left Ear: Hearing and external ear normal.  Nose: Nose normal.  Mouth/Throat: Oropharynx is clear and moist. No oropharyngeal exudate.  Eyes: Conjunctivae, EOM and lids are normal. Pupils are equal, round, and reactive to light. Right eye exhibits no discharge. Left eye exhibits no discharge. No scleral icterus.  Neck: Normal range of motion. Neck supple. No JVD present. No tracheal deviation present. No thyromegaly present.  Cardiovascular: Normal rate, regular rhythm, normal heart sounds and intact distal pulses.  Exam reveals no gallop and no friction rub.   No murmur heard. Pulmonary/Chest: Effort normal and breath sounds normal. No stridor. No respiratory distress. She has no wheezes. She has no rales. She exhibits no tenderness. Right breast exhibits no inverted nipple, no mass, no nipple discharge, no skin change and no tenderness. Left breast exhibits no inverted nipple, no mass, no nipple discharge, no skin change and no tenderness. Breasts are symmetrical.  Abdominal: Soft. Bowel sounds are normal. She exhibits no distension and no mass. There is no tenderness. There is no rebound and no guarding. Hernia confirmed negative in the right inguinal area and confirmed negative in the left inguinal area.   Genitourinary: Vagina normal. No breast swelling, tenderness, discharge or bleeding. No labial fusion. There is no rash, tenderness, lesion or injury on the right labia. There is no rash, tenderness, lesion or injury on the left labia. No erythema, tenderness or bleeding in the vagina. No foreign body in the vagina. No signs of injury around the vagina. No vaginal discharge found.  Musculoskeletal: Normal range of motion. She exhibits no edema, tenderness or deformity.  Lymphadenopathy:    She has no cervical adenopathy.  Neurological: She is alert and oriented to person, place, and time. She has normal reflexes. She displays normal reflexes. No cranial nerve deficit. She exhibits normal muscle tone. Coordination normal.  Skin: Skin is warm, dry and intact. No rash noted. She is not diaphoretic. No erythema. No pallor.  Psychiatric: She has a normal mood and affect. Her speech is normal and behavior is normal. Judgment and thought content normal. Cognition and memory are normal.  Nursing note and vitals reviewed.   Results for orders placed or performed in visit on 02/14/17  Comprehensive metabolic panel  Result Value Ref Range   Glucose 94 65 - 99 mg/dL   BUN 16 6 - 24 mg/dL   Creatinine, Ser 0.69 0.57 - 1.00 mg/dL   GFR calc non Af Amer 102 >59 mL/min/1.73   GFR calc Af Amer 117 >59 mL/min/1.73   BUN/Creatinine Ratio 23 9 - 23   Sodium 140 134 - 144 mmol/L   Potassium 4.2 3.5 - 5.2 mmol/L   Chloride 101 96 - 106 mmol/L   CO2 25 20 - 29 mmol/L   Calcium 9.5 8.7 - 10.2 mg/dL   Total Protein 6.4 6.0 - 8.5 g/dL   Albumin 4.3  3.5 - 5.5 g/dL   Globulin, Total 2.1 1.5 - 4.5 g/dL   Albumin/Globulin Ratio 2.0 1.2 - 2.2   Bilirubin Total 0.4 0.0 - 1.2 mg/dL   Alkaline Phosphatase 68 39 - 117 IU/L   AST 33 0 - 40 IU/L   ALT 61 (H) 0 - 32 IU/L  Lipid Panel w/o Chol/HDL Ratio  Result Value Ref Range   Cholesterol, Total 237 (H) 100 - 199 mg/dL   Triglycerides 145 0 - 149 mg/dL   HDL 55 >39  mg/dL   VLDL Cholesterol Cal 29 5 - 40 mg/dL   LDL Calculated 153 (H) 0 - 99 mg/dL      Assessment & Plan:   Problem List Items Addressed This Visit      Cardiovascular and Mediastinum   Hot flashes    Will treat with gabapentin due to strong family history of coronary disease. Recheck 1 month.         Other   History of cervical cancer    Pap done today- usually done every 2 years      Relevant Orders   IGP, Aptima HPV, rfx 16/18,45    Other Visit Diagnoses    Routine general medical examination at a health care facility    -  Primary   Vaccines up to date. Screening labs checked last visit. Pap done today. Mammogram and colonoscopy up to date. Continue diet and exercise.        Follow up plan: Return in about 4 weeks (around 04/02/2017) for Follow up hot flashes.   LABORATORY TESTING:  - Pap smear: pap done  IMMUNIZATIONS:   - Tdap: Tetanus vaccination status reviewed: last tetanus booster within 10 years. - Influenza: Postponed to flu season - Pneumovax: Not applicable  SCREENING: -Mammogram: Up to date  - Colonoscopy: Up to date  - Bone Density: Not applicable   PATIENT COUNSELING:   Advised to take 1 mg of folate supplement per day if capable of pregnancy.   Sexuality: Discussed sexually transmitted diseases, partner selection, use of condoms, avoidance of unintended pregnancy  and contraceptive alternatives.   Advised to avoid cigarette smoking.   I discussed with the patient that most people either abstain from alcohol or drink within safe limits (<=14/week and <=4 drinks/occasion for males, <=7/weeks and <= 3 drinks/occasion for females) and that the risk for alcohol disorders and other health effects rises proportionally with the number of drinks per week and how often a drinker exceeds daily limits.  Discussed cessation/primary prevention of drug use and availability of treatment for abuse.   Diet: Encouraged to adjust caloric intake to maintain  or  achieve ideal body weight, to reduce intake of dietary saturated fat and total fat, to limit sodium intake by avoiding high sodium foods and not adding table salt, and to maintain adequate dietary potassium and calcium preferably from fresh fruits, vegetables, and low-fat dairy products.    stressed the importance of regular exercise  Injury prevention: Discussed safety belts, safety helmets, smoke detector, smoking near bedding or upholstery.   Dental health: Discussed importance of regular tooth brushing, flossing, and dental visits.    NEXT PREVENTATIVE PHYSICAL DUE IN 1 YEAR. Return in about 4 weeks (around 04/02/2017) for Follow up hot flashes.

## 2017-03-05 NOTE — Patient Instructions (Addendum)

## 2017-03-05 NOTE — Assessment & Plan Note (Signed)
Will treat with gabapentin due to strong family history of coronary disease. Recheck 1 month.

## 2017-03-07 LAB — IGP, APTIMA HPV, RFX 16/18,45
HPV APTIMA: NEGATIVE
PAP SMEAR COMMENT: 0

## 2017-03-13 ENCOUNTER — Telehealth: Payer: Self-pay | Admitting: Family Medicine

## 2017-03-13 NOTE — Telephone Encounter (Signed)
Patient would like to speak with Tiffany or Dr Wynetta Emery regarding the information that was discussed at her last visit.  [phone]  Thank you

## 2017-03-13 NOTE — Telephone Encounter (Signed)
Pt scheduled for Monday.

## 2017-03-13 NOTE — Telephone Encounter (Signed)
Needs appointment. We did not recommend that and started her on gabapentin.

## 2017-03-13 NOTE — Telephone Encounter (Signed)
Patient states that she would like to try a hormone replacement.

## 2017-03-18 ENCOUNTER — Encounter: Payer: Self-pay | Admitting: Family Medicine

## 2017-03-18 ENCOUNTER — Ambulatory Visit (INDEPENDENT_AMBULATORY_CARE_PROVIDER_SITE_OTHER): Payer: BLUE CROSS/BLUE SHIELD | Admitting: Family Medicine

## 2017-03-18 VITALS — BP 127/85 | HR 82 | Temp 98.2°F | Wt 175.7 lb

## 2017-03-18 DIAGNOSIS — R232 Flushing: Secondary | ICD-10-CM | POA: Diagnosis not present

## 2017-03-18 MED ORDER — ESTROGENS CONJUGATED 0.3 MG PO TABS: 0.3000 mg | ORAL_TABLET | Freq: Every day | ORAL | 3 refills | Status: DC

## 2017-03-18 NOTE — Progress Notes (Signed)
BP 127/85 (BP Location: Left Arm, Patient Position: Sitting, Cuff Size: Normal)   Pulse 82   Temp 98.2 F (36.8 C)   Wt 175 lb 11.2 oz (79.7 kg)   SpO2 98%   BMI 28.97 kg/m    Subjective:    Patient ID: Tracey Weaver, female    DOB: Jun 18, 1966, 51 y.o.   MRN: [id number]  HPI: Tracey Weaver is a 51 y.o. female  Chief Complaint  Patient presents with  . Night Sweats    Barely sleeps at night. Wants to talk about hormone replacement therapy.  . Weight Gain   MENOPAUSAL SYMPTOMS- Not doing well. Didn't do well on the gabapentin Duration: exacerbated Symptom severity: severe Hot flashes: yes Night sweats: yes Sleep disturbances: yes Vaginal dryness: no Dyspareunia:no Decreased libido: yes Emotional lability: yes Stress incontinence: no Previous HRT/pharmacotherapy: no Hysterectomy: yes GYN surgery: hysterectomy Absolute Contraindications to Hormonal Therapy:     Undiagnosed vaginal bleeding: no    Breast cancer: no    Endometrial cancer: no    Coronary disease: no    Cerebrovascular disease: no    Venous thromboembolic disease: no   Relevant past medical, surgical, family and social history reviewed and updated as indicated. Interim medical history since our last visit reviewed. Allergies and medications reviewed and updated.  Review of Systems  Constitutional: Negative.   Respiratory: Negative.   Cardiovascular: Negative.   Endocrine: Positive for heat intolerance. Negative for cold intolerance, polydipsia, polyphagia and polyuria.  Genitourinary: Negative for decreased urine volume, difficulty urinating, dyspareunia, dysuria, enuresis, flank pain, frequency, genital sores, hematuria, menstrual problem, pelvic pain, urgency, vaginal bleeding, vaginal discharge and vaginal pain.  Psychiatric/Behavioral: Negative.     Per HPI unless specifically indicated above     Objective:    BP 127/85 (BP Location: Left Arm, Patient Position: Sitting, Cuff Size: Normal)    Pulse 82   Temp 98.2 F (36.8 C)   Wt 175 lb 11.2 oz (79.7 kg)   SpO2 98%   BMI 28.97 kg/m   Wt Readings from Last 3 Encounters:  03/18/17 175 lb 11.2 oz (79.7 kg)  03/05/17 174 lb 6 oz (79.1 kg)  01/31/17 175 lb 8 oz (79.6 kg)    Physical Exam  Constitutional: She is oriented to person, place, and time. She appears well-developed and well-nourished. No distress.  HENT:  Head: Normocephalic and atraumatic.  Right Ear: Hearing normal.  Left Ear: Hearing normal.  Nose: Nose normal.  Eyes: Conjunctivae and lids are normal. Right eye exhibits no discharge. Left eye exhibits no discharge. No scleral icterus.  Cardiovascular: Normal rate, regular rhythm, normal heart sounds and intact distal pulses.  Exam reveals no gallop and no friction rub.   No murmur heard. Pulmonary/Chest: Effort normal and breath sounds normal. No respiratory distress. She has no wheezes. She has no rales. She exhibits no tenderness.  Musculoskeletal: Normal range of motion.  Neurological: She is alert and oriented to person, place, and time.  Skin: Skin is warm, dry and intact. No rash noted. She is not diaphoretic. No erythema. No pallor.  Psychiatric: She has a normal mood and affect. Her speech is normal and behavior is normal. Judgment and thought content normal. Cognition and memory are normal.    Results for orders placed or performed in visit on 03/05/17  IGP, Aptima HPV, rfx 16/18,45  Result Value Ref Range   DIAGNOSIS: Comment    Specimen adequacy: Comment    Clinician Provided ICD10 Comment  Performed by: Comment    PAP Smear Comment .    Note: Comment    Test Methodology Comment    HPV Aptima Negative Negative      Assessment & Plan:   Problem List Items Addressed This Visit      Cardiovascular and Mediastinum   Hot flashes - Primary    No better on gabapentin, feeling terrible. Would like to start HRT. Discussed risks and benefits, she would like to proceed. Rx for low dose premarin  given today. Will check in by phone in 2 weeks and adjust dose as needed. Follow up here in 1 month.           Follow up plan: Return in about 4 weeks (around 04/15/2017) for follow up HRT.

## 2017-03-18 NOTE — Assessment & Plan Note (Signed)
No better on gabapentin, feeling terrible. Would like to start HRT. Discussed risks and benefits, she would like to proceed. Rx for low dose premarin given today. Will check in by phone in 2 weeks and adjust dose as needed. Follow up here in 1 month.

## 2017-03-18 NOTE — Patient Instructions (Addendum)

## 2017-03-27 DIAGNOSIS — M2141 Flat foot [pes planus] (acquired), right foot: Secondary | ICD-10-CM

## 2017-03-27 DIAGNOSIS — M2142 Flat foot [pes planus] (acquired), left foot: Principal | ICD-10-CM

## 2017-03-27 DIAGNOSIS — M76829 Posterior tibial tendinitis, unspecified leg: Secondary | ICD-10-CM | POA: Insufficient documentation

## 2017-04-08 ENCOUNTER — Encounter: Payer: Self-pay | Admitting: Family Medicine

## 2017-04-08 ENCOUNTER — Ambulatory Visit (INDEPENDENT_AMBULATORY_CARE_PROVIDER_SITE_OTHER): Payer: BLUE CROSS/BLUE SHIELD | Admitting: Family Medicine

## 2017-04-08 VITALS — BP 125/84 | HR 77 | Temp 98.1°F | Wt 176.5 lb

## 2017-04-08 DIAGNOSIS — R232 Flushing: Secondary | ICD-10-CM | POA: Diagnosis not present

## 2017-04-08 MED ORDER — ESTROGENS CONJUGATED 0.625 MG PO TABS: 0.6250 mg | ORAL_TABLET | Freq: Every day | ORAL | 3 refills | Status: DC

## 2017-04-08 NOTE — Progress Notes (Signed)
BP 125/84 (BP Location: Left Arm, Patient Position: Sitting, Cuff Size: Normal)   Pulse 77   Temp 98.1 F (36.7 C)   Wt 176 lb 8 oz (80.1 kg)   SpO2 100%   BMI 29.10 kg/m    Subjective:    Patient ID: Tracey Weaver, female    DOB: [birth year], 51 y.o.   MRN: [id number]  HPI: Tracey Weaver is a 51 y.o. female  Chief Complaint  Patient presents with  . Hot Flashes    Patient states that she was better for about two weeks and lover the last 5 days it has been horrible, not being able to sleep.    MENOPAUSAL SYMPTOMS- did really well for about 2 weeks, then within the last 5 days, she notes that she has woken up 7-8x a night because of the sweats Duration: No change from [place] starting the medicine Symptom severity: severe Hot flashes: yes Night sweats: yes Sleep disturbances: yes Vaginal dryness: no Dyspareunia:no Decreased libido: yes Emotional lability: yes Stress incontinence: no Previous HRT/pharmacotherapy: current Hysterectomy: yes GYN surgery: hysterectomy Absolute Contraindications to Hormonal Therapy:     Undiagnosed vaginal bleeding: no    Breast cancer: no    Endometrial cancer: no    Coronary disease: no    Cerebrovascular disease: no    Venous thromboembolic disease: no  Relevant past medical, surgical, family and social history reviewed and updated as indicated. Interim medical history since our last visit reviewed. Allergies and medications reviewed and updated.  Review of Systems  Constitutional: Positive for diaphoresis. Negative for activity change, appetite change, chills, fatigue, fever and unexpected weight change.  Respiratory: Negative.   Cardiovascular: Negative.   Psychiatric/Behavioral: Positive for dysphoric mood. Negative for agitation, behavioral problems, confusion, decreased concentration, hallucinations, self-injury, sleep disturbance and suicidal ideas. The patient is not nervous/anxious and is not hyperactive.     Per HPI unless  specifically indicated above     Objective:    BP 125/84 (BP Location: Left Arm, Patient Position: Sitting, Cuff Size: Normal)   Pulse 77   Temp 98.1 F (36.7 C)   Wt 176 lb 8 oz (80.1 kg)   SpO2 100%   BMI 29.10 kg/m   Wt Readings from Last 3 Encounters:  04/08/17 176 lb 8 oz (80.1 kg)  03/18/17 175 lb 11.2 oz (79.7 kg)  03/05/17 174 lb 6 oz (79.1 kg)    Physical Exam  Constitutional: She is oriented to person, place, and time. She appears well-developed and well-nourished. No distress.  HENT:  Head: Normocephalic and atraumatic.  Right Ear: Hearing normal.  Left Ear: Hearing normal.  Nose: Nose normal.  Eyes: Conjunctivae and lids are normal. Right eye exhibits no discharge. Left eye exhibits no discharge. No scleral icterus.  Cardiovascular: Normal rate, regular rhythm, normal heart sounds and intact distal pulses.  Exam reveals no gallop and no friction rub.   No murmur heard. Pulmonary/Chest: Effort normal and breath sounds normal. No respiratory distress. She has no wheezes. She has no rales. She exhibits no tenderness.  Musculoskeletal: Normal range of motion.  Neurological: She is alert and oriented to person, place, and time.  Skin: Skin is warm, dry and intact. No rash noted. She is not diaphoretic. No erythema. No pallor.  Psychiatric: She has a normal mood and affect. Her speech is normal and behavior is normal. Judgment and thought content normal. Cognition and memory are normal.  Nursing note and vitals reviewed.   Results for orders placed  or performed in visit on 03/05/17  IGP, Aptima HPV, rfx 16/18,45  Result Value Ref Range   DIAGNOSIS: Comment    Specimen adequacy: Comment    Clinician Provided ICD10 Comment    Performed by: Comment    PAP Smear Comment .    Note: Comment    Test Methodology Comment    HPV Aptima Negative Negative      Assessment & Plan:   Problem List Items Addressed This Visit      Cardiovascular and Mediastinum   Hot flashes  - Primary    Still not under great control. Increase to 0.625mg  and recheck in 1 month. Call with any concerns.          Follow up plan: Return in about 4 weeks (around 05/06/2017) for Follow up hot flashes.

## 2017-04-08 NOTE — Assessment & Plan Note (Signed)
Still not under great control. Increase to 0.625mg  and recheck in 1 month. Call with any concerns.

## 2017-04-22 ENCOUNTER — Telehealth: Payer: Self-pay | Admitting: Family Medicine

## 2017-04-22 MED ORDER — ESTROGENS CONJUGATED 0.625 MG PO TABS: 0.6250 mg | ORAL_TABLET | Freq: Every day | ORAL | 6 refills | Status: DC

## 2017-04-22 NOTE — Telephone Encounter (Signed)
-----   Message from Valerie Roys, DO sent at 04/08/2017  1:20 PM EDT ----- Call about hot flashes

## 2017-04-22 NOTE — Telephone Encounter (Signed)
Called to check on hot flashes. Doing well. No flashes. Continue current regimen. Refill given. Call with any concerns.

## 2017-05-13 ENCOUNTER — Ambulatory Visit: Payer: BLUE CROSS/BLUE SHIELD | Admitting: Family Medicine

## 2017-05-24 ENCOUNTER — Encounter: Payer: Self-pay | Admitting: Family Medicine

## 2017-05-24 ENCOUNTER — Ambulatory Visit (INDEPENDENT_AMBULATORY_CARE_PROVIDER_SITE_OTHER): Payer: BLUE CROSS/BLUE SHIELD | Admitting: Family Medicine

## 2017-05-24 VITALS — BP 136/85 | HR 81 | Temp 98.3°F | Wt 175.2 lb

## 2017-05-24 DIAGNOSIS — N3010 Interstitial cystitis (chronic) without hematuria: Secondary | ICD-10-CM

## 2017-05-24 DIAGNOSIS — R300 Dysuria: Secondary | ICD-10-CM | POA: Diagnosis not present

## 2017-05-24 MED ORDER — PHENAZOPYRIDINE HCL 200 MG PO TABS: 200.0000 mg | ORAL_TABLET | Freq: Three times a day (TID) | ORAL | 0 refills | Status: DC | PRN

## 2017-05-24 MED ORDER — NITROFURANTOIN MONOHYD MACRO 100 MG PO CAPS: 100.0000 mg | ORAL_CAPSULE | Freq: Two times a day (BID) | ORAL | 0 refills | Status: DC

## 2017-05-24 NOTE — Progress Notes (Signed)
BP 136/85 (BP Location: Left Arm, Patient Position: Sitting, Cuff Size: Normal)   Pulse 81   Temp 98.3 F (36.8 C)   Wt 175 lb 3 oz (79.5 kg)   SpO2 99%   BMI 28.89 kg/m    Subjective:    Patient ID: Tracey Weaver, female    DOB: 03-Feb-1966, 51 y.o.   MRN: [id number]  HPI: Tracey Weaver is a 51 y.o. female  Chief Complaint  Patient presents with  . Urinary Tract Infection   URINARY SYMPTOMS- thinks that she is having a UC flare, can't get into see urology until November Duration: 2 weeks off and on Dysuria: burning Urinary frequency: no Urgency: no Small volume voids: no Symptom severity: moderate Urinary incontinence: no Foul odor: no Hematuria: no Abdominal pain: yes  Back pain: no Suprapubic pain/pressure: no Flank pain: no Fever:  no Vomiting: no Relief with cranberry juice: no Relief with pyridium: no Status: worse Previous urinary tract infection: yes Recurrent urinary tract infection: no Sexual activity: monogomous History of sexually transmitted disease: no Vaginal discharge: no Treatments attempted: pyridium and increasing fluids    Relevant past medical, surgical, family and social history reviewed and updated as indicated. Interim medical history since our last visit reviewed. Allergies and medications reviewed and updated.  Review of Systems  Constitutional: Negative.   Respiratory: Negative.   Cardiovascular: Negative.   Genitourinary: Positive for dysuria and vaginal pain. Negative for decreased urine volume, difficulty urinating, dyspareunia, enuresis, flank pain, frequency, genital sores, hematuria, menstrual problem, pelvic pain, urgency, vaginal bleeding and vaginal discharge.  Psychiatric/Behavioral: Negative.     Per HPI unless specifically indicated above     Objective:    BP 136/85 (BP Location: Left Arm, Patient Position: Sitting, Cuff Size: Normal)   Pulse 81   Temp 98.3 F (36.8 C)   Wt 175 lb 3 oz (79.5 kg)   SpO2 99%    BMI 28.89 kg/m   Wt Readings from Last 3 Encounters:  05/24/17 175 lb 3 oz (79.5 kg)  04/08/17 176 lb 8 oz (80.1 kg)  03/18/17 175 lb 11.2 oz (79.7 kg)    Physical Exam  Constitutional: She is oriented to person, place, and time. She appears well-developed and well-nourished. No distress.  HENT:  Head: Normocephalic and atraumatic.  Right Ear: Hearing normal.  Left Ear: Hearing normal.  Nose: Nose normal.  Eyes: Conjunctivae and lids are normal. Right eye exhibits no discharge. Left eye exhibits no discharge. No scleral icterus.  Cardiovascular: Normal rate, regular rhythm, normal heart sounds and intact distal pulses.  Exam reveals no gallop and no friction rub.   No murmur heard. Pulmonary/Chest: Effort normal and breath sounds normal. No respiratory distress. She has no wheezes. She has no rales. She exhibits no tenderness.  Musculoskeletal: Normal range of motion.  Neurological: She is alert and oriented to person, place, and time.  Skin: Skin is warm, dry and intact. No rash noted. She is not diaphoretic. No erythema. No pallor.  Psychiatric: She has a normal mood and affect. Her speech is normal and behavior is normal. Judgment and thought content normal. Cognition and memory are normal.  Nursing note and vitals reviewed.   Results for orders placed or performed in visit on 03/05/17  IGP, Aptima HPV, rfx 16/18,45  Result Value Ref Range   DIAGNOSIS: Comment    Specimen adequacy: Comment    Clinician Provided ICD10 Comment    Performed by: Comment    PAP Smear Comment .  Note: Comment    Test Methodology Comment    HPV Aptima Negative Negative      Assessment & Plan:   Problem List Items Addressed This Visit      Genitourinary   IC (interstitial cystitis) - Primary    Can't get in to see her urologist until November. Will treat with macrobid and pyridium for spasm now. Call if not improving and we can consider amitriptyline and check to see what she has been given  previously. Call with any concerns.       Relevant Orders   UA/M w/rflx Culture, Routine    Other Visit Diagnoses    Dysuria       Urine clear- flare of her IC.    Relevant Orders   UA/M w/rflx Culture, Routine       Follow up plan: Return if symptoms worsen or fail to improve.

## 2017-05-24 NOTE — Assessment & Plan Note (Signed)
Can't get in to see her urologist until November. Will treat with macrobid and pyridium for spasm now. Call if not improving and we can consider amitriptyline and check to see what she has been given previously. Call with any concerns.

## 2017-05-25 LAB — MICROSCOPIC EXAMINATION

## 2017-05-25 LAB — UA/M W/RFLX CULTURE, ROUTINE
BILIRUBIN UA: NEGATIVE
Glucose, UA: NEGATIVE
KETONES UA: NEGATIVE
LEUKOCYTES UA: NEGATIVE
Nitrite, UA: NEGATIVE
PH UA: 6.5 (ref 5.0–7.5)
PROTEIN UA: NEGATIVE
RBC UA: NEGATIVE
SPEC GRAV UA: 1.02 (ref 1.005–1.030)
UUROB: 0.2 mg/dL (ref 0.2–1.0)

## 2017-06-12 ENCOUNTER — Telehealth: Payer: Self-pay | Admitting: Family Medicine

## 2017-06-12 NOTE — Telephone Encounter (Signed)
Patient is going on Cruise Friday and would like to have some those patches called in to help with motion sickness.   [facility] [phone]  Thank You

## 2017-06-13 MED ORDER — SCOPOLAMINE 1 MG/3DAYS TD PT72: 1.0000 | MEDICATED_PATCH | TRANSDERMAL | 1 refills | Status: DC

## 2017-06-13 NOTE — Telephone Encounter (Signed)
Routing to provider  

## 2017-07-03 ENCOUNTER — Other Ambulatory Visit: Payer: Self-pay | Admitting: Family Medicine

## 2017-07-03 NOTE — Telephone Encounter (Signed)
Rx refill

## 2017-07-29 ENCOUNTER — Other Ambulatory Visit: Payer: Self-pay | Admitting: *Deleted

## 2017-07-29 MED ORDER — ROSUVASTATIN CALCIUM 10 MG PO TABS
10.0000 mg | ORAL_TABLET | Freq: Every day | ORAL | 2 refills | Status: DC
Start: 2017-07-29 — End: 2018-03-13

## 2017-08-01 ENCOUNTER — Encounter: Payer: Self-pay | Admitting: Family Medicine

## 2017-08-01 ENCOUNTER — Ambulatory Visit (INDEPENDENT_AMBULATORY_CARE_PROVIDER_SITE_OTHER): Payer: BLUE CROSS/BLUE SHIELD | Admitting: Family Medicine

## 2017-08-01 VITALS — BP 134/92 | HR 89 | Temp 97.9°F | Wt 174.0 lb

## 2017-08-01 DIAGNOSIS — B029 Zoster without complications: Secondary | ICD-10-CM | POA: Diagnosis not present

## 2017-08-01 DIAGNOSIS — N644 Mastodynia: Secondary | ICD-10-CM

## 2017-08-01 MED ORDER — VALACYCLOVIR HCL 1 G PO TABS: 1000.0000 mg | ORAL_TABLET | Freq: Two times a day (BID) | ORAL | 0 refills | Status: DC

## 2017-08-01 NOTE — Patient Instructions (Addendum)
Shingles Shingles, which is also known as herpes zoster, is an infection that causes a painful skin rash and fluid-filled blisters. Shingles is not related to genital herpes, which is a sexually transmitted infection. Shingles only develops in people who:  Have had chickenpox.  Have received the chickenpox vaccine. (This is rare.)  What are the causes? Shingles is caused by varicella-zoster virus (VZV). This is the same virus that causes chickenpox. After exposure to VZV, the virus stays in the body in an inactive (dormant) state. Shingles develops if the virus reactivates. This can happen many years after the initial exposure to VZV. It is not known what causes this virus to reactivate. What increases the risk? People who have had chickenpox or received the chickenpox vaccine are at risk for shingles. Infection is more common in people who:  Are older than age 50.  Have a weakened defense (immune) system, such as those with HIV, AIDS, or cancer.  Are taking medicines that weaken the immune system, such as transplant medicines.  Are under great stress.  What are the signs or symptoms? Early symptoms of this condition include itching, tingling, and pain in an area on your skin. Pain may be described as burning, stabbing, or throbbing. A few days or weeks after symptoms start, a painful red rash appears, usually on one side of the body in a bandlike or beltlike pattern. The rash eventually turns into fluid-filled blisters that break open, scab over, and dry up in about 2-3 weeks. At any time during the infection, you may also develop:  A fever.  Chills.  A headache.  An upset stomach.  How is this diagnosed? This condition is diagnosed with a skin exam. Sometimes, skin or fluid samples are taken from the blisters before a diagnosis is made. These samples are examined under a microscope or sent to a lab for testing. How is this treated? There is no specific cure for this condition.  Your health care provider will probably prescribe medicines to help you manage pain, recover more quickly, and avoid long-term problems. Medicines may include:  Antiviral drugs.  Anti-inflammatory drugs.  Pain medicines.  If the area involved is on your face, you may be referred to a specialist, such as an eye doctor (ophthalmologist) or an ear, nose, and throat (ENT) doctor to help you avoid eye problems, chronic pain, or disability. Follow these instructions at home: Medicines  Take medicines only as directed by your health care provider.  Apply an anti-itch or numbing cream to the affected area as directed by your health care provider. Blister and Rash Care  Take a cool bath or apply cool compresses to the area of the rash or blisters as directed by your health care provider. This may help with pain and itching.  Keep your rash covered with a loose bandage (dressing). Wear loose-fitting clothing to help ease the pain of material rubbing against the rash.  Keep your rash and blisters clean with mild soap and cool water or as directed by your health care provider.  Check your rash every day for signs of infection. These include redness, swelling, and pain that lasts or increases.  Do not pick your blisters.  Do not scratch your rash. General instructions  Rest as directed by your health care provider.  Keep all follow-up visits as directed by your health care provider. This is important.  Until your blisters scab over, your infection can cause chickenpox in people who have never had it or been vaccinated   against it. To prevent this from happening, avoid contact with other people, especially: ? Babies. ? Pregnant women. ? Children who have eczema. ? Elderly people who have transplants. ? People who have chronic illnesses, such as leukemia or AIDS. Contact a health care provider if:  Your pain is not relieved with prescribed medicines.  Your pain does not get better after  the rash heals.  Your rash looks infected. Signs of infection include redness, swelling, and pain that lasts or increases. Get help right away if:  The rash is on your face or nose.  You have facial pain, pain around your eye area, or loss of feeling on one side of your face.  You have ear pain or you have ringing in your ear.  You have loss of taste.  Your condition gets worse. This information is not intended to replace advice given to you by your health care provider. Make sure you discuss any questions you have with your health care provider. Document Released: 08/13/2005 Document Revised: 04/08/2016 Document Reviewed: 06/24/2014 Elsevier Interactive Patient Education  2017 Elsevier Inc.  

## 2017-08-01 NOTE — Progress Notes (Signed)
BP (!) 134/92 (BP Location: Left Arm, Patient Position: Sitting, Cuff Size: Normal)   Pulse 89   Temp 97.9 F (36.6 C)   Wt 174 lb (78.9 kg)   SpO2 98%   BMI 28.69 kg/m    Subjective:    Patient ID: Tracey Weaver, female    DOB: 06-Jul-1966, 51 y.o.   MRN: [id number]  HPI: Tracey Weaver is a 51 y.o. female  Chief Complaint  Patient presents with  . skin lesion    on buttucks, since Monday   RASH Duration:  Monday  Location: R buttock  Itching: yes Burning: yes Redness: yes Oozing: no Scaling: no Blisters: yes Painful: yes Fevers: no Change in detergents/soaps/personal care products: no Recent illness: no Recent travel:no History of same: no Context: worse Alleviating factors: nothing Treatments attempted:nothing Shortness of breath: no  Throat/tongue swelling: no Myalgias/arthralgias: no  BREAST PAIN Duration :months Location: left axilla Onset: gradual Severity: moderate Quality: aching Frequency: constant Redness: no Swelling: no Trauma: no trauma Breastfeeding: no Associated with menstral cycle: no Nipple discharge: no Breast lump: no Status: stable Treatments attempted: changing bra Previous mammogram: yes   Relevant past medical, surgical, family and social history reviewed and updated as indicated. Interim medical history since our last visit reviewed. Allergies and medications reviewed and updated.  Review of Systems  Constitutional: Negative.   Respiratory: Negative.   Cardiovascular: Negative.   Skin: Positive for rash. Negative for color change, pallor and wound.  Psychiatric/Behavioral: Negative.     Per HPI unless specifically indicated above     Objective:    BP (!) 134/92 (BP Location: Left Arm, Patient Position: Sitting, Cuff Size: Normal)   Pulse 89   Temp 97.9 F (36.6 C)   Wt 174 lb (78.9 kg)   SpO2 98%   BMI 28.69 kg/m   Wt Readings from Last 3 Encounters:  08/01/17 174 lb (78.9 kg)  05/24/17 175 lb 3 oz (79.5  kg)  04/08/17 176 lb 8 oz (80.1 kg)    Physical Exam  Constitutional: She is oriented to person, place, and time. She appears well-developed and well-nourished. No distress.  HENT:  Head: Normocephalic and atraumatic.  Right Ear: Hearing normal.  Left Ear: Hearing normal.  Nose: Nose normal.  Eyes: Conjunctivae and lids are normal. Right eye exhibits no discharge. Left eye exhibits no discharge. No scleral icterus.  Cardiovascular: Normal rate, regular rhythm, normal heart sounds and intact distal pulses. Exam reveals no gallop and no friction rub.  No murmur heard. Pulmonary/Chest: Effort normal and breath sounds normal. No respiratory distress. She has no wheezes. She has no rales. She exhibits no tenderness.  Musculoskeletal: Normal range of motion.  Sore spot with no palpable mass in L axilla about 3 o'clock from the nipple  Neurological: She is alert and oriented to person, place, and time.  Skin: Skin is warm, dry and intact. Rash noted. She is not diaphoretic. No erythema. No pallor.  Vesicular rash on R buttock consistent with shingles  Psychiatric: She has a normal mood and affect. Her speech is normal and behavior is normal. Judgment and thought content normal. Cognition and memory are normal.    Results for orders placed or performed in visit on 05/24/17  Microscopic Examination  Result Value Ref Range   WBC, UA 0-5 0 - 5 /hpf   RBC, UA 0-2 0 - 2 /hpf   Epithelial Cells (non renal) 0-10 0 - 10 /hpf   Bacteria, UA Few None  seen/Few  UA/M w/rflx Culture, Routine  Result Value Ref Range   Specific Gravity, UA 1.020 1.005 - 1.030   pH, UA 6.5 5.0 - 7.5   Color, UA Yellow Yellow   Appearance Ur Turbid (A) Clear   Leukocytes, UA Negative Negative   Protein, UA Negative Negative/Trace   Glucose, UA Negative Negative   Ketones, UA Negative Negative   RBC, UA Negative Negative   Bilirubin, UA Negative Negative   Urobilinogen, Ur 0.2 0.2 - 1.0 mg/dL   Nitrite, UA Negative  Negative   Microscopic Examination See below:       Assessment & Plan:   Problem List Items Addressed This Visit    None    Visit Diagnoses    Herpes zoster without complication    -  Primary   Will treat with valacyclovir. Call with any concerns or if not getting better.    Relevant Medications   valACYclovir (VALTREX) 1000 MG tablet   Breast pain, left       Will obtain diagnostic mammogram and [place]. Await results.    Relevant Orders   MM Digital Diagnostic Unilat L   US BREAST COMPLETE UNI LEFT INC AXILLA       Follow up plan: Return if symptoms worsen or fail to improve.

## 2017-08-02 ENCOUNTER — Telehealth: Payer: Self-pay | Admitting: Cardiovascular Disease

## 2017-08-02 NOTE — Telephone Encounter (Signed)
No answer. Left detailed message, ok per DPR, that she may go to the [facility] at her earliest convenience for repeat lab work and that she will need to be fasting. Orders were entered previously when initially ordered and are still active. Advised patient to call back if she has any further questions.

## 2017-08-02 NOTE — Telephone Encounter (Signed)
Pt states last time she was here, Dr Rockey Situ suggested that we may need to recheck her cholesterol  She would like to know if this is still needed.  Please advise

## 2017-08-05 ENCOUNTER — Ambulatory Visit: Payer: BLUE CROSS/BLUE SHIELD | Admitting: Cardiovascular Disease

## 2017-08-15 ENCOUNTER — Telehealth: Payer: Self-pay | Admitting: Family Medicine

## 2017-08-15 ENCOUNTER — Other Ambulatory Visit: Payer: Self-pay | Admitting: *Deleted

## 2017-08-15 MED ORDER — ESTROGENS CONJUGATED 0.625 MG PO TABS: 0.6250 mg | ORAL_TABLET | Freq: Every day | ORAL | 6 refills | Status: DC

## 2017-08-15 NOTE — Telephone Encounter (Signed)
Copied from [place] [id number]. Topic: Quick Communication - See Telephone Encounter >> Aug 15, 2017  9:38 AM Boyd Kerbs wrote: CRM for notification. See Telephone encounter for:  Premarin prescription request pleased send to  [facility], [place] [address] Phone: [phone] Fax: [phone]   08/15/17.

## 2017-09-03 NOTE — Telephone Encounter (Signed)
I think she's talking about her mammogram, but I honestly am not sure

## 2017-09-03 NOTE — Telephone Encounter (Signed)
Called and left a message letting the patient know that she can call and schedule her mammogram at [facility].

## 2017-09-03 NOTE — Telephone Encounter (Signed)
Copied from [place]. Topic: Inquiry >> Sep 03, 2017 10:52 AM Ahmed Prima L wrote: Patient said she never received a call about setting up an appt for the issue she is having under her arm pit. Call back is [phone]  >> Sep 03, 2017 11:04 AM Reel, Rexene Edison, CMA wrote: Dr.Johnson, do you know what the patient is talking about?

## 2017-09-03 NOTE — Telephone Encounter (Signed)
[place] will send another order to be signed, patient will call and schedule

## 2017-09-09 DIAGNOSIS — M9902 Segmental and somatic dysfunction of thoracic region: Secondary | ICD-10-CM | POA: Diagnosis not present

## 2017-09-09 DIAGNOSIS — M50122 Cervical disc disorder at C5-C6 level with radiculopathy: Secondary | ICD-10-CM | POA: Diagnosis not present

## 2017-09-09 DIAGNOSIS — M546 Pain in thoracic spine: Secondary | ICD-10-CM | POA: Diagnosis not present

## 2017-09-09 DIAGNOSIS — M9901 Segmental and somatic dysfunction of cervical region: Secondary | ICD-10-CM | POA: Diagnosis not present

## 2017-09-10 DIAGNOSIS — M9902 Segmental and somatic dysfunction of thoracic region: Secondary | ICD-10-CM | POA: Diagnosis not present

## 2017-09-10 DIAGNOSIS — M50122 Cervical disc disorder at C5-C6 level with radiculopathy: Secondary | ICD-10-CM | POA: Diagnosis not present

## 2017-09-10 DIAGNOSIS — M546 Pain in thoracic spine: Secondary | ICD-10-CM | POA: Diagnosis not present

## 2017-09-10 DIAGNOSIS — M9901 Segmental and somatic dysfunction of cervical region: Secondary | ICD-10-CM | POA: Diagnosis not present

## 2017-09-11 DIAGNOSIS — M9901 Segmental and somatic dysfunction of cervical region: Secondary | ICD-10-CM | POA: Diagnosis not present

## 2017-09-11 DIAGNOSIS — M546 Pain in thoracic spine: Secondary | ICD-10-CM | POA: Diagnosis not present

## 2017-09-11 DIAGNOSIS — M9902 Segmental and somatic dysfunction of thoracic region: Secondary | ICD-10-CM | POA: Diagnosis not present

## 2017-09-11 DIAGNOSIS — M50122 Cervical disc disorder at C5-C6 level with radiculopathy: Secondary | ICD-10-CM | POA: Diagnosis not present

## 2017-09-16 ENCOUNTER — Ambulatory Visit
Admission: RE | Admit: 2017-09-16 | Discharge: 2017-09-16 | Disposition: A | Discharge status: Home / Self Care | Payer: BLUE CROSS/BLUE SHIELD | Source: Ambulatory Visit | Attending: Family Medicine | Admitting: Family Medicine

## 2017-09-16 ENCOUNTER — Encounter: Payer: Self-pay | Admitting: Family Medicine

## 2017-09-16 DIAGNOSIS — N644 Mastodynia: Secondary | ICD-10-CM | POA: Insufficient documentation

## 2017-09-16 DIAGNOSIS — N6489 Other specified disorders of breast: Secondary | ICD-10-CM | POA: Diagnosis not present

## 2017-09-16 DIAGNOSIS — M9902 Segmental and somatic dysfunction of thoracic region: Secondary | ICD-10-CM | POA: Diagnosis not present

## 2017-09-16 DIAGNOSIS — M546 Pain in thoracic spine: Secondary | ICD-10-CM | POA: Diagnosis not present

## 2017-09-16 DIAGNOSIS — M50122 Cervical disc disorder at C5-C6 level with radiculopathy: Secondary | ICD-10-CM | POA: Diagnosis not present

## 2017-09-16 DIAGNOSIS — M9901 Segmental and somatic dysfunction of cervical region: Secondary | ICD-10-CM | POA: Diagnosis not present

## 2017-09-16 DIAGNOSIS — R922 Inconclusive mammogram: Secondary | ICD-10-CM | POA: Diagnosis not present

## 2017-09-16 LAB — HM MAMMOGRAPHY

## 2017-09-18 DIAGNOSIS — M9901 Segmental and somatic dysfunction of cervical region: Secondary | ICD-10-CM | POA: Diagnosis not present

## 2017-09-18 DIAGNOSIS — M50122 Cervical disc disorder at C5-C6 level with radiculopathy: Secondary | ICD-10-CM | POA: Diagnosis not present

## 2017-09-18 DIAGNOSIS — M9902 Segmental and somatic dysfunction of thoracic region: Secondary | ICD-10-CM | POA: Diagnosis not present

## 2017-09-18 DIAGNOSIS — M546 Pain in thoracic spine: Secondary | ICD-10-CM | POA: Diagnosis not present

## 2017-09-20 DIAGNOSIS — M9902 Segmental and somatic dysfunction of thoracic region: Secondary | ICD-10-CM | POA: Diagnosis not present

## 2017-09-20 DIAGNOSIS — M546 Pain in thoracic spine: Secondary | ICD-10-CM | POA: Diagnosis not present

## 2017-09-20 DIAGNOSIS — M9901 Segmental and somatic dysfunction of cervical region: Secondary | ICD-10-CM | POA: Diagnosis not present

## 2017-09-20 DIAGNOSIS — M50122 Cervical disc disorder at C5-C6 level with radiculopathy: Secondary | ICD-10-CM | POA: Diagnosis not present

## 2017-09-25 DIAGNOSIS — M546 Pain in thoracic spine: Secondary | ICD-10-CM | POA: Diagnosis not present

## 2017-09-25 DIAGNOSIS — M50122 Cervical disc disorder at C5-C6 level with radiculopathy: Secondary | ICD-10-CM | POA: Diagnosis not present

## 2017-09-25 DIAGNOSIS — M9902 Segmental and somatic dysfunction of thoracic region: Secondary | ICD-10-CM | POA: Diagnosis not present

## 2017-09-25 DIAGNOSIS — M9901 Segmental and somatic dysfunction of cervical region: Secondary | ICD-10-CM | POA: Diagnosis not present

## 2017-09-27 DIAGNOSIS — M546 Pain in thoracic spine: Secondary | ICD-10-CM | POA: Diagnosis not present

## 2017-09-27 DIAGNOSIS — M9901 Segmental and somatic dysfunction of cervical region: Secondary | ICD-10-CM | POA: Diagnosis not present

## 2017-09-27 DIAGNOSIS — M9902 Segmental and somatic dysfunction of thoracic region: Secondary | ICD-10-CM | POA: Diagnosis not present

## 2017-09-27 DIAGNOSIS — M50122 Cervical disc disorder at C5-C6 level with radiculopathy: Secondary | ICD-10-CM | POA: Diagnosis not present

## 2017-10-02 DIAGNOSIS — M50122 Cervical disc disorder at C5-C6 level with radiculopathy: Secondary | ICD-10-CM | POA: Diagnosis not present

## 2017-10-02 DIAGNOSIS — M9902 Segmental and somatic dysfunction of thoracic region: Secondary | ICD-10-CM | POA: Diagnosis not present

## 2017-10-02 DIAGNOSIS — M9901 Segmental and somatic dysfunction of cervical region: Secondary | ICD-10-CM | POA: Diagnosis not present

## 2017-10-02 DIAGNOSIS — M546 Pain in thoracic spine: Secondary | ICD-10-CM | POA: Diagnosis not present

## 2017-10-14 DIAGNOSIS — M9902 Segmental and somatic dysfunction of thoracic region: Secondary | ICD-10-CM | POA: Diagnosis not present

## 2017-10-14 DIAGNOSIS — M546 Pain in thoracic spine: Secondary | ICD-10-CM | POA: Diagnosis not present

## 2017-10-14 DIAGNOSIS — M9901 Segmental and somatic dysfunction of cervical region: Secondary | ICD-10-CM | POA: Diagnosis not present

## 2017-10-14 DIAGNOSIS — M50122 Cervical disc disorder at C5-C6 level with radiculopathy: Secondary | ICD-10-CM | POA: Diagnosis not present

## 2017-10-18 DIAGNOSIS — M9901 Segmental and somatic dysfunction of cervical region: Secondary | ICD-10-CM | POA: Diagnosis not present

## 2017-10-18 DIAGNOSIS — M546 Pain in thoracic spine: Secondary | ICD-10-CM | POA: Diagnosis not present

## 2017-10-18 DIAGNOSIS — M50122 Cervical disc disorder at C5-C6 level with radiculopathy: Secondary | ICD-10-CM | POA: Diagnosis not present

## 2017-10-18 DIAGNOSIS — M9902 Segmental and somatic dysfunction of thoracic region: Secondary | ICD-10-CM | POA: Diagnosis not present

## 2017-10-21 DIAGNOSIS — Z809 Family history of malignant neoplasm, unspecified: Secondary | ICD-10-CM | POA: Diagnosis not present

## 2017-10-21 DIAGNOSIS — Z8559 Personal history of malignant neoplasm of other urinary tract organ: Secondary | ICD-10-CM | POA: Diagnosis not present

## 2017-10-21 DIAGNOSIS — R3915 Urgency of urination: Secondary | ICD-10-CM | POA: Diagnosis not present

## 2017-10-21 DIAGNOSIS — Z87448 Personal history of other diseases of urinary system: Secondary | ICD-10-CM | POA: Diagnosis not present

## 2017-10-21 DIAGNOSIS — Z87442 Personal history of urinary calculi: Secondary | ICD-10-CM | POA: Diagnosis not present

## 2017-10-21 DIAGNOSIS — Z79899 Other long term (current) drug therapy: Secondary | ICD-10-CM | POA: Diagnosis not present

## 2017-10-21 DIAGNOSIS — N3941 Urge incontinence: Secondary | ICD-10-CM | POA: Diagnosis not present

## 2017-10-21 DIAGNOSIS — N3010 Interstitial cystitis (chronic) without hematuria: Secondary | ICD-10-CM | POA: Diagnosis not present

## 2017-10-21 DIAGNOSIS — R102 Pelvic and perineal pain: Secondary | ICD-10-CM | POA: Diagnosis not present

## 2017-10-21 DIAGNOSIS — Z888 Allergy status to other drugs, medicaments and biological substances status: Secondary | ICD-10-CM | POA: Diagnosis not present

## 2017-10-21 DIAGNOSIS — Z87440 Personal history of urinary (tract) infections: Secondary | ICD-10-CM | POA: Diagnosis not present

## 2017-10-31 ENCOUNTER — Ambulatory Visit: Payer: Self-pay | Admitting: *Deleted

## 2017-10-31 NOTE — Telephone Encounter (Signed)
Pt called with complaints of productive cough that started on 10/26/17 and has had   Greenish-brown sputum; she states nasal congestion comes and goes, and today is the 1st day she has had a voice in 2 days; she also complains of episodes of getting hot; she has been taking alka seltzer cold and flu with no relief; she also has had achy joints for the past 2 days; nurse triage initiitated and recommendations made to see a physician within 24 hours; pt offered and accepted an appointment, per agent Toya 11/02/27 at 1115, pt also states that if there are any cancellations she can come into the office today; will route to office for notification; pt can be contacted at [phone].  Reason for Disposition . Fever present > 3 days (72 hours)  Answer Assessment - Initial Assessment Questions 1. ONSET: "When did the cough begin?"      10/26/17 2. SEVERITY: "How bad is the cough today?"      Moderate during the day; wakes her up at night 3. RESPIRATORY DISTRESS: "Describe your breathing."      Sometimes coughs when taking a deep breath in; ribs sore from coughing 4. FEVER: "Do you have a fever?" If so, ask: "What is your temperature, how was it measured, and when did it start?"     temp has been been around 99 5. SPUTUM: "Describe the color of your sputum" (clear, white, yellow, green)     Greenish-brown 6. HEMOPTYSIS: "Are you coughing up any blood?" If so ask: "How much?" (flecks, streaks, tablespoons, etc.)     no 7. CARDIAC HISTORY: "Do you have any history of heart disease?" (e.g., heart attack, congestive heart failure)      no 8. LUNG HISTORY: "Do you have any history of lung disease?"  (e.g., pulmonary embolus, asthma, emphysema)     History of pneumonia 10 years ago 34. PE RISK FACTORS: "Do you have a history of blood clots?" (or: recent major surgery, recent prolonged travel, bedridden )     no 10. OTHER SYMPTOMS: "Do you have any other symptoms?" (e.g., runny nose, wheezing, chest pain)  Sinus drainage down back of throat 11. PREGNANCY: "Is there any chance you are pregnant?" "When was your last menstrual period?"       No hysterectomy 12. TRAVEL: "Have you traveled out of the country in the last month?" (e.g., travel history, exposures)       no  Protocols used: [place]

## 2017-11-01 ENCOUNTER — Ambulatory Visit (INDEPENDENT_AMBULATORY_CARE_PROVIDER_SITE_OTHER): Payer: BLUE CROSS/BLUE SHIELD | Admitting: Family Medicine

## 2017-11-01 ENCOUNTER — Encounter: Payer: Self-pay | Admitting: Family Medicine

## 2017-11-01 VITALS — BP 142/94 | HR 74 | Temp 98.5°F | Wt 171.1 lb

## 2017-11-01 DIAGNOSIS — J209 Acute bronchitis, unspecified: Secondary | ICD-10-CM | POA: Diagnosis not present

## 2017-11-01 DIAGNOSIS — R6889 Other general symptoms and signs: Secondary | ICD-10-CM | POA: Diagnosis not present

## 2017-11-01 LAB — VERITOR FLU A/B WAIVED
Influenza A: NEGATIVE
Influenza B: NEGATIVE

## 2017-11-01 MED ORDER — HYDROCOD POLST-CPM POLST ER 10-8 MG/5ML PO SUER: 5.0000 mL | Freq: Every evening | ORAL | 0 refills | Status: DC | PRN

## 2017-11-01 MED ORDER — PREDNISONE 10 MG PO TABS: ORAL_TABLET | ORAL | 0 refills | Status: DC

## 2017-11-01 MED ORDER — AZITHROMYCIN 250 MG PO TABS: ORAL_TABLET | ORAL | 0 refills | Status: DC

## 2017-11-01 NOTE — Progress Notes (Signed)
BP (!) 142/94 (BP Location: Left Arm, Patient Position: Sitting, Cuff Size: Normal)   Pulse 74   Temp 98.5 F (36.9 C)   Wt 171 lb 2 oz (77.6 kg)   SpO2 100%   BMI 28.22 kg/m    Subjective:    Patient ID: Tracey Weaver, female    DOB: 14-Mar-1966, 52 y.o.   MRN: [id number]  HPI: Tracey Weaver is a 52 y.o. female  Chief Complaint  Patient presents with  . URI   UPPER RESPIRATORY TRACT INFECTION Duration: almost a week Worst symptom: cough Fever: no Cough: yes Shortness of breath: yes Wheezing: no Chest pain: yes, with cough Chest tightness: yes Chest congestion: yes Nasal congestion: yes Runny nose: yes Post nasal drip: yes Sneezing: no Sore throat: no Swollen glands: no Sinus pressure: yes Headache: no Face pain: no Toothache: no Ear pain: no  Ear pressure: yes "right Eyes red/itching:no Eye drainage/crusting: no  Vomiting: no Rash: no Fatigue: yes Sick contacts: yes Strep contacts: no  Context: worse Recurrent sinusitis: no Relief with OTC cold/cough medications: no  Treatments attempted: cold/sinus   Relevant past medical, surgical, family and social history reviewed and updated as indicated. Interim medical history since our last visit reviewed. Allergies and medications reviewed and updated.  Review of Systems  Constitutional: Positive for fatigue and fever. Negative for activity change, appetite change, chills, diaphoresis and unexpected weight change.  HENT: Positive for congestion, postnasal drip, rhinorrhea, sinus pressure and sore throat. Negative for dental problem, drooling, ear discharge, ear pain, facial swelling, hearing loss, mouth sores, nosebleeds, sinus pain, sneezing, tinnitus, trouble swallowing and voice change.   Eyes: Negative.   Respiratory: Positive for cough, chest tightness and wheezing. Negative for apnea, choking, shortness of breath and stridor.   Cardiovascular: Negative.   Gastrointestinal: Negative.     Psychiatric/Behavioral: Negative.     Per HPI unless specifically indicated above     Objective:    BP (!) 142/94 (BP Location: Left Arm, Patient Position: Sitting, Cuff Size: Normal)   Pulse 74   Temp 98.5 F (36.9 C)   Wt 171 lb 2 oz (77.6 kg)   SpO2 100%   BMI 28.22 kg/m   Wt Readings from Last 3 Encounters:  11/01/17 171 lb 2 oz (77.6 kg)  08/01/17 174 lb (78.9 kg)  05/24/17 175 lb 3 oz (79.5 kg)    Physical Exam  Constitutional: She is oriented to person, place, and time. She appears well-developed and well-nourished. No distress.  HENT:  Head: Normocephalic and atraumatic.  Right Ear: Hearing and external ear normal.  Left Ear: Hearing and external ear normal.  Nose: Nose normal.  Mouth/Throat: Oropharynx is clear and moist. No oropharyngeal exudate.  Eyes: Conjunctivae, EOM and lids are normal. Pupils are equal, round, and reactive to light. Right eye exhibits no discharge. Left eye exhibits no discharge. No scleral icterus.  Neck: Normal range of motion. Neck supple. No JVD present. No tracheal deviation present. No thyromegaly present.  Cardiovascular: Normal rate, regular rhythm, normal heart sounds and intact distal pulses. Exam reveals no gallop and no friction rub.  No murmur heard. Pulmonary/Chest: Effort normal. No stridor. No respiratory distress. She has wheezes. She has no rales. She exhibits no tenderness.  Musculoskeletal: Normal range of motion.  Lymphadenopathy:    She has no cervical adenopathy.  Neurological: She is alert and oriented to person, place, and time.  Skin: Skin is warm, dry and intact. No rash noted. She is not  diaphoretic. No erythema. No pallor.  Psychiatric: She has a normal mood and affect. Her speech is normal and behavior is normal. Judgment and thought content normal. Cognition and memory are normal.  Nursing note and vitals reviewed.   Results for orders placed or performed in visit on 09/16/17  HM MAMMOGRAPHY  Result Value  Ref Range   HM Mammogram 0-4 Bi-Rad 0-4 Bi-Rad, Self Reported Normal      Assessment & Plan:   Problem List Items Addressed This Visit    None    Visit Diagnoses    Acute bronchitis, unspecified organism    -  Primary   Will treat with prednisone, azithromycin and tussionex for comfort. Return in 2 weeks for lung recheck.    Flu-like symptoms       Negative flu.   Relevant Orders   Veritor Flu A/B Waived       Follow up plan: Return 2 weeks, for lung recheck.

## 2017-11-07 ENCOUNTER — Telehealth: Payer: Self-pay | Admitting: Family Medicine

## 2017-11-07 NOTE — Telephone Encounter (Signed)
Patient called to discuss symptoms needing a refill on azithromycin and prednisone, left VM to call the office back.

## 2017-11-07 NOTE — Telephone Encounter (Signed)
Copied from [place] [id number]. Topic: Quick Communication - See Telephone Encounter >> Nov 07, 2017 11:15 AM Ahmed Prima L wrote: CRM for notification. See Telephone encounter for:   11/07/17.  Patient said she finished the azithromycin (ZITHROMAX) 250 MG tablet & the predniSONE (DELTASONE) 10 MG tablet and still feels sick. She wants to know can she have another round of each? Please advise [id number] [place], [place] - [address]

## 2017-11-08 MED ORDER — AZITHROMYCIN 250 MG PO TABS: ORAL_TABLET | ORAL | 0 refills | Status: DC

## 2017-11-08 MED ORDER — PREDNISONE 10 MG PO TABS: ORAL_TABLET | ORAL | 0 refills | Status: DC

## 2017-11-08 NOTE — Telephone Encounter (Signed)
Patient called about her medication request, she says "I just feel crappy. It seems like once I stopped taking the medication, everything came back. I'm coughing at night, my throat is sore and I feel like if I get another round of antibiotics and prednisone, that will knock it out." I advised her of her upcoming appointment on 11/15/17 and that I would send this to the office for the provider to review and she will be contacted with her recommendation.

## 2017-11-08 NOTE — Telephone Encounter (Signed)
Called and left patient a VM (signed DPR) letting her know what Dr. Johnson said.  

## 2017-11-08 NOTE — Telephone Encounter (Signed)
Refills sent to her pharmacy- if she's not better next week I need to see her.

## 2017-11-08 NOTE — Addendum Note (Signed)
Addended by: Valerie Roys on: 11/08/2017 03:02 PM   Modules accepted: Orders

## 2017-11-15 ENCOUNTER — Encounter: Payer: Self-pay | Admitting: Family Medicine

## 2017-11-15 ENCOUNTER — Ambulatory Visit (INDEPENDENT_AMBULATORY_CARE_PROVIDER_SITE_OTHER): Payer: BLUE CROSS/BLUE SHIELD | Admitting: Family Medicine

## 2017-11-15 VITALS — BP 133/82 | HR 87 | Temp 98.2°F | Wt 174.3 lb

## 2017-11-15 DIAGNOSIS — J209 Acute bronchitis, unspecified: Secondary | ICD-10-CM

## 2017-11-15 NOTE — Progress Notes (Signed)
   BP 133/82 (BP Location: Left Arm, Patient Position: Sitting, Cuff Size: Normal)   Pulse 87   Temp 98.2 F (36.8 C)   Wt 174 lb 5 oz (79.1 kg)   SpO2 100%   BMI 28.74 kg/m    Subjective:    Patient ID: Tracey Weaver, female    DOB: [birth year], 52 y.o.   MRN: [id number]  HPI: Tracey Weaver is a 52 y.o. female  Chief Complaint  Patient presents with  . lung recheck   Brylynn is feeling much much better. She notes that she is still very tired, but her breathing is better. No problems with fevers or chills. Otherwise feeling good.   Relevant past medical, surgical, family and social history reviewed and updated as indicated. Interim medical history since our last visit reviewed. Allergies and medications reviewed and updated.  Review of Systems  Constitutional: Negative.   HENT: Negative.   Respiratory: Negative.   Cardiovascular: Negative.   Psychiatric/Behavioral: Negative.     Per HPI unless specifically indicated above     Objective:    BP 133/82 (BP Location: Left Arm, Patient Position: Sitting, Cuff Size: Normal)   Pulse 87   Temp 98.2 F (36.8 C)   Wt 174 lb 5 oz (79.1 kg)   SpO2 100%   BMI 28.74 kg/m   Wt Readings from Last 3 Encounters:  11/15/17 174 lb 5 oz (79.1 kg)  11/01/17 171 lb 2 oz (77.6 kg)  08/01/17 174 lb (78.9 kg)    Physical Exam  Constitutional: She is oriented to person, place, and time. She appears well-developed and well-nourished. No distress.  HENT:  Head: Normocephalic and atraumatic.  Right Ear: Hearing and external ear normal.  Left Ear: Hearing and external ear normal.  Nose: Nose normal.  Mouth/Throat: Oropharynx is clear and moist. No oropharyngeal exudate.  Eyes: Pupils are equal, round, and reactive to light. Conjunctivae, EOM and lids are normal. Right eye exhibits no discharge. Left eye exhibits no discharge. No scleral icterus.  Neck: Normal range of motion. No JVD present. No tracheal deviation present. No thyromegaly  present.  Cardiovascular: Normal rate, regular rhythm, normal heart sounds and intact distal pulses. Exam reveals no gallop and no friction rub.  No murmur heard. Pulmonary/Chest: Effort normal and breath sounds normal. No stridor. No respiratory distress. She has no wheezes. She has no rales. She exhibits no tenderness.  Musculoskeletal: Normal range of motion.  Lymphadenopathy:    She has no cervical adenopathy.  Neurological: She is alert and oriented to person, place, and time.  Skin: Skin is warm, dry and intact. No rash noted. She is not diaphoretic. No erythema. No pallor.  Psychiatric: She has a normal mood and affect. Her speech is normal and behavior is normal. Judgment and thought content normal. Cognition and memory are normal.  Nursing note and vitals reviewed.   Results for orders placed or performed in visit on 11/01/17  Veritor Flu A/B Waived  Result Value Ref Range   Influenza A Negative Negative   Influenza B Negative Negative      Assessment & Plan:   Problem List Items Addressed This Visit    None    Visit Diagnoses    Acute bronchitis, unspecified organism    -  Primary   Resolved. Continue to monitor. Call with any concerns.        Follow up plan: Return if symptoms worsen or fail to improve.

## 2017-12-01 IMAGING — US US SOFT TISSUE HEAD/NECK
1 series · 14 of 25 positions shown · non-contrast
Comparison: None.

CLINICAL DATA: Palpable abnormality involving the right lateral
neck.

EXAM:
ULTRASOUND OF HEAD/NECK SOFT TISSUES
TECHNIQUE: Ultrasound examination of the head and neck soft tissues was
performed in the area of clinical concern.

[Series 1: us soft tissue head/neck · 0.05mm/px · 14 of 27 slices shown]
[im 1/27]
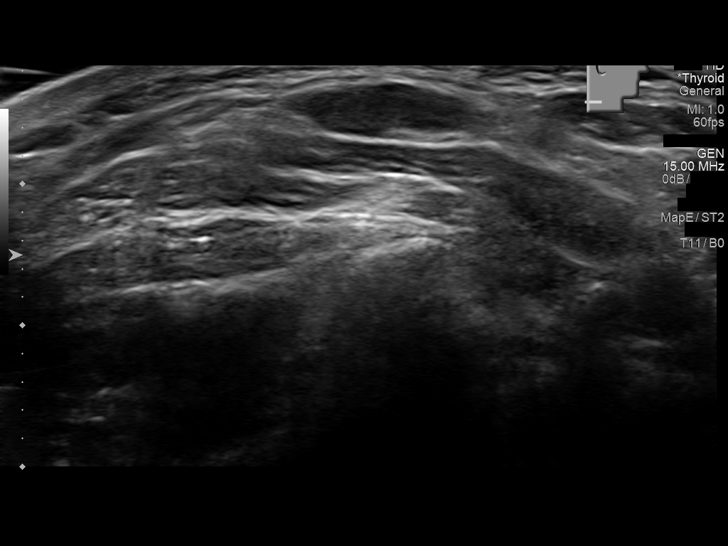
[im 3/27]
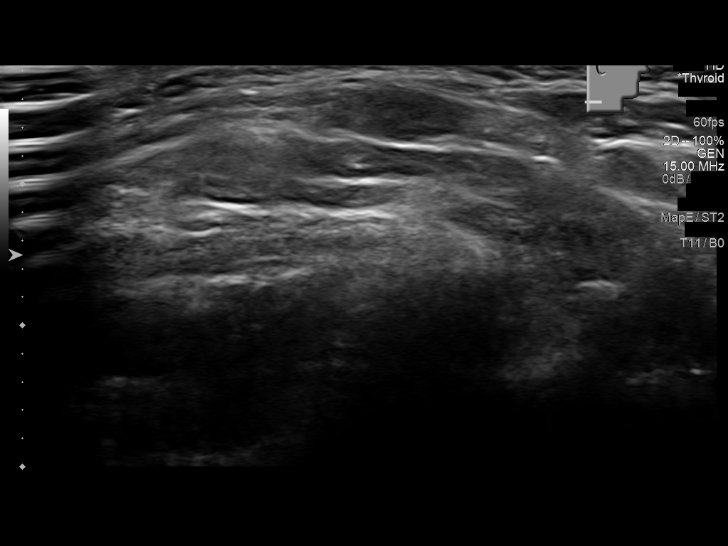
[im 5/27]
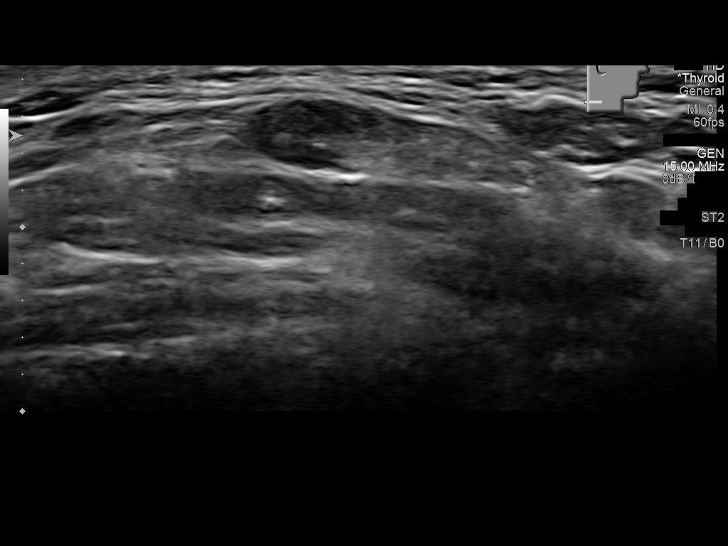
[im 7/27]
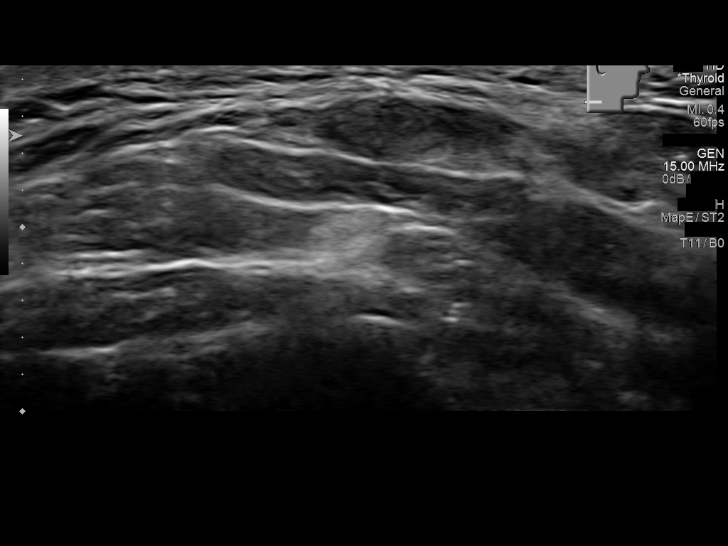
[im 9/27]
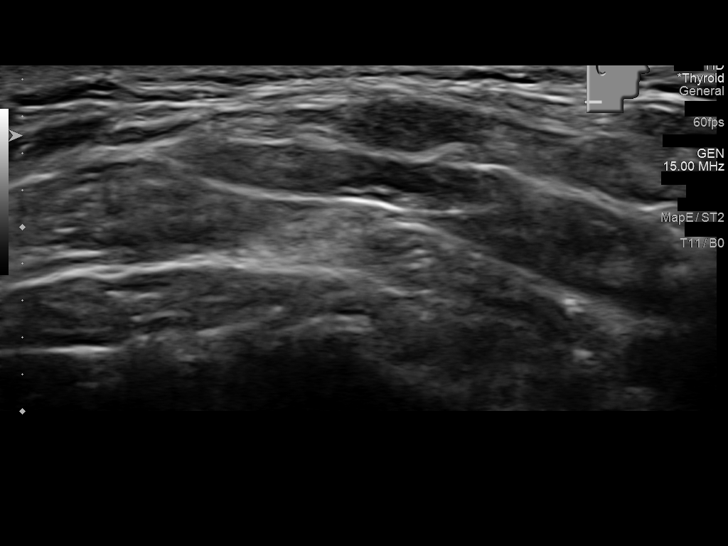
[im 10/27]
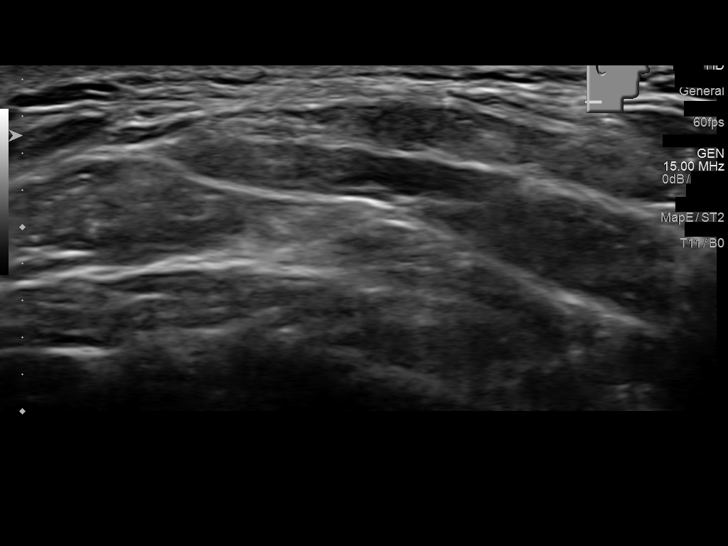
[im 12/27]
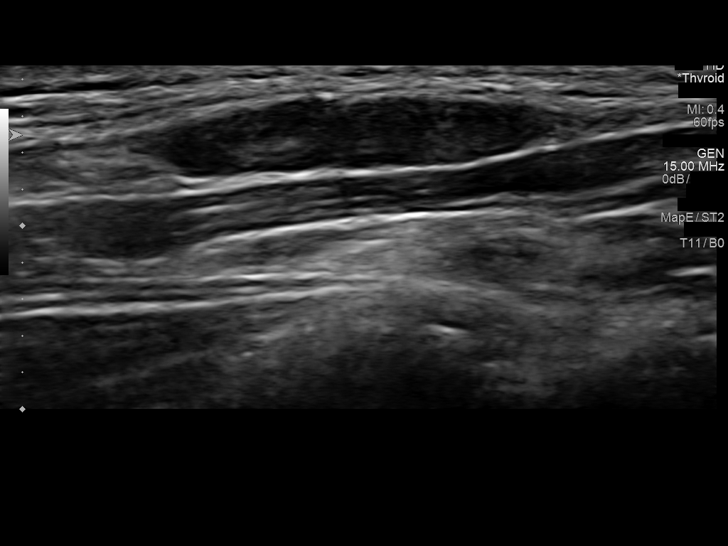
[im 15/27]
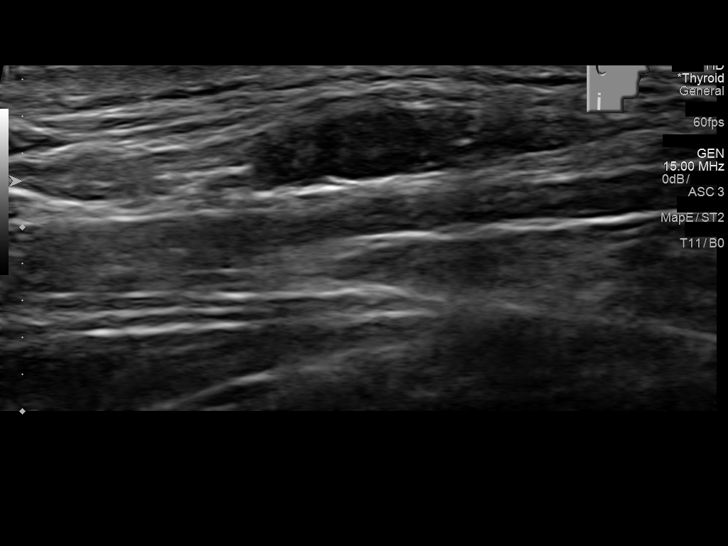
[im 17/27]
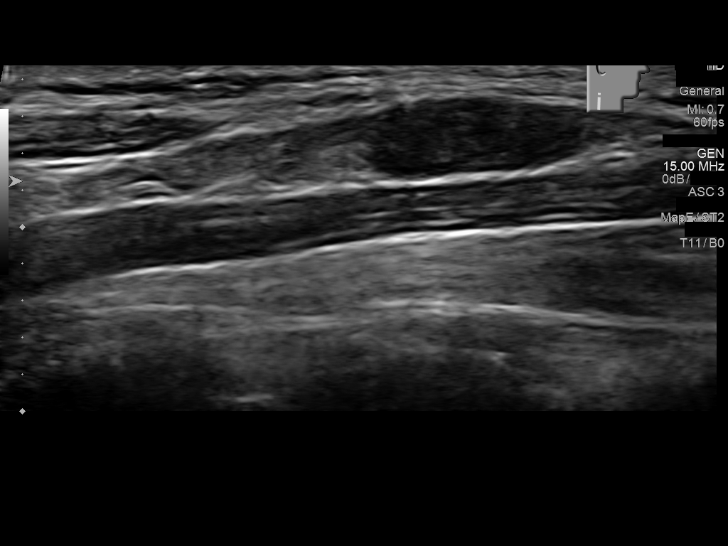
[im 18/27]
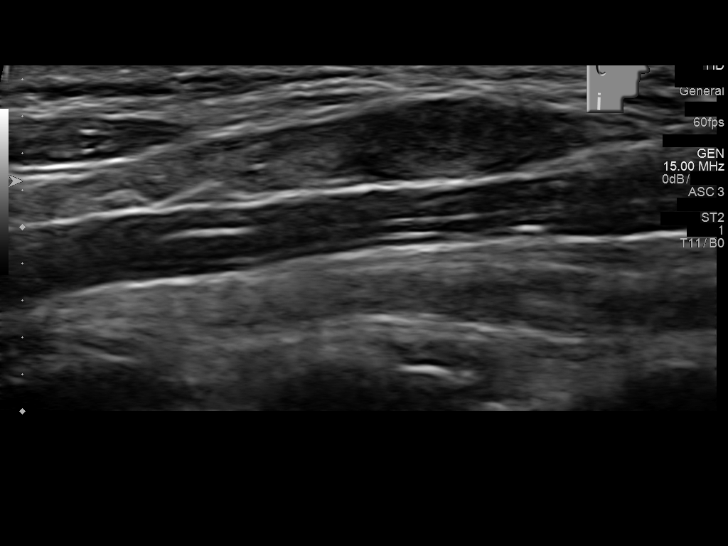
[im 20/27]
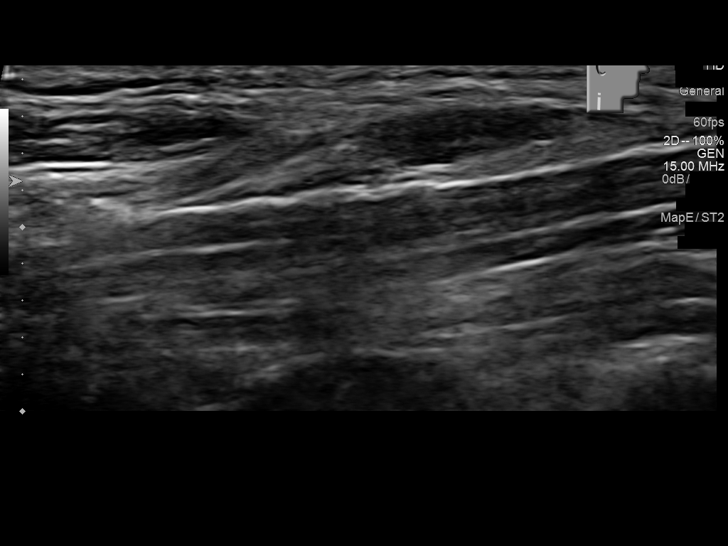
[im 22/27]
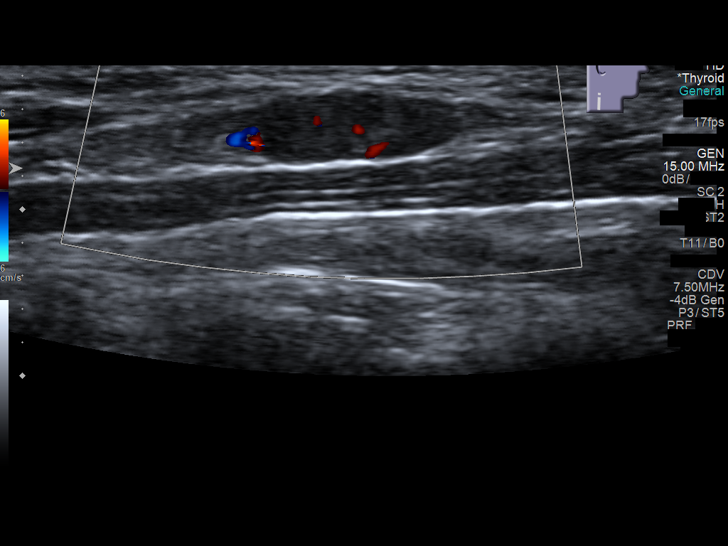
[im 24/27]
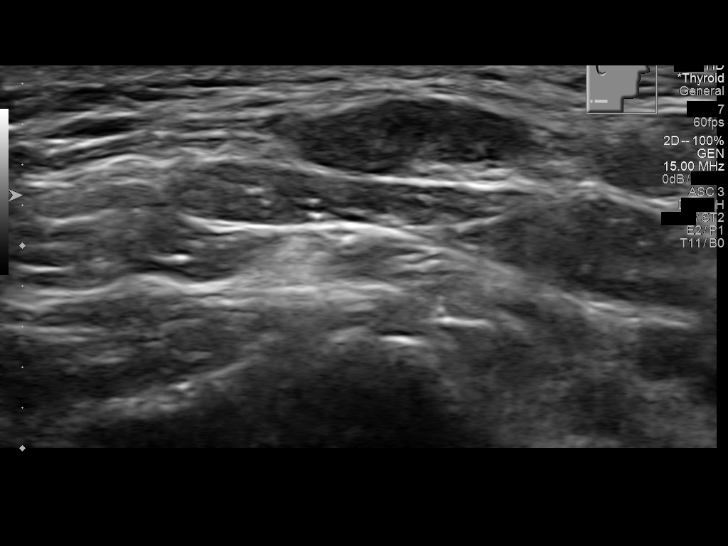
[im 27/27]
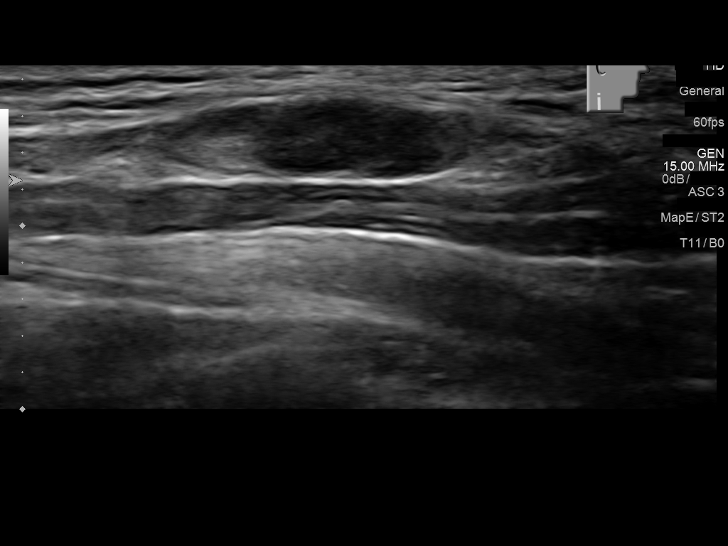

[14 of 25 positions shown; findings below may reference images not displayed]

FINDINGS: The patient's palpable area of concern within the right lateral neck
correlates with a non enlarged subcutaneous lymph node.

The lymph node measures approximately 0.4 cm in greatest short axis
diameter and maintains a benign fatty hilum (image 29).

No additional solid or cystic lesions correlate with the patient's
palpable area of concern.
IMPRESSION: Solitary benign-appearing lymph node correlates with the patient's
palpable area of concern.

## 2017-12-01 IMAGING — US US BIOPSY
1 series · 13 of 14 positions shown · non-contrast
Comparison: Soft tissue neck ultrasound - earlier same day;

INDICATION: Waxing and waning right posterior lateral cervical lymph node.
Please perform ultrasound-guided fine-needle aspiration for tissue
diagnostic purposes.

EXAM:
ULTRASOUND BIOPSY CORE LIVER
TECHNIQUE: Informed written consent was obtained from the patient after a
discussion of the risks, benefits and alternatives to treatment.
Questions regarding the procedure were encouraged and answered.
Initial ultrasound scanning demonstrated unchanged size and
appearance of the known approximately 0.5 cm lymph node within in
the posterior lateral aspect of the right knee neck. An ultrasound
image was saved for documentation purposes. The procedure was
planned. A timeout was performed prior to the initiation of the
procedure.

[Series 1: us biopsy · 0.04mm/px · 13 of 14 slices shown]
[im 1/14]
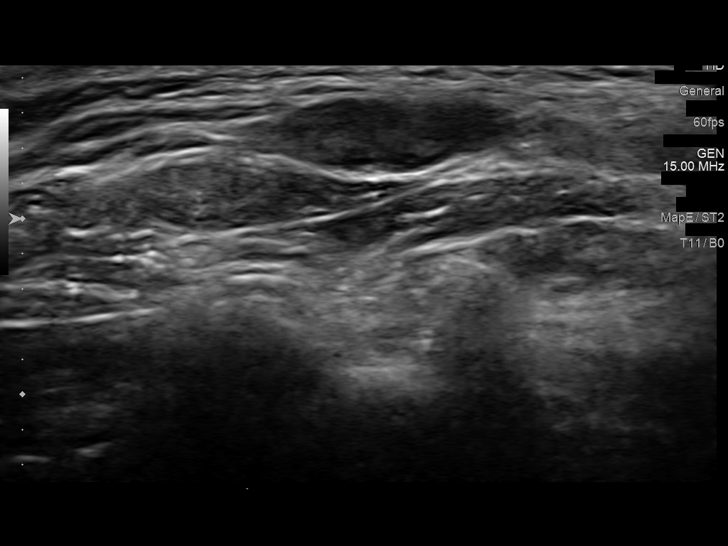
[im 2/14]
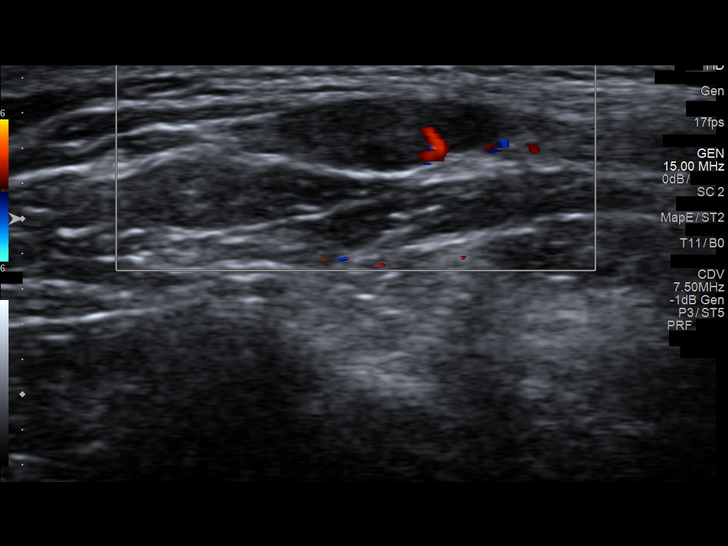
[im 3/14]
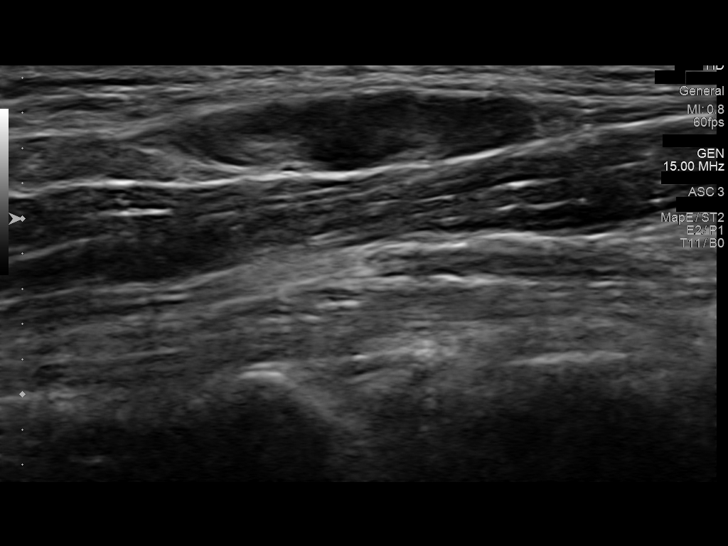
[im 4/14]
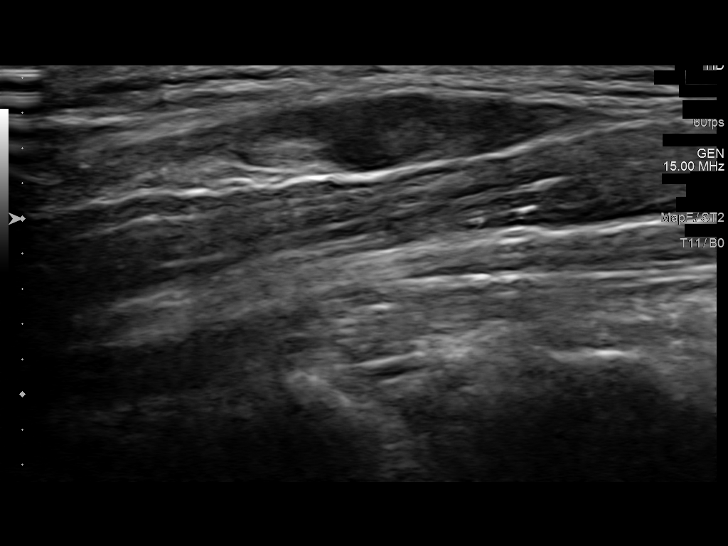
[im 5/14]
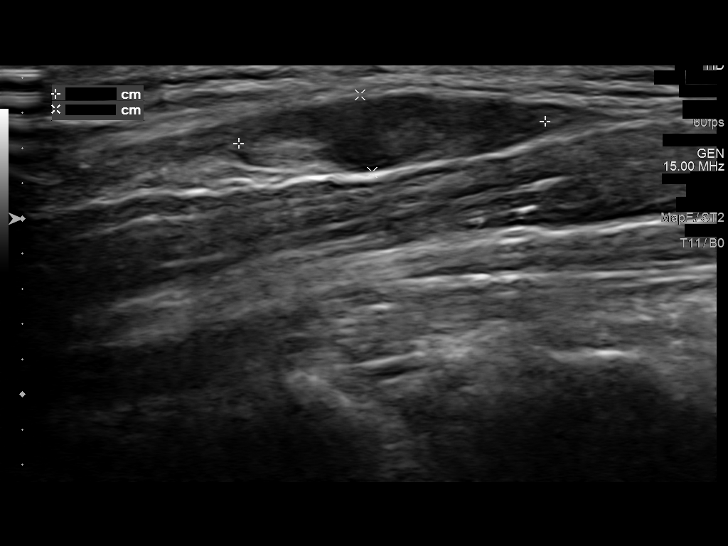
[im 6/14]
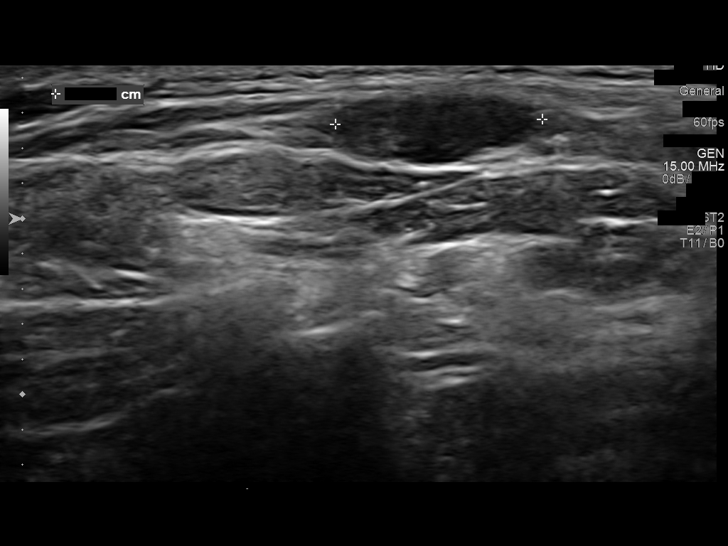
[im 8/14]
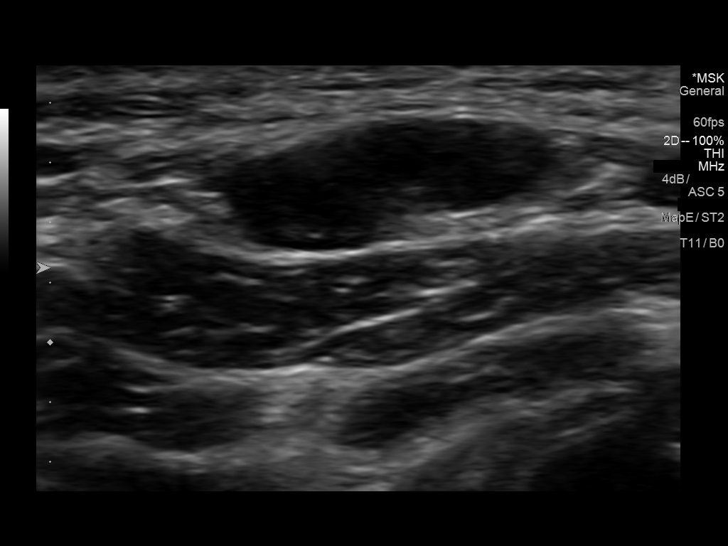
[im 9/14]
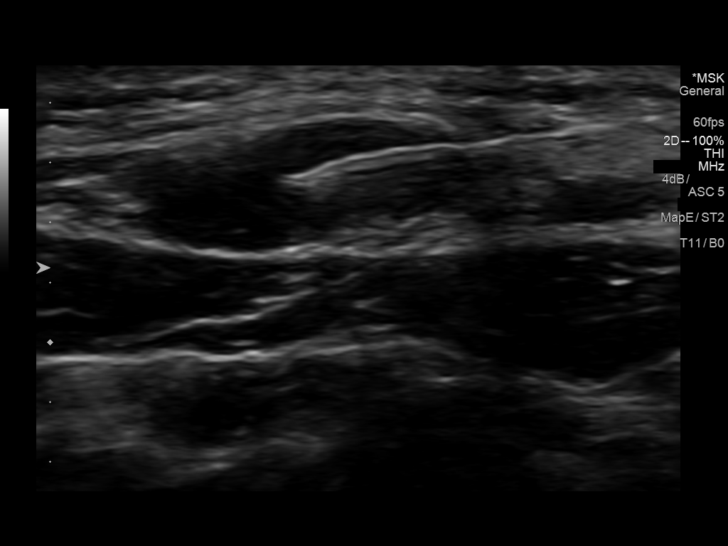
[im 10/14]
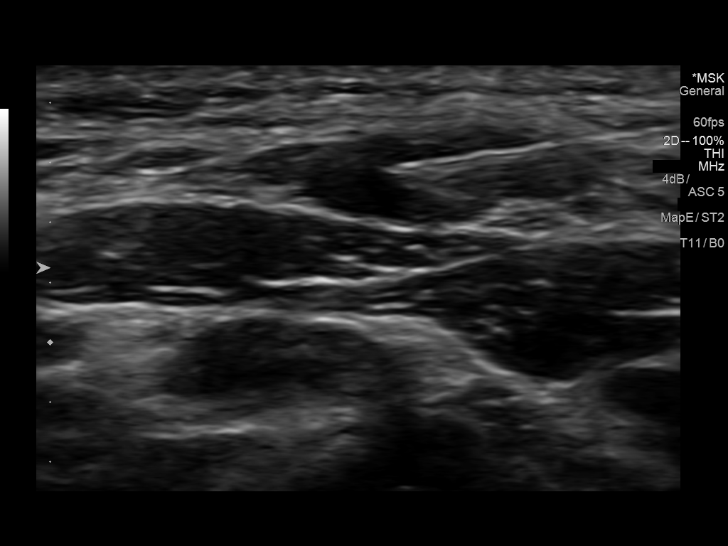
[im 11/14]
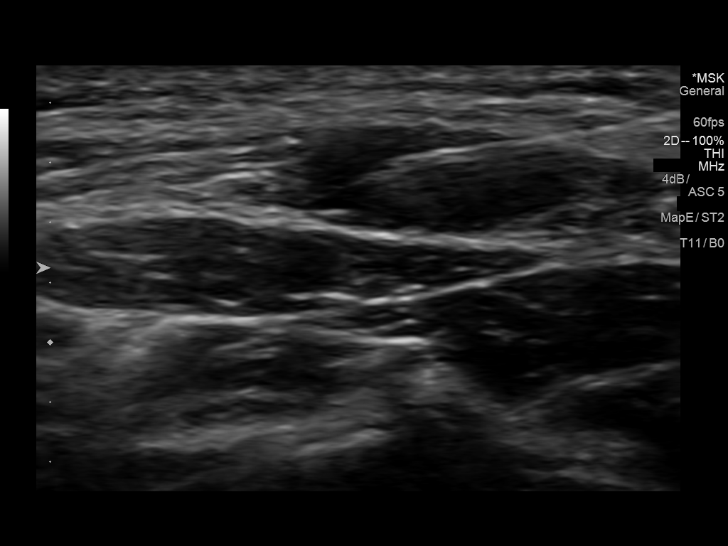
[im 12/14]
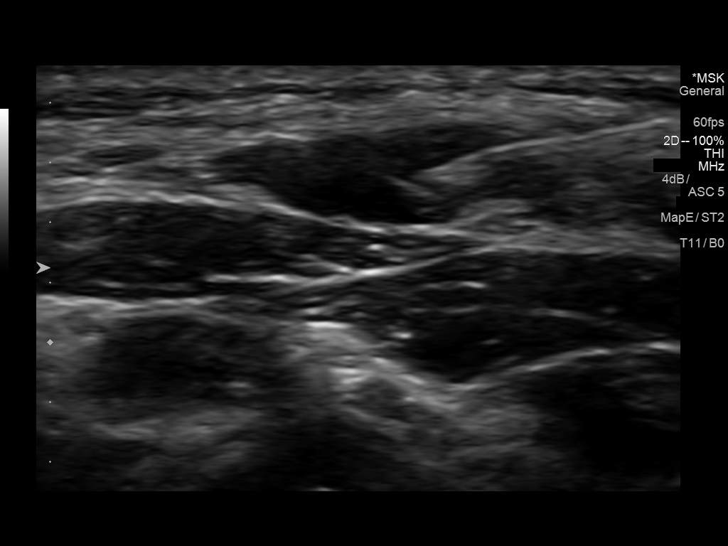
[im 13/14]
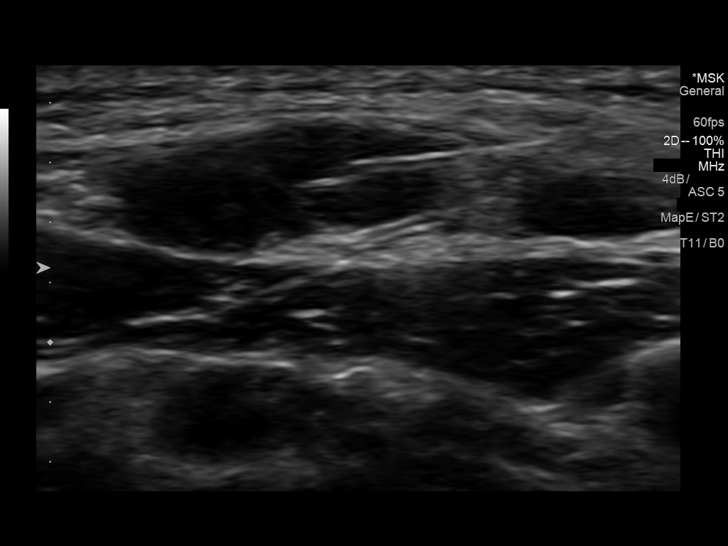
[im 14/14]
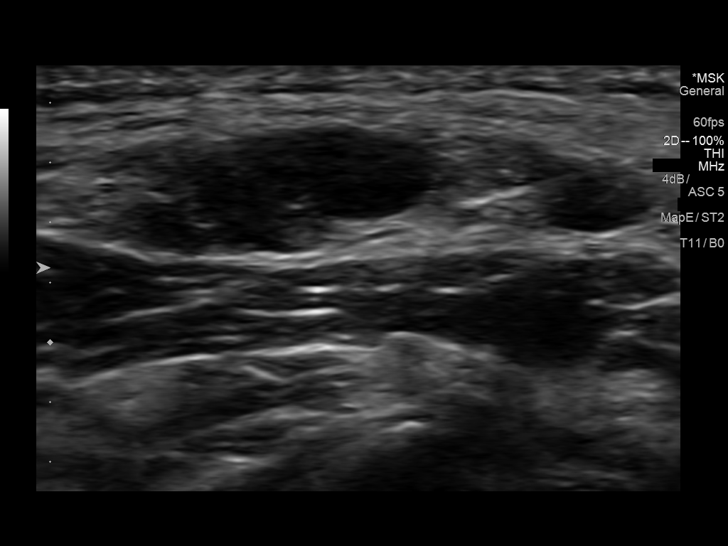

[13 of 14 positions shown; findings below may reference images not displayed]

contrast-enhanced neck CT - 07/11/2015

MEDICATIONS:
None

ANESTHESIA/SEDATION:
None

COMPLICATIONS:
None immediate.
The operative was prepped and draped in the usual sterile fashion,
and a sterile drape was applied covering the operative field. A
timeout was performed prior to the initiation of the procedure.
Local anesthesia was provided with 1% lidocaine with epinephrine.

Under direct ultrasound guidance, 5 fine needle aspirates of the
dominant right posterior lateral cervical lymph node were obtained
with a 27 gauge needle. Samples were prepared by the
cytotechnologists and submitted to pathology.

The samples were placed in saline and submitted to pathology. The
needle was removed and hemostasis was achieved with manual
compression. Post procedure scan was negative for significant
hematoma. A dressing was placed. The patient tolerated the procedure
well without immediate postprocedural complication.
IMPRESSION: Technically successful ultrasound guided dominant palpable right
posterior lateral cervical lymph node fine-needle aspiration.

## 2018-01-02 ENCOUNTER — Other Ambulatory Visit: Payer: Self-pay | Admitting: Family Medicine

## 2018-01-05 DIAGNOSIS — R51 Headache: Secondary | ICD-10-CM | POA: Diagnosis not present

## 2018-01-05 DIAGNOSIS — R05 Cough: Secondary | ICD-10-CM | POA: Diagnosis not present

## 2018-01-05 DIAGNOSIS — R0981 Nasal congestion: Secondary | ICD-10-CM | POA: Diagnosis not present

## 2018-01-05 DIAGNOSIS — J329 Chronic sinusitis, unspecified: Secondary | ICD-10-CM | POA: Diagnosis not present

## 2018-01-05 DIAGNOSIS — Z6827 Body mass index (BMI) 27.0-27.9, adult: Secondary | ICD-10-CM | POA: Diagnosis not present

## 2018-01-13 DIAGNOSIS — J209 Acute bronchitis, unspecified: Secondary | ICD-10-CM | POA: Diagnosis not present

## 2018-01-13 DIAGNOSIS — J019 Acute sinusitis, unspecified: Secondary | ICD-10-CM | POA: Diagnosis not present

## 2018-01-13 DIAGNOSIS — R05 Cough: Secondary | ICD-10-CM | POA: Diagnosis not present

## 2018-01-16 ENCOUNTER — Encounter: Payer: Self-pay | Admitting: Family Medicine

## 2018-02-12 ENCOUNTER — Encounter: Payer: Self-pay | Admitting: Family Medicine

## 2018-03-03 ENCOUNTER — Telehealth: Payer: Self-pay | Admitting: Family Medicine

## 2018-03-03 MED ORDER — SCOPOLAMINE 1 MG/3DAYS TD PT72: 1.0000 | MEDICATED_PATCH | TRANSDERMAL | 12 refills | Status: DC

## 2018-03-03 NOTE — Telephone Encounter (Signed)
Copied from [place] [id number]. Topic: General - Other >> Mar 03, 2018 12:21 PM Keene Breath wrote: Reason for CRM: Patient is going on a fishing trip and would like to request a nausea patch to be called into the pharmacy.  CB# [phone].

## 2018-03-10 ENCOUNTER — Encounter: Payer: BLUE CROSS/BLUE SHIELD | Admitting: Family Medicine

## 2018-03-13 ENCOUNTER — Encounter: Payer: Self-pay | Admitting: Family Medicine

## 2018-03-13 ENCOUNTER — Ambulatory Visit (INDEPENDENT_AMBULATORY_CARE_PROVIDER_SITE_OTHER): Payer: BLUE CROSS/BLUE SHIELD | Admitting: Family Medicine

## 2018-03-13 VITALS — BP 141/92 | HR 92 | Temp 98.2°F | Ht 64.6 in | Wt 173.2 lb

## 2018-03-13 DIAGNOSIS — Z23 Encounter for immunization: Secondary | ICD-10-CM | POA: Diagnosis not present

## 2018-03-13 DIAGNOSIS — I479 Paroxysmal tachycardia, unspecified: Secondary | ICD-10-CM

## 2018-03-13 DIAGNOSIS — R591 Generalized enlarged lymph nodes: Secondary | ICD-10-CM

## 2018-03-13 DIAGNOSIS — E782 Mixed hyperlipidemia: Secondary | ICD-10-CM | POA: Diagnosis not present

## 2018-03-13 DIAGNOSIS — Z0001 Encounter for general adult medical examination with abnormal findings: Secondary | ICD-10-CM | POA: Diagnosis not present

## 2018-03-13 DIAGNOSIS — Z0000 Encounter for general adult medical examination without abnormal findings: Secondary | ICD-10-CM

## 2018-03-13 DIAGNOSIS — C8581 Other specified types of non-Hodgkin lymphoma, lymph nodes of head, face, and neck: Secondary | ICD-10-CM

## 2018-03-13 DIAGNOSIS — Z1231 Encounter for screening mammogram for malignant neoplasm of breast: Secondary | ICD-10-CM

## 2018-03-13 DIAGNOSIS — G5622 Lesion of ulnar nerve, left upper limb: Secondary | ICD-10-CM | POA: Diagnosis not present

## 2018-03-13 DIAGNOSIS — M2550 Pain in unspecified joint: Secondary | ICD-10-CM

## 2018-03-13 LAB — UA/M W/RFLX CULTURE, ROUTINE
BILIRUBIN UA: NEGATIVE
Glucose, UA: NEGATIVE
Ketones, UA: NEGATIVE
Leukocytes, UA: NEGATIVE
NITRITE UA: NEGATIVE
PH UA: 5.5 (ref 5.0–7.5)
RBC UA: NEGATIVE
Urobilinogen, Ur: 0.2 mg/dL (ref 0.2–1.0)

## 2018-03-13 MED ORDER — DICLOFENAC SODIUM 75 MG PO TBEC: 75.0000 mg | DELAYED_RELEASE_TABLET | Freq: Two times a day (BID) | ORAL | 1 refills | Status: DC | PRN

## 2018-03-13 MED ORDER — ROSUVASTATIN CALCIUM 10 MG PO TABS: 10.0000 mg | ORAL_TABLET | Freq: Every day | ORAL | 1 refills | Status: DC

## 2018-03-13 MED ORDER — GABAPENTIN 100 MG PO CAPS: 100.0000 mg | ORAL_CAPSULE | Freq: Every day | ORAL | 1 refills | Status: DC

## 2018-03-13 MED ORDER — VALACYCLOVIR HCL 1 G PO TABS: 1000.0000 mg | ORAL_TABLET | Freq: Two times a day (BID) | ORAL | 0 refills | Status: DC

## 2018-03-13 MED ORDER — ESTROGENS CONJUGATED 0.625 MG PO TABS: 0.6250 mg | ORAL_TABLET | Freq: Every day | ORAL | 6 refills | Status: DC

## 2018-03-13 NOTE — Assessment & Plan Note (Signed)
Checking labs and [place]. Await results.

## 2018-03-13 NOTE — Patient Instructions (Addendum)
Tdap Vaccine (Tetanus, Diphtheria and Pertussis): What You Need to Know 1. Why get vaccinated? Tetanus, diphtheria and pertussis are very serious diseases. Tdap vaccine can protect [place] from these diseases. And, Tdap vaccine given to pregnant women can protect newborn babies against pertussis. TETANUS (Lockjaw) is rare in the [place] today. It causes painful muscle tightening and stiffness, usually all over the body.  It can lead to tightening of muscles in the head and neck so you can't open your mouth, swallow, or sometimes even breathe. Tetanus kills about 1 out of 10 people who are infected even after receiving the best medical care.  DIPHTHERIA is also rare in the [place] today. It can cause a thick coating to form in the back of the throat.  It can lead to breathing problems, heart failure, paralysis, and death.  PERTUSSIS (Whooping Cough) causes severe coughing spells, which can cause difficulty breathing, vomiting and disturbed sleep.  It can also lead to weight loss, incontinence, and rib fractures. Up to 2 in 100 adolescents and 5 in 100 adults with pertussis are hospitalized or have complications, which could include pneumonia or death.  These diseases are caused by bacteria. Diphtheria and pertussis are spread from person to person through secretions from coughing or sneezing. Tetanus enters the body through cuts, scratches, or wounds. Before vaccines, as many as 200,000 cases of diphtheria, 200,000 cases of pertussis, and hundreds of cases of tetanus, were reported in the [place] each year. Since vaccination began, reports of cases for tetanus and diphtheria have dropped by about 99% and for pertussis by about 80%. 2. Tdap vaccine Tdap vaccine can protect adolescents and adults from tetanus, diphtheria, and pertussis. One dose of Tdap is routinely given at age 8 or 17. People who did not get Tdap at that age should get it as soon as possible. Tdap is especially  important for healthcare professionals and anyone having close contact with a baby younger than 12 months. Pregnant women should get a dose of Tdap during every pregnancy, to protect the newborn from pertussis. Infants are most at risk for severe, life-threatening complications from pertussis. Another vaccine, called Td, protects against tetanus and diphtheria, but not pertussis. A Td booster should be given every 10 years. Tdap may be given as one of these boosters if you have never gotten Tdap before. Tdap may also be given after a severe cut or burn to prevent tetanus infection. Your doctor or the person giving you the vaccine can give you more information. Tdap may safely be given at the same time as other vaccines. 3. Some people should not get this vaccine  A person who has ever had a life-threatening allergic reaction after a previous dose of any diphtheria, tetanus or pertussis containing vaccine, OR has a severe allergy to any part of this vaccine, should not get Tdap vaccine. Tell the person giving the vaccine about any severe allergies.  Anyone who had coma or long repeated seizures within 7 days after a childhood dose of DTP or DTaP, or a previous dose of Tdap, should not get Tdap, unless a cause other than the vaccine was found. They can still get Td.  Talk to your doctor if you: ? have seizures or another nervous system problem, ? had severe pain or swelling after any vaccine containing diphtheria, tetanus or pertussis, ? ever had a condition called Guillain-Barr Syndrome (GBS), ? aren't feeling well on the day the shot is scheduled. 4. Risks With any medicine, including  vaccines, there is a chance of side effects. These are usually mild and go away on their own. Serious reactions are also possible but are rare. Most people who get Tdap vaccine do not have any problems with it. Mild problems following Tdap: (Did not interfere with activities)  Pain where the shot was given (about  3 in 4 adolescents or 2 in 3 adults)  Redness or swelling where the shot was given (about 1 person in 5)  Mild fever of at least 100.23F (up to about 1 in 25 adolescents or 1 in 100 adults)  Headache (about 3 or 4 people in 10)  Tiredness (about 1 person in 3 or 4)  Nausea, vomiting, diarrhea, stomach ache (up to 1 in 4 adolescents or 1 in 10 adults)  Chills, sore joints (about 1 person in 10)  Body aches (about 1 person in 3 or 4)  Rash, swollen glands (uncommon)  Moderate problems following Tdap: (Interfered with activities, but did not require medical attention)  Pain where the shot was given (up to 1 in 5 or 6)  Redness or swelling where the shot was given (up to about 1 in 16 adolescents or 1 in 12 adults)  Fever over 102F (about 1 in 100 adolescents or 1 in 250 adults)  Headache (about 1 in 7 adolescents or 1 in 10 adults)  Nausea, vomiting, diarrhea, stomach ache (up to 1 or 3 people in 100)  Swelling of the entire arm where the shot was given (up to about 1 in 500).  Severe problems following Tdap: (Unable to perform usual activities; required medical attention)  Swelling, severe pain, bleeding and redness in the arm where the shot was given (rare).  Problems that could happen after any vaccine:  People sometimes faint after a medical procedure, including vaccination. Sitting or lying down for about 15 minutes can help prevent fainting, and injuries caused by a fall. Tell your doctor if you feel dizzy, or have vision changes or ringing in the ears.  Some people get severe pain in the shoulder and have difficulty moving the arm where a shot was given. This happens very rarely.  Any medication can cause a severe allergic reaction. Such reactions from a vaccine are very rare, estimated at fewer than 1 in a million doses, and would happen within a few minutes to a few hours after the vaccination. As with any medicine, there is a very remote chance of a vaccine  causing a serious injury or death. The safety of vaccines is always being monitored. For more information, visit: http://www.aguilar.org/ 5. What if there is a serious problem? What should I look for? Look for anything that concerns you, such as signs of a severe allergic reaction, very high fever, or unusual behavior. Signs of a severe allergic reaction can include hives, swelling of the face and throat, difficulty breathing, a fast heartbeat, dizziness, and weakness. These would usually start a few minutes to a few hours after the vaccination. What should I do?  If you think it is a severe allergic reaction or other emergency that can't wait, call 9-1-1 or get the person to the nearest hospital. Otherwise, call your doctor.  Afterward, the reaction should be reported to the Vaccine Adverse Event Reporting System (VAERS). Your doctor might file this report, or you can do it yourself through the VAERS web site at www.vaers.SamedayNews.es, or by calling [phone]. ? VAERS does not give medical advice. 6. The National Vaccine Injury [employer]  Vaccine Injury Compensation Program (VICP) is a federal program that was created to compensate people who may have been injured by certain vaccines. Persons who believe they may have been injured by a vaccine can learn about the program and about filing a claim by calling 561-804-6615 or visiting the [place] website at GoldCloset.com.ee. There is a time limit to file a claim for compensation. 7. How can I learn more?  Ask your doctor. He or she can give you the vaccine package insert or suggest other sources of information.  Call your local or state health department.  Contact the Centers for Disease Control and Prevention (CDC): ? Call 424-328-3040 (1-800-CDC-INFO) or ? Visit CDC's website at http://hunter.com/ CDC Tdap Vaccine VIS (10/20/13) This information is not intended to replace advice given to you by your  health care provider. Make sure you discuss any questions you have with your health care provider. Document Released: 02/12/2012 Document Revised: 05/03/2016 Document Reviewed: 05/03/2016 Elsevier Interactive Patient Education  2017 [place] Maintenance for Postmenopausal Women Menopause is a normal process in which your reproductive ability comes to an end. This process happens gradually over a span of months to years, usually between the ages of 7 and 80. Menopause is complete when you have missed 12 consecutive menstrual periods. It is important to talk with your health care provider about some of the most common conditions that affect postmenopausal women, such as heart disease, cancer, and bone loss (osteoporosis). Adopting a healthy lifestyle and getting preventive care can help to promote your health and wellness. Those actions can also lower your chances of developing some of these common conditions. What should I know about menopause? During menopause, you may experience a number of symptoms, such as:  Moderate-to-severe hot flashes.  Night sweats.  Decrease in sex drive.  Mood swings.  Headaches.  Tiredness.  Irritability.  Memory problems.  Insomnia.  Choosing to treat or not to treat menopausal changes is an individual decision that you make with your health care provider. What should I know about hormone replacement therapy and supplements? Hormone therapy products are effective for treating symptoms that are associated with menopause, such as hot flashes and night sweats. Hormone replacement carries certain risks, especially as you become older. If you are thinking about using estrogen or estrogen with progestin treatments, discuss the benefits and risks with your health care provider. What should I know about heart disease and stroke? Heart disease, heart attack, and stroke become more likely as you age. This may be due, in part, to the hormonal changes  that your body experiences during menopause. These can affect how your body processes dietary fats, triglycerides, and cholesterol. Heart attack and stroke are both medical emergencies. There are many things that you can do to help prevent heart disease and stroke:  Have your blood pressure checked at least every 1-2 years. High blood pressure causes heart disease and increases the risk of stroke.  If you are 1-29 years old, ask your health care provider if you should take aspirin to prevent a heart attack or a stroke.  Do not use any tobacco products, including cigarettes, chewing tobacco, or electronic cigarettes. If you need help quitting, ask your health care provider.  It is important to eat a healthy diet and maintain a healthy weight. ? Be sure to include plenty of vegetables, fruits, low-fat dairy products, and lean protein. ? Avoid eating foods that are high in solid fats, added sugars, or salt (sodium).  Get  regular exercise. This is one of the most important things that you can do for your health. ? Try to exercise for at least 150 minutes each week. The type of exercise that you do should increase your heart rate and make you sweat. This is known as moderate-intensity exercise. ? Try to do strengthening exercises at least twice each week. Do these in addition to the moderate-intensity exercise.  Know your numbers.Ask your health care provider to check your cholesterol and your blood glucose. Continue to have your blood tested as directed by your health care provider.  What should I know about cancer screening? There are several types of cancer. Take the following steps to reduce your risk and to catch any cancer development as early as possible. Breast Cancer  Practice breast self-awareness. ? This means understanding how your breasts normally appear and feel. ? It also means doing regular breast self-exams. Let your health care provider know about any changes, no matter how  small.  If you are 75 or older, have a clinician do a breast exam (clinical breast exam or CBE) every year. Depending on your age, family history, and medical history, it may be recommended that you also have a yearly breast X-ray (mammogram).  If you have a family history of breast cancer, talk with your health care provider about genetic screening.  If you are at high risk for breast cancer, talk with your health care provider about having an MRI and a mammogram every year.  Breast cancer (BRCA) gene test is recommended for women who have family members with BRCA-related cancers. Results of the assessment will determine the need for genetic counseling and BRCA1 and for BRCA2 testing. BRCA-related cancers include these types: ? Breast. This occurs in males or females. ? Ovarian. ? Tubal. This may also be called fallopian tube cancer. ? Cancer of the abdominal or pelvic lining (peritoneal cancer). ? Prostate. ? Pancreatic.  Cervical, Uterine, and Ovarian Cancer Your health care provider may recommend that you be screened regularly for cancer of the pelvic organs. These include your ovaries, uterus, and vagina. This screening involves a pelvic exam, which includes checking for microscopic changes to the surface of your cervix (Pap test).  For women ages 21-65, health care providers may recommend a pelvic exam and a Pap test every three years. For women ages 66-65, they may recommend the Pap test and pelvic exam, combined with testing for human papilloma virus (HPV), every five years. Some types of HPV increase your risk of cervical cancer. Testing for HPV may also be done on women of any age who have unclear Pap test results.  Other health care providers may not recommend any screening for nonpregnant women who are considered low risk for pelvic cancer and have no symptoms. Ask your health care provider if a screening pelvic exam is right for you.  If you have had past treatment for cervical  cancer or a condition that could lead to cancer, you need Pap tests and screening for cancer for at least 20 years after your treatment. If Pap tests have been discontinued for you, your risk factors (such as having a new sexual partner) need to be reassessed to determine if you should start having screenings again. Some women have medical problems that increase the chance of getting cervical cancer. In these cases, your health care provider may recommend that you have screening and Pap tests more often.  If you have a family history of uterine cancer or ovarian cancer,  talk with your health care provider about genetic screening.  If you have vaginal bleeding after reaching menopause, tell your health care provider.  There are currently no reliable tests available to screen for ovarian cancer.  Lung Cancer Lung cancer screening is recommended for adults 24-47 years old who are at high risk for lung cancer because of a history of smoking. A yearly low-dose CT scan of the lungs is recommended if you:  Currently smoke.  Have a history of at least 30 pack-years of smoking and you currently smoke or have quit within the past 15 years. A pack-year is smoking an average of one pack of cigarettes per day for one year.  Yearly screening should:  Continue until it has been 15 years since you quit.  Stop if you develop a health problem that would prevent you from having lung cancer treatment.  Colorectal Cancer  This type of cancer can be detected and can often be prevented.  Routine colorectal cancer screening usually begins at age 66 and continues through age 34.  If you have risk factors for colon cancer, your health care provider may recommend that you be screened at an earlier age.  If you have a family history of colorectal cancer, talk with your health care provider about genetic screening.  Your health care provider may also recommend using home test kits to check for hidden blood in your  stool.  A small camera at the end of a tube can be used to examine your colon directly (sigmoidoscopy or colonoscopy). This is done to check for the earliest forms of colorectal cancer.  Direct examination of the colon should be repeated every 5-10 years until age 70. However, if early forms of precancerous polyps or small growths are found or if you have a family history or genetic risk for colorectal cancer, you may need to be screened more often.  Skin Cancer  Check your skin from head to toe regularly.  Monitor any moles. Be sure to tell your health care provider: ? About any new moles or changes in moles, especially if there is a change in a mole's shape or color. ? If you have a mole that is larger than the size of a pencil eraser.  If any of your family members has a history of skin cancer, especially at a young age, talk with your health care provider about genetic screening.  Always use sunscreen. Apply sunscreen liberally and repeatedly throughout the day.  Whenever you are outside, protect yourself by wearing long sleeves, pants, a wide-brimmed hat, and sunglasses.  What should I know about osteoporosis? Osteoporosis is a condition in which bone destruction happens more quickly than new bone creation. After menopause, you may be at an increased risk for osteoporosis. To help prevent osteoporosis or the bone fractures that can happen because of osteoporosis, the following is recommended:  If you are 45-64 years old, get at least 1,000 mg of calcium and at least 600 mg of vitamin D per day.  If you are older than age 44 but younger than age 46, get at least 1,200 mg of calcium and at least 600 mg of vitamin D per day.  If you are older than age 23, get at least 1,200 mg of calcium and at least 800 mg of vitamin D per day.  Smoking and excessive alcohol intake increase the risk of osteoporosis. Eat foods that are rich in calcium and vitamin D, and do weight-bearing exercises  several times each  week as directed by your health care provider. What should I know about how menopause affects my mental health? Depression may occur at any age, but it is more common as you become older. Common symptoms of depression include:  Low or sad mood.  Changes in sleep patterns.  Changes in appetite or eating patterns.  Feeling an overall lack of motivation or enjoyment of activities that you previously enjoyed.  Frequent crying spells.  Talk with your health care provider if you think that you are experiencing depression. What should I know about immunizations? It is important that you get and maintain your immunizations. These include:  Tetanus, diphtheria, and pertussis (Tdap) booster vaccine.  Influenza every year before the flu season begins.  Pneumonia vaccine.  Shingles vaccine.  Your health care provider may also recommend other immunizations. This information is not intended to replace advice given to you by your health care provider. Make sure you discuss any questions you have with your health care provider. Document Released: 10/05/2005 Document Revised: 03/02/2016 Document Reviewed: 05/17/2015 Elsevier Interactive Patient Education  2018 [place] Tunnel Syndrome Cubital tunnel syndrome is a condition that causes pain and weakness of the forearm and hand. This condition happens when one of the nerves (ulnar nerve) that runs alongside the elbow joint becomes irritated. What are the causes? Causes of this condition include:  Increased pressure on the ulnar nerve at the elbow, arm, or forearm. This can be caused by: ? Swollen tissues. ? Ligaments. ? Muscles. ? Poorly healed elbow fractures. ? Tumors in the elbow. These are usually noncancerous (benign). ? Scar tissue that develops in the elbow after an injury. ? Bony growths (spurs) near the ulnar nerve.  Stretching of the nerve due to loose elbow ligaments.  Trauma to the nerve at the  elbow.  Repetitive elbow bending.  Certain medical conditions.  What increases the risk? This condition is more likely to develop in:  People who do manual labor that requires frequently bending the elbow.  People who play sports that include repeated or strenuous throwing motions, such as baseball.  People who play contact sports, such as football or lacrosse.  People who do not warm up properly before activities.  People who have diabetes.  People who have an underactive thyroid (hypothyroidism).  What are the signs or symptoms? Symptoms of this condition include:  Clumsiness or weakness of the hand.  Tenderness of the inner elbow.  Aching or soreness of the inner elbow, forearm, or fingers, especially the little finger or the ring finger.  Increased pain with forced elbow bending.  Reduced control when throwing.  Tingling, numbness, or burning inside the forearm, or in part of the hand or fingers, especially the little finger or the ring finger.  Sharp pains that shoot from the elbow down to the wrist and hand.  The inability to grip or pinch hard.  How is this diagnosed? This condition is diagnosed with a medical history and physical exam. Your health care provider will ask about your symptoms and ask for details about any injury. You may also have other tests, including:  Electromyogram (EMG). This test checks how well the nerve is working.  X-ray.  How is this treated? Treatment starts by stopping the activities that are causing your symptoms to get worse. Treatment may include the use of icing and medicines to reduce pain and swelling. You may also be advised to wear a splint to prevent your elbow from bending or wear an elbow  pad where the ulnar nerve is closest to the skin. In less severe cases, treatment may also include working with a physical therapist:  To help decrease your symptoms.  To improve the strength and range of motion of your elbow, forearm,  and hand.  If the treatments described above do not help, surgery may be needed. Follow these instructions at home: If you have a splint:  Wear it as told by your health care provider. Remove it only as told by your health care provider.  Loosen the splint if your fingers become numb and tingle, or if they turn cold and blue.  Keep the splint clean and dry. Managing pain, stiffness, and swelling  If directed, apply ice to the injured area: ? Put ice in a plastic bag. ? Place a towel between your skin and the bag. ? Leave the ice on for 20 minutes, 2-3 times per day.  Move your fingers often to avoid stiffness and to lessen swelling.  Raise (elevate) the injured area above the level of your heart while you are sitting or lying down. General instructions  Take over-the-counter and prescription medicines only as told by your health care provider.  Keep all follow-up visits as told by your health care provider. This is important.  Do any exercise or physical therapy as told by your health care provider.  Do not drive or operate heavy machinery while taking prescription pain medicine.  If you were given an elbow pad, wear it as told by your health care provider. Contact a health care provider if:  Your symptoms get worse.  Your symptoms do not get better with treatment.  Your have new pain.  Your hand on the injured side feels numb or cold. This information is not intended to replace advice given to you by your health care provider. Make sure you discuss any questions you have with your health care provider. Document Released: 08/13/2005 Document Revised: 01/19/2016 Document Reviewed: 10/20/2014 Elsevier Interactive Patient Education  2018 [place] Ask your health care provider which exercises are safe for you. Do exercises exactly as told by your health care provider and adjust them as directed. It is normal to feel mild stretching, pulling,  tightness, or discomfort as you do these exercises, but you should stop right away if you feel sudden pain or your pain gets worse. Do not begin these exercises until told by your health care provider. Stretching and range of motion exercises These exercises warm up your muscles and joints and improve the movement and flexibility of your elbow. These exercises also help to relieve pain, numbness, and tingling. Exercise A: Wrist extensor stretch 1. Extend your left / right elbow with your fingers pointing down. 2. Gently pull the palm of your left / right hand toward you until you feel a gentle stretch on the top of your forearm. 3. To increase the stretch, push your left / right hand toward the outer edge or pinkie side of your forearm. 4. Hold this position for __________ seconds. Repeat __________ times. Complete this exercise __________ times a day. If directed by your health care provider, repeat this stretch except do it with a bent elbow this time. Exercise B: Wrist flexor stretch  1. Extend your left / right elbow and turn your palm upward. 2. Gently pull your left / right palm and fingertips back so your wrist extends and your fingers point more toward the ground. 3. You should feel a gentle stretch on  the inside of your forearm. 4. Hold this position for __________ seconds. Repeat __________ times. Complete this exercise __________ times a day. If directed by your health care provider, repeat this stretch except do it with a bent elbow this time. Strengthening exercises These exercises build strength and endurance in your elbow. Endurance is the ability to use your muscles for a long time, even after they get tired. Exercise C: Wrist extensors  1. Sit with your left / right forearm palm-down and fully supported on a table or countertop. Your elbow should be resting below the height of your shoulder. 2. Let your left / right wrist extend over the edge of the surface. 3. Loosely hold a  __________ weight or a piece of rubber exercise band or tubing in your left / right hand. Slowly curl your left / right hand up toward your forearm. If you are using band or tubing, hold the band or tubing in place with your other hand to provide resistance. 4. Hold this position for __________ seconds. 5. Slowly return to the starting position. Repeat __________ times. Complete this exercise __________ times a day. Exercise D: Radial deviators  1. Stand with a __________ weight in your left / righthand. Or, sit while holding a rubber exercise band or tubing with your other arm supported on a table or countertop. Position your hand so your thumb is on top. 2. Raise your hand upward in front of you so your thumb travels toward your forearm, or pull up on the rubber tubing. 3. Hold this position for __________ seconds. 4. Slowly return to the starting position. Repeat __________ times. Complete this exercise __________ times a day. Exercise E: Eccentric wrist extensors 1. Sit with your left / right forearm palm-down and fully supported on a table or countertop. Your elbow should be resting below the height of your shoulder. 2. If told by your health care provider, hold a __________ weight in your hand. 3. Let your left / right wrist extend over the edge of the surface. 4. Use your other hand to lift up your left / right hand toward your forearm. Keep your forearm on the table. 5. Using only the muscles in your left / right hand, slowly lower your hand back down to the starting position. Repeat __________ times. Complete this exercise __________ times a day. This information is not intended to replace advice given to you by your health care provider. Make sure you discuss any questions you have with your health care provider. Document Released: 08/13/2005 Document Revised: 04/18/2016 Document Reviewed: 05/12/2015 Elsevier Interactive Patient Education  [employer].

## 2018-03-13 NOTE — Progress Notes (Signed)
BP (!) 151/104 (BP Location: Left Arm, Patient Position: Sitting, Cuff Size: Normal)   Pulse 93   Temp 98.2 F (36.8 C)   Ht 5' 4.6" (1.641 m)   Wt 173 lb 3 oz (78.6 kg)   SpO2 99%   BMI 29.18 kg/m    Subjective:    Patient ID: Tracey Weaver, female    DOB: Feb 27, 1966, 52 y.o.   MRN: [id number]  HPI: BERANIA Weaver is a 52 y.o. female presenting on 03/13/2018 for comprehensive medical examination. Current medical complaints include: Having pain and numbness in her L last 2 digits- worse with hair dressing. Nothing makes it better.   Having some swollen glands on the L side of her neck- would like an ultrasound  She currently lives with: Husband Menopausal Symptoms: no  Depression Screen done today and results listed below:  Depression screen [facility] 2/9 03/13/2018 11/13/2016 07/30/2016 07/16/2016 02/13/2015  Decreased Interest 0 0 0 0 0  Down, Depressed, Hopeless 0 0 0 0 0  PHQ - 2 Score 0 0 0 0 0    Past Medical History:  Past Medical History:  Diagnosis Date  . Adjustment disorder with depressed mood   . Anxiety   . Cervical cancer (HCC)    precancerous cells  . Chest pain   . Constipation   . Degenerative disc disease, cervical   . Dysplastic nevus 08/27/2013   s/p resection by Dr. Aubery Lapping.  . Dysthymic disorder   . History of ectopic pregnancy    Multiple  . IBS (irritable bowel syndrome)    Constipation; Rx for Amitiza worked well.  . Interstitial cystitis    [facility]/Urology; s/p bladder biopsy; s/p cystoscopy x 3 in 2015.  [name] Kidney stones   . Liver function study, abnormal   . Pneumonia   . Pyelonephritis   . Recurrent UTI   . Splenomegaly    s/p GI consult and hematology consult  . Ulcer 09/28/2011   EGD: +gastric ulcer; rx for Dexilant.  [name].    Surgical History:  Past Surgical History:  Procedure Laterality Date  . ABDOMINAL EXPLORATION SURGERY  1997   ruptured tube from eptopic pregnancy  . ABDOMINAL HYSTERECTOMY  08/27/1998   cervical  dysplasia;one ovary remaining  . CESAREAN SECTION     x 2.  . COLONOSCOPY  08/27/2010   [name].    . CYSTOSTOMY W/ BLADDER BIOPSY  05/27/2014   [name]. Benign.  . ESOPHAGOGASTRODUODENOSCOPY  09/28/2011   +gastric ulcer.  [name].  . TONSILLECTOMY  05/27/2014   [facility].    Medications:  Current Outpatient Medications on File Prior to Visit  Medication Sig  . CASCARA SAGRADA PO Take by mouth.  . cyclobenzaprine (FLEXERIL) 5 MG tablet Take 1 tablet (5 mg total) by mouth 3 (three) times daily as needed.  [name] MYRBETRIQ 25 MG TB24 tablet    No current facility-administered medications on file prior to visit.     Allergies:  Allergies  Allergen Reactions  . Meloxicam Other (See Comments)    "MAKES ME FEEL FUNNY", Dizziness    Social History:  Social History   Socioeconomic History  . Marital status: Married    Spouse name: Not on file  . Number of children: 2  . Years of education: 50  . Highest education level: Not on file  Occupational History  . Occupation: Oncologist: self employed  Social Needs  . Financial resource strain: Not on file  . Food insecurity:  Worry: Not on file    Inability: Not on file  . Transportation needs:    Medical: Not on file    Non-medical: Not on file  Tobacco Use  . Smoking status: Never Smoker  . Smokeless tobacco: Never Used  Substance and Sexual Activity  . Alcohol use: Yes    Alcohol/week: 0.0 oz    Comment: 1-2 drinks per month  . Drug use: No  . Sexual activity: Yes    Partners: Male    Birth control/protection: Surgical  Lifestyle  . Physical activity:    Days per week: Not on file    Minutes per session: Not on file  . Stress: Not on file  Relationships  . Social connections:    Talks on phone: Not on file    Gets together: Not on file    Attends religious service: Not on file    Active member of club or organization: Not on file    Attends meetings of clubs or organizations: Not on file    Relationship  status: Not on file  . Intimate partner violence:    Fear of current or ex partner: Not on file    Emotionally abused: Not on file    Physically abused: Not on file    Forced sexual activity: Not on file  Other Topics Concern  . 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      Tobacco:  None      Alcohol: 1-2 servings per month.      Drugs:  none      Exercise:  none      Caffeine use: Coffee, 1 serving per day   Always uses seat belts, smoke alarm and carbon monoxide detector in the home, Guns locked up.   Organ Donor: YES   Patient DOES not have living will, DOES not have HCPOA.         Social History   Tobacco Use  Smoking Status Never Smoker  Smokeless Tobacco Never Used   Social History   Substance and Sexual Activity  Alcohol Use Yes  . Alcohol/week: 0.0 oz   Comment: 1-2 drinks per month    Family History:  Family History  Problem Relation Age of Onset  . Hypertension Mother   . COPD Mother   . Hyperlipidemia Mother   . Alcohol abuse Mother   . Colon polyps Mother   . Heart attack Mother   . Heart disease Mother 71       AMI x 2; stents.  PAD s/p stenting.  . Cancer Father        prostate cancer with mets  . Diabetes Father   . Heart disease Father 77       AMI age 55  . Hyperlipidemia Father   . Hypertension Father   . Mental illness Brother        paranoid schizophrenia  . Hypothyroidism Brother   . Hyperlipidemia Brother   . Stroke Maternal Grandmother   . Heart disease Maternal Grandmother   . Hypertension Maternal Grandmother   . Stroke Maternal Grandfather   . Heart disease Maternal Grandfather   . Hypertension Maternal Grandfather   . Cancer Paternal Grandmother   . Heart disease Paternal Grandmother   .  Diabetes Paternal Grandmother   . Stroke  Paternal Grandfather   . Heart disease Paternal Grandfather   . Cancer Brother 40       lung, germ cell; lobectomy at 30  . Drug abuse Brother   . Hypertension Brother   . Epilepsy Son   . Diabetes Son     Past medical history, surgical history, medications, allergies, family history and social history reviewed with patient today and changes made to appropriate areas of the chart.   Review of Systems  Constitutional: Negative.   HENT: Negative.   Eyes: Negative.   Respiratory: Negative.   Cardiovascular: Negative.   Gastrointestinal: Negative.   Genitourinary: Negative.   Musculoskeletal: Negative.   Skin: Negative.   Neurological: Positive for headaches. Negative for dizziness, tingling, tremors, sensory change, speech change, focal weakness, seizures, loss of consciousness and weakness.  Endo/Heme/Allergies: Positive for polydipsia. Negative for environmental allergies. Does not bruise/bleed easily.  Psychiatric/Behavioral: Negative.  Negative for depression, hallucinations, memory loss, substance abuse and suicidal ideas. The patient is not nervous/anxious and does not have insomnia.        Has been having trouble focusing    All other ROS negative except what is listed above and in the HPI.      Objective:    BP (!) 151/104 (BP Location: Left Arm, Patient Position: Sitting, Cuff Size: Normal)   Pulse 93   Temp 98.2 F (36.8 C)   Ht 5' 4.6" (1.641 m)   Wt 173 lb 3 oz (78.6 kg)   SpO2 99%   BMI 29.18 kg/m   Wt Readings from Last 3 Encounters:  03/13/18 173 lb 3 oz (78.6 kg)  11/15/17 174 lb 5 oz (79.1 kg)  11/01/17 171 lb 2 oz (77.6 kg)    Physical Exam  Constitutional: She is oriented to person, place, and time. She appears well-developed and well-nourished. No distress.  HENT:  Head: Normocephalic and atraumatic.  Right Ear: Hearing, tympanic membrane, external ear and ear canal normal.  Left Ear: Hearing, tympanic membrane, external ear and ear canal normal.    Nose: Nose normal.  Mouth/Throat: Uvula is midline, oropharynx is clear and moist and mucous membranes are normal. No oropharyngeal exudate.  Swollen lymph node on the L at the angle of her mandible  Eyes: Pupils are equal, round, and reactive to light. Conjunctivae, EOM and lids are normal. Right eye exhibits no discharge. Left eye exhibits no discharge. No scleral icterus.  Neck: Normal range of motion. Neck supple. No JVD present. No tracheal deviation present. No thyromegaly present.  Cardiovascular: Normal rate, regular rhythm, normal heart sounds and intact distal pulses. Exam reveals no gallop and no friction rub.  No murmur heard. Pulmonary/Chest: Effort normal and breath sounds normal. No stridor. No respiratory distress. She has no wheezes. She has no rales. She exhibits no tenderness. Right breast exhibits no inverted nipple, no mass, no nipple discharge, no skin change and no tenderness. Left breast exhibits no inverted nipple, no mass, no nipple discharge, no skin change and no tenderness. No breast swelling, tenderness, discharge or bleeding. Breasts are symmetrical.  Abdominal: Soft. Bowel sounds are normal. She exhibits no distension and no mass. There is no tenderness. There is no rebound and no guarding. No hernia.  Musculoskeletal: Normal range of motion. She exhibits no edema, tenderness or deformity.  Lymphadenopathy:    She has no cervical adenopathy.  Neurological: She is alert and oriented to person, place, and time. She displays normal reflexes. No  cranial nerve deficit or sensory deficit. She exhibits normal muscle tone. Coordination normal.  Skin: Skin is warm, dry and intact. Capillary refill takes less than 2 seconds. No rash noted. She is not diaphoretic. No erythema. No pallor.  Psychiatric: She has a normal mood and affect. Her speech is normal and behavior is normal. Judgment and thought content normal. Cognition and memory are normal.  Nursing note and vitals  reviewed.   Results for orders placed or performed in visit on 11/01/17  Veritor Flu A/B Waived  Result Value Ref Range   Influenza A Negative Negative   Influenza B Negative Negative      Assessment & Plan:   Problem List Items Addressed This Visit      Cardiovascular and Mediastinum   Paroxysmal tachycardia ([place])    Under good control today. Continue current regimen. Continue to monitor. Call with any concerns.      Relevant Medications   rosuvastatin (CRESTOR) 10 MG tablet     Other   Mixed hyperlipidemia    Under good control. Continue current regimen. Continue to monitor. Call with any concerns. Refills given today.      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 Await results.       Relevant Medications   gabapentin (NEURONTIN) 100 MG capsule   valACYclovir (VALTREX) 1000 MG tablet   diclofenac (VOLTAREN) 75 MG EC tablet    Other Visit Diagnoses    Routine general medical examination at a health care facility    -  Primary   Vaccines updated. Screening labs checked today. Pap up to date. Mammogram ordered. Colonoscopy up to date. Call wiht any concerns.    Relevant Orders   CBC with Differential/Platelet   Comprehensive metabolic panel   Lipid Panel w/o Chol/HDL Ratio   TSH   UA/M w/rflx Culture, Routine   Immunization due       Tdap given today.   Relevant Orders   Tdap vaccine greater than or equal to 7yo IM (Completed)   Screening mammogram, encounter for       Mammogram ordered today.   Relevant Orders   MM DIGITAL SCREENING BILATERAL   Lymphadenopathy of head and neck       Checking labs and [place]. Await results.    Relevant Orders   US Soft Tissue Head/Neck   Cubital tunnel syndrome on left       Stretched given today. Call if not getting better.    Relevant Medications   gabapentin (NEURONTIN) 100  MG capsule   Multiple joint pain       Relevant Medications   diclofenac (VOLTAREN) 75 MG EC tablet       Follow up plan: Return in about 6 months (around 09/13/2018) for Follow up.   LABORATORY TESTING:  - Pap smear: up to date  IMMUNIZATIONS:   - Tdap: Tetanus vaccination status reviewed: Tdap vaccination indicated and given today. - Influenza: Postponed to flu season - Pneumovax: Not applicable  SCREENING: -Mammogram: Ordered today  - Colonoscopy: Up to date   PATIENT COUNSELING:   Advised to take 1 mg of folate supplement per day if capable of pregnancy.   Sexuality: Discussed sexually transmitted diseases, partner selection, use of condoms, avoidance of unintended pregnancy  and contraceptive alternatives.   Advised to avoid cigarette  smoking.  I discussed with the patient that most people either abstain from alcohol or drink within safe limits (<=14/week and <=4 drinks/occasion for males, <=7/weeks and <= 3 drinks/occasion for females) and that the risk for alcohol disorders and other health effects rises proportionally with the number of drinks per week and how often a drinker exceeds daily limits.  Discussed cessation/primary prevention of drug use and availability of treatment for abuse.   Diet: Encouraged to adjust caloric intake to maintain  or achieve ideal body weight, to reduce intake of dietary saturated fat and total fat, to limit sodium intake by avoiding high sodium foods and not adding table salt, and to maintain adequate dietary potassium and calcium preferably from fresh fruits, vegetables, and low-fat dairy products.    stressed the importance of regular exercise  Injury prevention: Discussed safety belts, safety helmets, smoke detector, smoking near bedding or upholstery.   Dental health: Discussed importance of regular tooth brushing, flossing, and dental visits.    NEXT PREVENTATIVE PHYSICAL DUE IN 1 YEAR. Return in about 6 months (around 09/13/2018)  for Follow up.

## 2018-03-13 NOTE — Assessment & Plan Note (Signed)
Under good control today. Continue current regimen. Continue to monitor. Call with any concerns.  

## 2018-03-13 NOTE — Assessment & Plan Note (Signed)
Under good control. Continue current regimen. Continue to monitor. Call with any concerns. Refills given today. 

## 2018-03-14 LAB — COMPREHENSIVE METABOLIC PANEL
ALT: 33 IU/L — AB (ref 0–32)
AST: 22 IU/L (ref 0–40)
Albumin/Globulin Ratio: 2 (ref 1.2–2.2)
Albumin: 4.3 g/dL (ref 3.5–5.5)
Alkaline Phosphatase: 57 IU/L (ref 39–117)
BUN / CREAT RATIO: 24 — AB (ref 9–23)
BUN: 17 mg/dL (ref 6–24)
Bilirubin Total: 0.4 mg/dL (ref 0.0–1.2)
CALCIUM: 9.8 mg/dL (ref 8.7–10.2)
CHLORIDE: 100 mmol/L (ref 96–106)
CO2: 25 mmol/L (ref 20–29)
CREATININE: 0.7 mg/dL (ref 0.57–1.00)
GFR calc Af Amer: 116 mL/min/{1.73_m2} (ref >59)
GFR calc non Af Amer: 101 mL/min/{1.73_m2} (ref >59)
GLUCOSE: 98 mg/dL (ref 65–99)
Globulin, Total: 2.2 g/dL (ref 1.5–4.5)
Potassium: 3.9 mmol/L (ref 3.5–5.2)
Sodium: 139 mmol/L (ref 134–144)
Total Protein: 6.5 g/dL (ref 6.0–8.5)

## 2018-03-14 LAB — LIPID PANEL W/O CHOL/HDL RATIO
Cholesterol, Total: 188 mg/dL (ref 100–199)
HDL: 60 mg/dL (ref >39)
LDL CALC: 82 mg/dL (ref 0–99)
Triglycerides: 232 mg/dL — ABNORMAL HIGH (ref 0–149)
VLDL CHOLESTEROL CAL: 46 mg/dL — AB (ref 5–40)

## 2018-03-14 LAB — CBC WITH DIFFERENTIAL/PLATELET
BASOS ABS: 0 10*3/uL (ref 0.0–0.2)
Basos: 1 %
EOS (ABSOLUTE): 0.1 10*3/uL (ref 0.0–0.4)
Eos: 2 %
HEMOGLOBIN: 13.6 g/dL (ref 11.1–15.9)
Hematocrit: 40.3 % (ref 34.0–46.6)
IMMATURE GRANS (ABS): 0 10*3/uL (ref 0.0–0.1)
Immature Granulocytes: 0 %
LYMPHS: 33 %
Lymphocytes Absolute: 2 10*3/uL (ref 0.7–3.1)
MCH: 31.2 pg (ref 26.6–33.0)
MCHC: 33.7 g/dL (ref 31.5–35.7)
MCV: 92 fL (ref 79–97)
MONOCYTES: 5 %
Monocytes Absolute: 0.3 10*3/uL (ref 0.1–0.9)
NEUTROS PCT: 59 %
Neutrophils Absolute: 3.5 10*3/uL (ref 1.4–7.0)
Platelets: 239 10*3/uL (ref 150–450)
RBC: 4.36 x10E6/uL (ref 3.77–5.28)
RDW: 12.1 % — ABNORMAL LOW (ref 12.3–15.4)
WBC: 5.9 10*3/uL (ref 3.4–10.8)

## 2018-03-14 LAB — TSH: TSH: 1.51 u[IU]/mL (ref 0.450–4.500)

## 2018-03-17 ENCOUNTER — Telehealth: Payer: Self-pay | Admitting: Family Medicine

## 2018-03-17 NOTE — Telephone Encounter (Signed)
Copied from [place] [id number]. Topic: Quick Communication - Lab Results >> Mar 17, 2018  2:15 PM Robina Ade, Helene Kelp D wrote: Patient called and would like her lab results given to her. Labs no available for nurse triage to give out. Please call patient back, thanks.

## 2018-03-17 NOTE — Telephone Encounter (Signed)
Patient has started using mychart

## 2018-03-17 NOTE — Telephone Encounter (Signed)
Please let her know that her labs are normal. They were released to mychart.

## 2018-03-18 ENCOUNTER — Telehealth: Payer: Self-pay | Admitting: Family Medicine

## 2018-03-18 ENCOUNTER — Ambulatory Visit
Admission: RE | Admit: 2018-03-18 | Discharge: 2018-03-18 | Disposition: A | Discharge status: Home / Self Care | Payer: BLUE CROSS/BLUE SHIELD | Source: Ambulatory Visit | Attending: Family Medicine | Admitting: Family Medicine

## 2018-03-18 DIAGNOSIS — R591 Generalized enlarged lymph nodes: Secondary | ICD-10-CM

## 2018-03-18 DIAGNOSIS — R590 Localized enlarged lymph nodes: Secondary | ICD-10-CM | POA: Insufficient documentation

## 2018-03-18 NOTE — Telephone Encounter (Addendum)
Please let her know that the US showed a normal, not enlarged benign appearing lymph node. If it doesn't seem like it's going away or it's getting bigger, let me know and we'll get a biopsy on it, but it doesn't look like anything to worry about right now.

## 2018-03-19 NOTE — Telephone Encounter (Signed)
Patient notified

## 2018-04-16 ENCOUNTER — Other Ambulatory Visit: Payer: Self-pay

## 2018-04-16 ENCOUNTER — Ambulatory Visit: Payer: Self-pay | Admitting: *Deleted

## 2018-04-16 ENCOUNTER — Encounter: Payer: Self-pay | Admitting: Physician Assistant

## 2018-04-16 ENCOUNTER — Ambulatory Visit (INDEPENDENT_AMBULATORY_CARE_PROVIDER_SITE_OTHER): Payer: BLUE CROSS/BLUE SHIELD | Admitting: Physician Assistant

## 2018-04-16 VITALS — BP 125/84 | HR 73 | Temp 98.1°F | Ht 64.6 in | Wt 171.0 lb

## 2018-04-16 DIAGNOSIS — I10 Essential (primary) hypertension: Secondary | ICD-10-CM | POA: Diagnosis not present

## 2018-04-16 MED ORDER — LISINOPRIL 5 MG PO TABS: 5.0000 mg | ORAL_TABLET | Freq: Every day | ORAL | 0 refills | Status: DC

## 2018-04-16 NOTE — Progress Notes (Signed)
Subjective:    Patient ID: Tracey Weaver, female    DOB: 04-Dec-1965, 52 y.o.   MRN: [id number]  Tracey Weaver is a 52 y.o. female presenting on 04/16/2018 for Hypertension (pt states she has been having high blood pressure that seems is causing her headaches x 1 month/ last night 148/108///this am 143/97)   HPI   Reports having elevated Bps, systolic in the 193'X. Does have headaches during these times. Does not measure BP at any other time. She has historically had elevated BP, has not been treated for this. No chest pain, SOB, diaphoresis, vision change.   BP Readings from Last 3 Encounters:  04/16/18 125/84  03/13/18 (!) 141/92  11/15/17 133/82   Wt Readings from Last 3 Encounters:  04/16/18 171 lb (77.6 kg)  03/13/18 173 lb 3 oz (78.6 kg)  11/15/17 174 lb 5 oz (79.1 kg)    Social History   Tobacco Use  . Smoking status: Never Smoker  . Smokeless tobacco: Never Used  Substance Use Topics  . Alcohol use: Yes    Alcohol/week: 0.0 standard drinks    Comment: 1-2 drinks per month  . Drug use: No    Review of Systems Per HPI unless specifically indicated above     Objective:    BP 125/84   Pulse 73   Temp 98.1 F (36.7 C) (Oral)   Ht 5' 4.6" (1.641 m)   Wt 171 lb (77.6 kg)   SpO2 98%   BMI 28.81 kg/m   Wt Readings from Last 3 Encounters:  04/16/18 171 lb (77.6 kg)  03/13/18 173 lb 3 oz (78.6 kg)  11/15/17 174 lb 5 oz (79.1 kg)    Physical Exam  Constitutional: She is oriented to person, place, and time. She appears well-developed and well-nourished.  Cardiovascular: Normal rate and regular rhythm.  Pulmonary/Chest: Effort normal and breath sounds normal.  Neurological: She is alert and oriented to person, place, and time.  Skin: Skin is warm and dry.  Psychiatric: She has a normal mood and affect. Her behavior is normal.   Results for orders placed or performed in visit on 03/13/18  CBC with Differential/Platelet  Result Value Ref Range   WBC 5.9 3.4 -  10.8 x10E3/uL   RBC 4.36 3.77 - 5.28 x10E6/uL   Hemoglobin 13.6 11.1 - 15.9 g/dL   Hematocrit 40.3 34.0 - 46.6 %   MCV 92 79 - 97 fL   MCH 31.2 26.6 - 33.0 pg   MCHC 33.7 31.5 - 35.7 g/dL   RDW 12.1 (L) 12.3 - 15.4 %   Platelets 239 150 - 450 x10E3/uL   Neutrophils 59 Not Estab. %   Lymphs 33 Not Estab. %   Monocytes 5 Not Estab. %   Eos 2 Not Estab. %   Basos 1 Not Estab. %   Neutrophils Absolute 3.5 1.4 - 7.0 x10E3/uL   Lymphocytes Absolute 2.0 0.7 - 3.1 x10E3/uL   Monocytes Absolute 0.3 0.1 - 0.9 x10E3/uL   EOS (ABSOLUTE) 0.1 0.0 - 0.4 x10E3/uL   Basophils Absolute 0.0 0.0 - 0.2 x10E3/uL   Immature Granulocytes 0 Not Estab. %   Immature Grans (Abs) 0.0 0.0 - 0.1 x10E3/uL  Comprehensive metabolic panel  Result Value Ref Range   Glucose 98 65 - 99 mg/dL   BUN 17 6 - 24 mg/dL   Creatinine, Ser 0.70 0.57 - 1.00 mg/dL   GFR calc non Af Amer 101 >59 mL/min/1.73   GFR calc Af Wyvonnia Lora  116 >59 mL/min/1.73   BUN/Creatinine Ratio 24 (H) 9 - 23   Sodium 139 134 - 144 mmol/L   Potassium 3.9 3.5 - 5.2 mmol/L   Chloride 100 96 - 106 mmol/L   CO2 25 20 - 29 mmol/L   Calcium 9.8 8.7 - 10.2 mg/dL   Total Protein 6.5 6.0 - 8.5 g/dL   Albumin 4.3 3.5 - 5.5 g/dL   Globulin, Total 2.2 1.5 - 4.5 g/dL   Albumin/Globulin Ratio 2.0 1.2 - 2.2   Bilirubin Total 0.4 0.0 - 1.2 mg/dL   Alkaline Phosphatase 57 39 - 117 IU/L   AST 22 0 - 40 IU/L   ALT 33 (H) 0 - 32 IU/L  Lipid Panel w/o Chol/HDL Ratio  Result Value Ref Range   Cholesterol, Total 188 100 - 199 mg/dL   Triglycerides 232 (H) 0 - 149 mg/dL   HDL 60 >39 mg/dL   VLDL Cholesterol Cal 46 (H) 5 - 40 mg/dL   LDL Calculated 82 0 - 99 mg/dL  TSH  Result Value Ref Range   TSH 1.510 0.450 - 4.500 uIU/mL  UA/M w/rflx Culture, Routine  Result Value Ref Range   Specific Gravity, UA >1.030 (H) 1.005 - 1.030   pH, UA 5.5 5.0 - 7.5   Color, UA Yellow Yellow   Appearance Ur Cloudy (A) Clear   Leukocytes, UA Negative Negative   Protein, UA Trace  (A) Negative/Trace   Glucose, UA Negative Negative   Ketones, UA Negative Negative   RBC, UA Negative Negative   Bilirubin, UA Negative Negative   Urobilinogen, Ur 0.2 0.2 - 1.0 mg/dL   Nitrite, UA Negative Negative      Assessment & Plan:  1. Hypertension, unspecified type  Start low dose 5 mg Lisinopril today. Follow up in one month with [facility]. Counseled on adverse effects.    Follow up plan: Return in about 1 month (around 05/17/2018) for dr Wynetta Emery, HTN.  Carles Collet, [name] [facility] 04/17/2018, 4:23 PM

## 2018-04-16 NOTE — Telephone Encounter (Signed)
Pt called with having b/p elevation every night. She states she develops a headache and takes her b/p and it has been elevated to 148/108. She will other symptoms but only a headache today. She felt numb yesterday and sometimes feel light headed. No shortness of breath, chest pain, weakness, sweating or nausea or vomiting. Her b/p this morning is 143/97. She said usually the bottom number is over 100 when she checks it. Appointment scheduled for this morning. Will notify flow at [facility].   Reason for Disposition . [4] Systolic BP  >= 098 OR Diastolic >= 80 AND [1] not taking BP medications  Answer Assessment - Initial Assessment Questions 1. BLOOD PRESSURE: "What is the blood pressure?" "Did you take at least two measurements 5 minutes apart?"     143/97 hr in the 80's 2. ONSET: "When did you take your blood pressure?"     This morning and every night 3. HOW: "How did you obtain the blood pressure?" (e.g., visiting nurse, automatic home BP monitor)     Automatic home BP monitor 4. HISTORY: "Do you have a history of high blood pressure?"     no 5. MEDICATIONS: "Are you taking any medications for blood pressure?" "Have you missed any doses recently?"     no 6. OTHER SYMPTOMS: "Do you have any symptoms?" (e.g., headache, chest pain, blurred vision, difficulty breathing, weakness)     Headache, felt numb yesterday, light headed at time 7. PREGNANCY: "Is there any chance you are pregnant?" "When was your last menstrual period?"     No periods  Protocols used: HIGH BLOOD PRESSURE-A-AH

## 2018-04-16 NOTE — Patient Instructions (Signed)

## 2018-04-24 DIAGNOSIS — Z1283 Encounter for screening for malignant neoplasm of skin: Secondary | ICD-10-CM | POA: Diagnosis not present

## 2018-04-24 DIAGNOSIS — L578 Other skin changes due to chronic exposure to nonionizing radiation: Secondary | ICD-10-CM | POA: Diagnosis not present

## 2018-04-24 DIAGNOSIS — Z86018 Personal history of other benign neoplasm: Secondary | ICD-10-CM | POA: Diagnosis not present

## 2018-04-24 DIAGNOSIS — B078 Other viral warts: Secondary | ICD-10-CM | POA: Diagnosis not present

## 2018-05-05 ENCOUNTER — Ambulatory Visit
Admission: RE | Admit: 2018-05-05 | Discharge: 2018-05-05 | Disposition: A | Discharge status: Home / Self Care | Payer: BLUE CROSS/BLUE SHIELD | Source: Ambulatory Visit | Attending: Family Medicine | Admitting: Family Medicine

## 2018-05-05 DIAGNOSIS — Z1231 Encounter for screening mammogram for malignant neoplasm of breast: Secondary | ICD-10-CM | POA: Diagnosis not present

## 2018-05-19 ENCOUNTER — Other Ambulatory Visit: Payer: Self-pay

## 2018-05-19 ENCOUNTER — Ambulatory Visit (INDEPENDENT_AMBULATORY_CARE_PROVIDER_SITE_OTHER): Payer: BLUE CROSS/BLUE SHIELD | Admitting: Family Medicine

## 2018-05-19 ENCOUNTER — Encounter: Payer: Self-pay | Admitting: Family Medicine

## 2018-05-19 VITALS — BP 118/80 | HR 66 | Temp 97.3°F | Ht 64.6 in | Wt 167.0 lb

## 2018-05-19 DIAGNOSIS — I10 Essential (primary) hypertension: Secondary | ICD-10-CM

## 2018-05-19 HISTORY — DX: Essential (primary) hypertension: I10

## 2018-05-19 MED ORDER — LISINOPRIL 5 MG PO TABS: 5.0000 mg | ORAL_TABLET | Freq: Every day | ORAL | 1 refills | Status: DC

## 2018-05-19 NOTE — Assessment & Plan Note (Signed)
Under good control on current regimen. Continue current regimen. Continue to monitor. Call with any concerns. Refills given.   

## 2018-05-19 NOTE — Progress Notes (Signed)
BP 118/80   Pulse 66   Temp (!) 97.3 F (36.3 C) (Oral)   Ht 5' 4.6" (1.641 m)   Wt 167 lb (75.8 kg)   SpO2 99%   BMI 28.14 kg/m    Subjective:    Patient ID: Tracey Weaver, female    DOB: 1966/03/08, 52 y.o.   MRN: [id number]  HPI: Tracey Weaver is a 52 y.o. female  Chief Complaint  Patient presents with  . Hypertension    f/u    HYPERTENSION Hypertension status: better  Satisfied with current treatment? yes Duration of hypertension: chronic BP monitoring frequency:  not checking BP medication side effects:  no Medication compliance: excellent compliance Previous BP meds: lisinopril 5mg  Aspirin: no Recurrent headaches: no Visual changes: no Palpitations: no Dyspnea: no Chest pain: no Lower extremity edema: no Dizzy/lightheaded: no   Relevant past medical, surgical, family and social history reviewed and updated as indicated. Interim medical history since our last visit reviewed. Allergies and medications reviewed and updated.  Review of Systems  Constitutional: Negative.   Respiratory: Negative.   Cardiovascular: Negative.   Psychiatric/Behavioral: Negative.     Per HPI unless specifically indicated above     Objective:    BP 118/80   Pulse 66   Temp (!) 97.3 F (36.3 C) (Oral)   Ht 5' 4.6" (1.641 m)   Wt 167 lb (75.8 kg)   SpO2 99%   BMI 28.14 kg/m   Wt Readings from Last 3 Encounters:  05/19/18 167 lb (75.8 kg)  04/16/18 171 lb (77.6 kg)  03/13/18 173 lb 3 oz (78.6 kg)    Physical Exam  Constitutional: She is oriented to person, place, and time. She appears well-developed and well-nourished. No distress.  HENT:  Head: Normocephalic and atraumatic.  Right Ear: Hearing normal.  Left Ear: Hearing normal.  Nose: Nose normal.  Eyes: Conjunctivae and lids are normal. Right eye exhibits no discharge. Left eye exhibits no discharge. No scleral icterus.  Pulmonary/Chest: Effort normal. No respiratory distress.  Musculoskeletal: Normal range of  motion.  Neurological: She is alert and oriented to person, place, and time.  Skin: Skin is intact. No rash noted. She is not diaphoretic.  Psychiatric: She has a normal mood and affect. Her speech is normal and behavior is normal. Judgment and thought content normal. Cognition and memory are normal.    Results for orders placed or performed in visit on 03/13/18  CBC with Differential/Platelet  Result Value Ref Range   WBC 5.9 3.4 - 10.8 x10E3/uL   RBC 4.36 3.77 - 5.28 x10E6/uL   Hemoglobin 13.6 11.1 - 15.9 g/dL   Hematocrit 40.3 34.0 - 46.6 %   MCV 92 79 - 97 fL   MCH 31.2 26.6 - 33.0 pg   MCHC 33.7 31.5 - 35.7 g/dL   RDW 12.1 (L) 12.3 - 15.4 %   Platelets 239 150 - 450 x10E3/uL   Neutrophils 59 Not Estab. %   Lymphs 33 Not Estab. %   Monocytes 5 Not Estab. %   Eos 2 Not Estab. %   Basos 1 Not Estab. %   Neutrophils Absolute 3.5 1.4 - 7.0 x10E3/uL   Lymphocytes Absolute 2.0 0.7 - 3.1 x10E3/uL   Monocytes Absolute 0.3 0.1 - 0.9 x10E3/uL   EOS (ABSOLUTE) 0.1 0.0 - 0.4 x10E3/uL   Basophils Absolute 0.0 0.0 - 0.2 x10E3/uL   Immature Granulocytes 0 Not Estab. %   Immature Grans (Abs) 0.0 0.0 - 0.1 x10E3/uL  Comprehensive metabolic panel  Result Value Ref Range   Glucose 98 65 - 99 mg/dL   BUN 17 6 - 24 mg/dL   Creatinine, Ser 0.70 0.57 - 1.00 mg/dL   GFR calc non Af Amer 101 >59 mL/min/1.73   GFR calc Af Amer 116 >59 mL/min/1.73   BUN/Creatinine Ratio 24 (H) 9 - 23   Sodium 139 134 - 144 mmol/L   Potassium 3.9 3.5 - 5.2 mmol/L   Chloride 100 96 - 106 mmol/L   CO2 25 20 - 29 mmol/L   Calcium 9.8 8.7 - 10.2 mg/dL   Total Protein 6.5 6.0 - 8.5 g/dL   Albumin 4.3 3.5 - 5.5 g/dL   Globulin, Total 2.2 1.5 - 4.5 g/dL   Albumin/Globulin Ratio 2.0 1.2 - 2.2   Bilirubin Total 0.4 0.0 - 1.2 mg/dL   Alkaline Phosphatase 57 39 - 117 IU/L   AST 22 0 - 40 IU/L   ALT 33 (H) 0 - 32 IU/L  Lipid Panel w/o Chol/HDL Ratio  Result Value Ref Range   Cholesterol, Total 188 100 - 199 mg/dL    Triglycerides 232 (H) 0 - 149 mg/dL   HDL 60 >39 mg/dL   VLDL Cholesterol Cal 46 (H) 5 - 40 mg/dL   LDL Calculated 82 0 - 99 mg/dL  TSH  Result Value Ref Range   TSH 1.510 0.450 - 4.500 uIU/mL  UA/M w/rflx Culture, Routine  Result Value Ref Range   Specific Gravity, UA >1.030 (H) 1.005 - 1.030   pH, UA 5.5 5.0 - 7.5   Color, UA Yellow Yellow   Appearance Ur Cloudy (A) Clear   Leukocytes, UA Negative Negative   Protein, UA Trace (A) Negative/Trace   Glucose, UA Negative Negative   Ketones, UA Negative Negative   RBC, UA Negative Negative   Bilirubin, UA Negative Negative   Urobilinogen, Ur 0.2 0.2 - 1.0 mg/dL   Nitrite, UA Negative Negative      Assessment & Plan:   Problem List Items Addressed This Visit      Cardiovascular and Mediastinum   Hypertension - Primary    Under good control on current regimen. Continue current regimen. Continue to monitor. Call with any concerns. Refills given.        Relevant Medications   lisinopril (PRINIVIL,ZESTRIL) 5 MG tablet   Other Relevant Orders   Basic metabolic panel       Follow up plan: Return in about 6 months (around 11/17/2018) for Follow up BP/cholesterol.

## 2018-05-20 LAB — BASIC METABOLIC PANEL
BUN/Creatinine Ratio: 27 — ABNORMAL HIGH (ref 9–23)
BUN: 13 mg/dL (ref 6–24)
CALCIUM: 9.5 mg/dL (ref 8.7–10.2)
CHLORIDE: 102 mmol/L (ref 96–106)
CO2: 26 mmol/L (ref 20–29)
Creatinine, Ser: 0.49 mg/dL — ABNORMAL LOW (ref 0.57–1.00)
GFR calc Af Amer: 130 mL/min/{1.73_m2} (ref >59)
GFR calc non Af Amer: 113 mL/min/{1.73_m2} (ref >59)
GLUCOSE: 81 mg/dL (ref 65–99)
Potassium: 4 mmol/L (ref 3.5–5.2)
Sodium: 141 mmol/L (ref 134–144)

## 2018-09-15 ENCOUNTER — Encounter: Payer: Self-pay | Admitting: Family Medicine

## 2018-09-15 ENCOUNTER — Ambulatory Visit: Payer: BLUE CROSS/BLUE SHIELD | Admitting: Family Medicine

## 2018-10-08 DIAGNOSIS — N3010 Interstitial cystitis (chronic) without hematuria: Secondary | ICD-10-CM | POA: Diagnosis not present

## 2018-10-08 DIAGNOSIS — R102 Pelvic and perineal pain: Secondary | ICD-10-CM | POA: Diagnosis not present

## 2018-10-27 DIAGNOSIS — M25552 Pain in left hip: Secondary | ICD-10-CM | POA: Diagnosis not present

## 2018-11-04 DIAGNOSIS — M7062 Trochanteric bursitis, left hip: Secondary | ICD-10-CM | POA: Insufficient documentation

## 2018-11-04 DIAGNOSIS — Z13820 Encounter for screening for osteoporosis: Secondary | ICD-10-CM | POA: Diagnosis not present

## 2018-11-04 DIAGNOSIS — M2550 Pain in unspecified joint: Secondary | ICD-10-CM | POA: Diagnosis not present

## 2018-11-17 ENCOUNTER — Ambulatory Visit: Payer: BLUE CROSS/BLUE SHIELD | Admitting: Family Medicine

## 2018-11-21 ENCOUNTER — Encounter: Payer: Self-pay | Admitting: Family Medicine

## 2018-11-21 ENCOUNTER — Ambulatory Visit (INDEPENDENT_AMBULATORY_CARE_PROVIDER_SITE_OTHER): Payer: BLUE CROSS/BLUE SHIELD | Admitting: Family Medicine

## 2018-11-21 VITALS — BP 118/79 | Wt 159.0 lb

## 2018-11-21 DIAGNOSIS — R232 Flushing: Secondary | ICD-10-CM

## 2018-11-21 DIAGNOSIS — I10 Essential (primary) hypertension: Secondary | ICD-10-CM | POA: Diagnosis not present

## 2018-11-21 DIAGNOSIS — M2550 Pain in unspecified joint: Secondary | ICD-10-CM | POA: Diagnosis not present

## 2018-11-21 DIAGNOSIS — E782 Mixed hyperlipidemia: Secondary | ICD-10-CM

## 2018-11-21 MED ORDER — DICLOFENAC SODIUM 75 MG PO TBEC: 75.0000 mg | DELAYED_RELEASE_TABLET | Freq: Two times a day (BID) | ORAL | 1 refills | Status: DC | PRN

## 2018-11-21 MED ORDER — LISINOPRIL 5 MG PO TABS: 5.0000 mg | ORAL_TABLET | Freq: Every day | ORAL | 1 refills | Status: DC

## 2018-11-21 MED ORDER — ESTROGENS CONJUGATED 0.625 MG PO TABS: 0.6250 mg | ORAL_TABLET | Freq: Every day | ORAL | 6 refills | Status: DC

## 2018-11-21 MED ORDER — ROSUVASTATIN CALCIUM 10 MG PO TABS: 10.0000 mg | ORAL_TABLET | Freq: Every day | ORAL | 1 refills | Status: DC

## 2018-11-21 MED ORDER — GABAPENTIN 100 MG PO CAPS: 100.0000 mg | ORAL_CAPSULE | Freq: Every day | ORAL | 1 refills | Status: DC

## 2018-11-21 NOTE — Assessment & Plan Note (Signed)
Stable. Refill of premarin given today. Call with any concerns.

## 2018-11-21 NOTE — Progress Notes (Signed)
BP 118/79 Comment: Patient reported  Wt 159 lb (72.1 kg) Comment: Patient reported  BMI 26.79 kg/m    Subjective:    Patient ID: Tracey Weaver, female    DOB: 1966-07-02, 53 y.o.   MRN: [id number]  HPI: Tracey Weaver is a 53 y.o. female  Chief Complaint  Patient presents with  . Hypertension  . Medication Refill    voltaren and gabapentin  . Hyperlipidemia  . TELEMEDICINE VISIT   HYPERTENSION / HYPERLIPIDEMIA Satisfied with current treatment? yes Duration of hypertension: chronic BP monitoring frequency: not checking BP medication side effects: no Past BP meds: lisinopril Duration of hyperlipidemia: chronic Cholesterol medication side effects: no Cholesterol supplements: none Past cholesterol medications: crestor Medication compliance: excellent compliance Aspirin: no Recent stressors: no Recurrent headaches: no Visual changes: no Palpitations: no Dyspnea: no Chest pain: no Lower extremity edema: no Dizzy/lightheaded: no  Relevant past medical, surgical, family and social history reviewed and updated as indicated. Interim medical history since our last visit reviewed. Allergies and medications reviewed and updated.  Review of Systems  Constitutional: Negative.   HENT: Negative.   Respiratory: Negative.   Cardiovascular: Negative.   Gastrointestinal: Negative.   Musculoskeletal: Negative.   Neurological: Negative.   Psychiatric/Behavioral: Negative.     Per HPI unless specifically indicated above     Objective:    BP 118/79 Comment: Patient reported  Wt 159 lb (72.1 kg) Comment: Patient reported  BMI 26.79 kg/m   Wt Readings from Last 3 Encounters:  11/21/18 159 lb (72.1 kg)  05/19/18 167 lb (75.8 kg)  04/16/18 171 lb (77.6 kg)    Physical Exam Constitutional:      General: She is not in acute distress.    Appearance: Normal appearance. She is well-developed and normal weight. She is not ill-appearing, toxic-appearing or diaphoretic.  HENT:    Head: Normocephalic and atraumatic.     Right Ear: Hearing and external ear normal.     Left Ear: Hearing and external ear normal.     Nose: Nose normal.     Mouth/Throat:     Mouth: Mucous membranes are moist.  Eyes:     General: Lids are normal. No scleral icterus.       Right eye: No discharge.        Left eye: No discharge.     Extraocular Movements: Extraocular movements intact.     Conjunctiva/sclera: Conjunctivae normal.     Pupils: Pupils are equal, round, and reactive to light.  Neck:     Musculoskeletal: Normal range of motion.  Pulmonary:     Effort: Pulmonary effort is normal. No respiratory distress.     Comments: Speaking in full sentences, breathing comfortably Musculoskeletal: Normal range of motion.  Skin:    Coloration: Skin is not jaundiced or pale.     Findings: No bruising, erythema, lesion or rash.  Neurological:     Mental Status: She is alert and oriented to person, place, and time. Mental status is at baseline.  Psychiatric:        Mood and Affect: Mood normal.        Speech: Speech normal.        Behavior: Behavior normal.        Thought Content: Thought content normal.        Judgment: Judgment normal.     Results for orders placed or performed in visit on 67/61/95  Basic metabolic panel  Result Value Ref Range   Glucose 81 65 -  99 mg/dL   BUN 13 6 - 24 mg/dL   Creatinine, Ser 0.49 (L) 0.57 - 1.00 mg/dL   GFR calc non Af Amer 113 >59 mL/min/1.73   GFR calc Af Amer 130 >59 mL/min/1.73   BUN/Creatinine Ratio 27 (H) 9 - 23   Sodium 141 134 - 144 mmol/L   Potassium 4.0 3.5 - 5.2 mmol/L   Chloride 102 96 - 106 mmol/L   CO2 26 20 - 29 mmol/L   Calcium 9.5 8.7 - 10.2 mg/dL      Assessment & Plan:   Problem List Items Addressed This Visit      Cardiovascular and Mediastinum   Hot flashes    Stable. Refill of premarin given today. Call with any concerns.       Relevant Medications   lisinopril (PRINIVIL,ZESTRIL) 5 MG tablet   rosuvastatin  (CRESTOR) 10 MG tablet   Hypertension - Primary    Under good control on current regimen. Continue current regimen. Continue to monitor. Call with any concerns. Refills given. Labs to be drawn.       Relevant Medications   lisinopril (PRINIVIL,ZESTRIL) 5 MG tablet   rosuvastatin (CRESTOR) 10 MG tablet   Other Relevant Orders   Comprehensive metabolic panel     Other   Mixed hyperlipidemia    Under good control on current regimen. Continue current regimen. Continue to monitor. Call with any concerns. Refills given. Labs to be drawn.      Relevant Medications   lisinopril (PRINIVIL,ZESTRIL) 5 MG tablet   rosuvastatin (CRESTOR) 10 MG tablet   Other Relevant Orders   Comprehensive metabolic panel   Lipid Panel w/o Chol/HDL Ratio    Other Visit Diagnoses    Multiple joint pain       Doing well with gabapentin and voltaren. Refills given today.   Relevant Medications   diclofenac (VOLTAREN) 75 MG EC tablet       Follow up plan: Return in about 6 months (around 05/24/2019) for Physical.    . This visit was completed via WebEx due to the restrictions of the COVID-19 pandemic. All issues as above were discussed and addressed. Physical exam was done as above through visual confirmation on WebEx. If it was felt that the patient should be evaluated in the office, they were directed there. The patient verbally consented to this visit. . Location of the patient: home . Location of the provider: work . Those involved with this call:  . Provider: Park Liter, DO . CMA: Tiffany Reel, CMA . Front Desk/Registration: Don Perking  . Time spent on call: 20 minutes with patient face to face via video conference. More than 50% of this time was spent in counseling and coordination of care.

## 2018-11-21 NOTE — Assessment & Plan Note (Signed)
Under good control on current regimen. Continue current regimen. Continue to monitor. Call with any concerns. Refills given. Labs to be drawn.  

## 2018-12-10 ENCOUNTER — Other Ambulatory Visit: Payer: Self-pay | Admitting: Family Medicine

## 2018-12-10 NOTE — Telephone Encounter (Signed)
Requested Prescriptions  Pending Prescriptions Disp Refills  . PREMARIN 0.625 MG tablet [Pharmacy Med Name: PREMARIN 0.625 MG TABLET] 84 tablet 1    Sig: TAKE 1 TABLET (0.625 MG TOTAL) BY MOUTH DAILY. TAKE DAILY FOR 21 DAYS THEN DO NOT TAKE FOR 7 DAYS.     OB/GYN:  Estrogens Failed - 12/10/2018 10:33 AM      Failed - Mammogram is up-to-date per Health Maintenance      Passed - Last BP in normal range    BP Readings from Last 1 Encounters:  11/21/18 118/79         Passed - Valid encounter within last 12 months    Recent Outpatient Visits          2 weeks ago Essential hypertension   [facility], Megan P, DO   6 months ago Hypertension, unspecified type   [facility], Megan P, DO   7 months ago Hypertension, unspecified type   [place], [place], [name]   9 months ago Routine general medical examination at a health care facility   [facility], [place], DO   1 year ago Acute bronchitis, unspecified organism   [facility], [place], DO

## 2018-12-18 ENCOUNTER — Telehealth: Payer: Self-pay | Admitting: Cardiovascular Disease

## 2018-12-18 NOTE — Telephone Encounter (Signed)
mychart consent pending

## 2018-12-22 NOTE — Progress Notes (Signed)
Virtual Visit via Video Note   This visit type was conducted due to national recommendations for restrictions regarding the COVID-19 Pandemic (e.g. social distancing) in an effort to limit this patient's exposure and mitigate transmission in our community.  Due to her co-morbid illnesses, this patient is at least at moderate risk for complications without adequate follow up.  This format is felt to be most appropriate for this patient at this time.  All issues noted in this document were discussed and addressed.  A limited physical exam was performed with this format.  Please refer to the patient's chart for her consent to telehealth for [facility].   I connected with  [name] on 12/22/18 by a video enabled telemedicine application and verified that I am speaking with the correct person using two identifiers. I discussed the limitations of evaluation and management by telemedicine. The patient expressed understanding and agreed to proceed.   Evaluation Performed:  Follow-up visit  Date:  12/22/2018   ID:  Tracey Weaver, DOB [birth year], MRN [id number]  Patient Location:  [place] [id number]   Provider location:   [facility], [place] office  PCP:  Valerie Roys, DO  Cardiologist:  Patsy Baltimore   Chief Complaint: Stress, out of work secondary to viral pandemic and taking care of her mother in hospice   History of Present Illness:    Tracey Weaver is a 53 y.o. female who presents via audio/video conferencing for a telehealth visit today.   The patient does not symptoms concerning for COVID-19 infection (fever, chills, cough, or new SHORTNESS OF BREATH).   Patient has a past medical history of With history of depression, anxiety Chest pain CT scan from 2011 no significant coronary calcifications, no plaque in the aorta Who presents for follow-up of her chest pain, Tachycardia, risk factors for coronary disease  long history of  left-sided chest pain Symptoms started several years ago, last for hours, mild, dull left parasternal, Sometimes when she has episodes she is able to push on her left chest just to breathe,   not related to exertion  Every now and then she will have some discomfort Nothing with exertion Not worried about it as it is not escalating, not frequent, atypical  hairdresser, 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 She does not appreciate the tachycardia, but watch tells her that she has frequent episodes, seems to be happening daily.   More recently has had no significant tachycardia Going over to her mothers 3 times a week as she is on hospice  parents had atrial fibrillation  Lab work reviewed with her Total chol 188, LDL 82 in 02/2017  Had previous stress test Stress echocardiogram June 2016 in [place], performed for chest pain Stress test with no ischemia, normal LV function  No prior smoking history Both parents with significant coronary disease   Prior CV studies:   The following studies were reviewed today:    Past Medical History:  Diagnosis Date  . Adjustment disorder with depressed mood   . Anxiety   . Cervical cancer (HCC)    precancerous cells  . Chest pain   . Constipation   . Degenerative disc disease, cervical   . Dysplastic nevus 08/27/2013   s/p resection by Dr. Aubery Lapping.  . Dysthymic disorder   .  History of ectopic pregnancy    Multiple  . IBS (irritable bowel syndrome)    Constipation; Rx for Amitiza worked well.  . Interstitial cystitis    [facility]/Urology; s/p bladder biopsy; s/p cystoscopy x 3 in 2015.  [name] Kidney stones   . Liver function study, abnormal   . Pneumonia   . Pyelonephritis   . Recurrent UTI   . Splenomegaly    s/p GI consult and hematology consult  . Ulcer 09/28/2011   EGD: +gastric  ulcer; rx for Dexilant.  [name].   Past Surgical History:  Procedure Laterality Date  . ABDOMINAL EXPLORATION SURGERY  1997   ruptured tube from eptopic pregnancy  . ABDOMINAL HYSTERECTOMY  08/27/1998   cervical dysplasia;one ovary remaining  . CESAREAN SECTION     x 2.  . COLONOSCOPY  08/27/2010   [name].    . CYSTOSTOMY W/ BLADDER BIOPSY  05/27/2014   [name]. Benign.  . ESOPHAGOGASTRODUODENOSCOPY  09/28/2011   +gastric ulcer.  [name].  . TONSILLECTOMY  05/27/2014   [facility].     No outpatient medications have been marked as taking for the 12/23/18 encounter (Appointment) with Minna Merritts, MD.     Allergies:   Meloxicam   Social History   Tobacco Use  . Smoking status: Never Smoker  . Smokeless tobacco: Never Used  Substance Use Topics  . Alcohol use: Yes    Alcohol/week: 0.0 standard drinks    Comment: 1-2 drinks per month  . Drug use: No     Current Outpatient Medications on File Prior to Visit  Medication Sig Dispense Refill  . amitriptyline (ELAVIL) 10 MG tablet     . CASCARA SAGRADA PO Take by mouth.    . cyclobenzaprine (FLEXERIL) 5 MG tablet Take by mouth.    . diclofenac (VOLTAREN) 75 MG EC tablet Take 1 tablet (75 mg total) by mouth 2 (two) times daily as needed. 180 tablet 1  . fluconazole (DIFLUCAN) 150 MG tablet fluconazole 150 mg tablet    . gabapentin (NEURONTIN) 100 MG capsule Take 1 capsule (100 mg total) by mouth at bedtime. 90 capsule 1  . lisinopril (PRINIVIL,ZESTRIL) 5 MG tablet Take 1 tablet (5 mg total) by mouth daily. 90 tablet 1  . PREMARIN 0.625 MG tablet TAKE 1 TABLET (0.625 MG TOTAL) BY MOUTH DAILY. TAKE DAILY FOR 21 DAYS THEN DO NOT TAKE FOR 7 DAYS. 84 tablet 1  . RETIN-A MICRO PUMP 0.06 % GEL APPLY 1 APPLICATION ON THE SKIN AT NIGHT/BEDTIME  0  . rosuvastatin (CRESTOR) 10 MG tablet Take 1 tablet (10 mg total) by mouth daily. 90 tablet 1  . sulfamethoxazole-trimethoprim (BACTRIM DS,SEPTRA DS) 800-160 MG tablet TAKE 1 TABLET (160 MG OF  TRIMETHOPRIM TOTAL) BY MOUTH TWO (2) TIMES A DAY FOR 5 DAYS.     No current facility-administered medications on file prior to visit.      Family Hx: The patient's family history includes Alcohol abuse in her mother; Breast cancer in her paternal grandmother; COPD in her mother; Cancer in her father and paternal grandmother; Cancer (age of onset: 65) in her brother; Colon polyps in her mother; Diabetes in her father, paternal grandmother, and son; Drug abuse in her brother; Epilepsy in her son; Heart attack in her mother; Heart disease in her maternal grandfather, maternal grandmother, paternal grandfather, and paternal grandmother; Heart disease (age of onset: 72) in her mother; Heart disease (age of onset: 17) in her father; Hyperlipidemia in her brother, father, and mother; Hypertension in  her brother, father, maternal grandfather, maternal grandmother, and mother; Hypothyroidism in her brother; Mental illness in her brother; Stroke in her maternal grandfather, maternal grandmother, and paternal grandfather.  ROS:   Please see the history of present illness.    Review of Systems  Constitutional: Negative.   Respiratory: Negative.   Cardiovascular: Negative.   Gastrointestinal: Negative.   Musculoskeletal: Negative.   Neurological: Negative.   Psychiatric/Behavioral: The patient is nervous/anxious.   All other systems reviewed and are negative.     Labs/Other Tests and Data Reviewed:    Recent Labs: 03/13/2018: ALT 33; Hemoglobin 13.6; Platelets 239; TSH 1.510 05/19/2018: BUN 13; Creatinine, Ser 0.49; Potassium 4.0; Sodium 141   Recent Lipid Panel Lab Results  Component Value Date/Time   CHOL 188 03/13/2018 04:25 PM   TRIG 232 (H) 03/13/2018 04:25 PM   HDL 60 03/13/2018 04:25 PM   CHOLHDL 4.5 11/24/2014 03:10 PM   LDLCALC 82 03/13/2018 04:25 PM    Wt Readings from Last 3 Encounters:  11/21/18 159 lb (72.1 kg)  05/19/18 167 lb (75.8 kg)  04/16/18 171 lb (77.6 kg)     Exam:     Vital Signs: Vital signs may also be detailed in the HPI There were no vitals taken for this visit.  Wt Readings from Last 3 Encounters:  11/21/18 159 lb (72.1 kg)  05/19/18 167 lb (75.8 kg)  04/16/18 171 lb (77.6 kg)   Temp Readings from Last 3 Encounters:  05/19/18 (!) 97.3 F (36.3 C) (Oral)  04/16/18 98.1 F (36.7 C) (Oral)  03/13/18 98.2 F (36.8 C)   BP Readings from Last 3 Encounters:  11/21/18 118/79  05/19/18 118/80  04/16/18 125/84   Pulse Readings from Last 3 Encounters:  05/19/18 66  04/16/18 73  03/13/18 92     Well nourished, well developed female in no acute distress. Constitutional:  oriented to person, place, and time. No distress.     ASSESSMENT & PLAN:    Chest pain, unspecified type -  Prior history atypical chest pain Previous work-up unrevealing, does not have significant chest pain on exertion Occasionally has symptoms but does not feel this is cardiac No further ischemic work-up at this time  Mixed hyperlipidemia Dramatic improvement in her numbers on Crestor 10 mg daily Total cholesterol down to 180 Previously 260 two years ago She has also changed her diet  Paroxysmal tachycardia ([place]) Denies any recent symptoms concerning for paroxysmal tachycardia No further work-up at this time  Family history of premature coronary artery disease Prior work-up unrevealing Calcium score 0 with no aortic atherosclerosis on prior CT scan years ago Numbers now improved on Crestor   COVID-19 Education: The signs and symptoms of COVID-19 were discussed with the patient and how to seek care for testing (follow up with PCP or arrange E-visit).  The importance of social distancing was discussed today.  Patient Risk:   After full review of this patients clinical status, I feel that they are at least moderate risk at this time.  Time:   Today, I have spent 25 minutes with the patient with telehealth technology discussing the cardiac and medical  problems/diagnoses detailed above   10 min spent reviewing the chart prior to patient visit today   Medication Adjustments/Labs and Tests Ordered: Current medicines are reviewed at length with the patient today.  Concerns regarding medicines are outlined above.   Tests Ordered: No tests ordered   Medication Changes: No changes made   Disposition: Follow-up as needed  Signed, Ida Rogue, MD  12/22/2018 5:49 PM    [facility] [address], [place], [place] [address]

## 2018-12-23 ENCOUNTER — Other Ambulatory Visit: Payer: Self-pay

## 2018-12-23 ENCOUNTER — Telehealth (INDEPENDENT_AMBULATORY_CARE_PROVIDER_SITE_OTHER): Payer: BLUE CROSS/BLUE SHIELD | Admitting: Cardiovascular Disease

## 2018-12-23 DIAGNOSIS — E785 Hyperlipidemia, unspecified: Secondary | ICD-10-CM

## 2018-12-23 DIAGNOSIS — E782 Mixed hyperlipidemia: Secondary | ICD-10-CM

## 2018-12-23 DIAGNOSIS — R079 Chest pain, unspecified: Secondary | ICD-10-CM

## 2018-12-23 DIAGNOSIS — Z8249 Family history of ischemic heart disease and other diseases of the circulatory system: Secondary | ICD-10-CM | POA: Diagnosis not present

## 2018-12-23 DIAGNOSIS — I479 Paroxysmal tachycardia, unspecified: Secondary | ICD-10-CM | POA: Diagnosis not present

## 2018-12-23 NOTE — Patient Instructions (Addendum)
Medication Instructions:  No other medication changes  If you need a refill on your cardiac medications before your next appointment, please call your pharmacy.    Lab work: No new labs needed   If you have labs (blood work) drawn today and your tests are completely normal, you will receive your results only by: [name] MyChart Message (if you have MyChart) OR . A paper copy in the mail If you have any lab test that is abnormal or we need to change your treatment, we will call you to review the results.   Testing/Procedures: No new testing needed   Follow-Up: At [facility], you and your health needs are our priority.  As part of our continuing mission to provide you with exceptional heart care, we have created designated Provider Care Teams.  These Care Teams include your primary Cardiologist (physician) and Advanced Practice Providers (APPs -  Physician Assistants and Nurse Practitioners) who all work together to provide you with the care you need, when you need it.  . You will need a follow up appointment as needed  . Providers on your designated Care Team:   . Murray Hodgkins, NP . Christell Faith, [name] . Marrianne Mood, [name]  Any Other Special Instructions Will Be Listed Below (If Applicable).  For educational health videos Log in to : [email or web address] Or : [email or web address], password : [email or web address]

## 2018-12-23 NOTE — Telephone Encounter (Signed)
Called patient.  Made her aware that I needed to review consent with her before we could move on with the appointment.  Read consent to patient.  Patient verbally agreed.      Virtual Visit Pre-Appointment Phone Call  "(Name), I am calling you today to discuss your upcoming appointment. We are currently trying to limit exposure to the virus that causes COVID-19 by seeing patients at home rather than in the office."  1. "What is the BEST phone number to call the day of the visit?" - include this in appointment notes  2. "Do you have or have access to (through a family member/friend) a smartphone with video capability that we can use for your visit?" a. If yes - list this number in appt notes as "cell" (if different from BEST phone #) and list the appointment type as a VIDEO visit in appointment notes b. If no - list the appointment type as a PHONE visit in appointment notes  3. Confirm consent - "In the setting of the current Covid19 crisis, you are scheduled for a (phone or video) visit with your provider on (date) at (time).  Just as we do with many in-office visits, in order for you to participate in this visit, we must obtain consent.  If you'd like, I can send this to your mychart (if signed up) or email for you to review.  Otherwise, I can obtain your verbal consent now.  All virtual visits are billed to your insurance company just like a normal visit would be.  By agreeing to a virtual visit, we'd like you to understand that the technology does not allow for your provider to perform an examination, and thus may limit your provider's ability to fully assess your condition. If your provider identifies any concerns that need to be evaluated in person, we will make arrangements to do so.  Finally, though the technology is pretty good, we cannot assure that it will always work on either your or our end, and in the setting of a video visit, we may have to convert it to a phone-only visit.  In  either situation, we cannot ensure that we have a secure connection.  Are you willing to proceed?" STAFF: Did the patient verbally acknowledge consent to telehealth visit? Document YES/NO here: YES  4. Advise patient to be prepared - "Two hours prior to your appointment, go ahead and check your blood pressure, pulse, oxygen saturation, and your weight (if you have the equipment to check those) and write them all down. When your visit starts, your provider will ask you for this information. If you have an Apple Watch or Kardia device, please plan to have heart rate information ready on the day of your appointment. Please have a pen and paper handy nearby the day of the visit as well."  5. Give patient instructions for MyChart download to smartphone OR Doximity/Doxy.me as below if video visit (depending on what platform provider is using)  6. Inform patient they will receive a phone call 15 minutes prior to their appointment time (may be from unknown caller ID) so they should be prepared to answer    TELEPHONE CALL NOTE  Tracey Weaver has been deemed a candidate for a follow-up tele-health visit to limit community exposure during the Covid-19 pandemic. I spoke with the patient via phone to ensure availability of phone/video source, confirm preferred email & phone number, and discuss instructions and expectations.  I reminded Tracey Weaver to be prepared  with any vital sign and/or heart rhythm information that could potentially be obtained via home monitoring, at the time of her visit. I reminded Tracey Weaver to expect a phone call prior to her visit.  Tracey Weaver, [place] 12/23/2018 8:38 AM   INSTRUCTIONS FOR DOWNLOADING THE MYCHART APP TO SMARTPHONE  - The patient must first make sure to have activated MyChart and know their login information - If Apple, go to [employer] and type in MyChart in the search bar and download the app. If Android, ask patient to go to [employer] and type in  [place] in the search bar and download the app. The app is free but as with any other app downloads, their phone may require them to verify saved payment information or Apple/Android password.  - The patient will need to then log into the app with their MyChart username and password, and select [facility] as their healthcare provider to link the account. When it is time for your visit, go to the MyChart app, find appointments, and click Begin Video Visit. Be sure to Select Allow for your device to access the Microphone and Camera for your visit. You will then be connected, and your provider will be with you shortly.  **If they have any issues connecting, or need assistance please contact MyChart service desk [phone])**  **If using a computer, in order to ensure the best quality for their visit they will need to use either of the following Internet Browsers: [employer], or Google Chrome**  IF USING DOXIMITY or DOXY.ME - The patient will receive a link just prior to their visit by text.     FULL LENGTH CONSENT FOR TELE-HEALTH VISIT   I hereby voluntarily request, consent and authorize [facility] and its employed or contracted physicians, physician assistants, nurse practitioners or other licensed health care professionals (the Practitioner), to provide me with telemedicine health care services (the "Services") as deemed necessary by the treating Practitioner. I acknowledge and consent to receive the Services by the Practitioner via telemedicine. I understand that the telemedicine visit will involve communicating with the Practitioner through live audiovisual communication technology and the disclosure of certain medical information by electronic transmission. I acknowledge that I have been given the opportunity to request an in-person assessment or other available alternative prior to the telemedicine visit and am voluntarily participating in the telemedicine visit.  I  understand that I have the right to withhold or withdraw my consent to the use of telemedicine in the course of my care at any time, without affecting my right to future care or treatment, and that the Practitioner or I may terminate the telemedicine visit at any time. I understand that I have the right to inspect all information obtained and/or recorded in the course of the telemedicine visit and may receive copies of available information for a reasonable fee.  I understand that some of the potential risks of receiving the Services via telemedicine include:  [name] Delay or interruption in medical evaluation due to technological equipment failure or disruption; . Information transmitted may not be sufficient (e.g. poor resolution of images) to allow for appropriate medical decision making by the Practitioner; and/or  . In rare instances, security protocols could fail, causing a breach of personal health information.  Furthermore, I acknowledge that it is my responsibility to provide information about my medical history, conditions and care that is complete and accurate to the best of my ability. I acknowledge that Practitioner's advice,  recommendations, and/or decision may be based on factors not within their control, such as incomplete or inaccurate data provided by me or distortions of diagnostic images or specimens that may result from electronic transmissions. I understand that the practice of medicine is not an exact science and that Practitioner makes no warranties or guarantees regarding treatment outcomes. I acknowledge that I will receive a copy of this consent concurrently upon execution via email to the email address I last provided but may also request a printed copy by calling the office of [facility].    I understand that my insurance will be billed for this visit.   I have read or had this consent read to me. . I understand the contents of this consent, which adequately explains the  benefits and risks of the Services being provided via telemedicine.  . I have been provided ample opportunity to ask questions regarding this consent and the Services and have had my questions answered to my satisfaction. . I give my informed consent for the services to be provided through the use of telemedicine in my medical care  By participating in this telemedicine visit I agree to the above.

## 2019-01-09 ENCOUNTER — Telehealth: Payer: Self-pay | Admitting: Family Medicine

## 2019-01-09 DIAGNOSIS — E65 Localized adiposity: Secondary | ICD-10-CM

## 2019-01-09 NOTE — Telephone Encounter (Signed)
Copied from [place] [id number]. Topic: Referral - Request for Referral >> Jan 06, 2019 12:13 PM Rainey Pines A wrote: Has patient seen PCP for this complaint? Yes *If NO, is insurance requiring patient see PCP for this issue before PCP can refer them? Referral for which specialty: Cosmetic surgery Preferred provider/office: Dr. Bettina Gavia Stein/[phone] Reason for referral: Surgery on abdomen

## 2019-01-26 ENCOUNTER — Other Ambulatory Visit: Payer: Self-pay

## 2019-01-26 ENCOUNTER — Encounter: Payer: Self-pay | Admitting: Family Medicine

## 2019-01-26 ENCOUNTER — Ambulatory Visit (INDEPENDENT_AMBULATORY_CARE_PROVIDER_SITE_OTHER): Payer: BLUE CROSS/BLUE SHIELD | Admitting: Family Medicine

## 2019-01-26 ENCOUNTER — Ambulatory Visit: Payer: Self-pay

## 2019-01-26 DIAGNOSIS — R42 Dizziness and giddiness: Secondary | ICD-10-CM | POA: Diagnosis not present

## 2019-01-26 MED ORDER — MECLIZINE HCL 25 MG PO TABS: 25.0000 mg | ORAL_TABLET | Freq: Three times a day (TID) | ORAL | 0 refills | Status: DC | PRN

## 2019-01-26 MED ORDER — ONDANSETRON 4 MG PO TBDP: 4.0000 mg | ORAL_TABLET | Freq: Three times a day (TID) | ORAL | 0 refills | Status: DC | PRN

## 2019-01-26 NOTE — Telephone Encounter (Signed)
FYI. Scheduled today at [facility] with Apolonio Schneiders

## 2019-01-26 NOTE — Telephone Encounter (Signed)
Looks like you're seeing her, you OK doing this virtual or do you want one of [place] to see her in person?

## 2019-01-26 NOTE — Telephone Encounter (Signed)
Probably more thorough to do the visit in person but I could at least discuss with her and see if full workup in person is needed first

## 2019-01-26 NOTE — Progress Notes (Signed)
There were no vitals taken for this visit.   Subjective:    Patient ID: Tracey Weaver, female    DOB: 1966-01-14, 53 y.o.   MRN: [id number]  HPI: Tracey Weaver is a 53 y.o. female  Chief Complaint  Patient presents with  . Dizziness    Started yesterday, when she looks down or to the right.    . This visit was completed via WebEx due to the restrictions of the COVID-19 pandemic. All issues as above were discussed and addressed. Physical exam was done as above through visual confirmation on WebEx. If it was felt that the patient should be evaluated in the office, they were directed there. The patient verbally consented to this visit. . Location of the patient: home . Location of the provider: home . Those involved with this call:  . Provider: Merrie Roof, [name] . CMA: Tiffany Reel, CMA . Front Desk/Registration: Jill Side  . Time spent on call: 15 minutes with patient face to face via video conference. More than 50% of this time was spent in counseling and coordination of care. 5 minutes total spent in review of patient's record and preparation of their chart. I verified patient identity using two factors (patient name and date of birth). Patient consents verbally to being seen via telemedicine visit today.   Patient here today with 1 day of dizziness with turning her head downward or to the right, also with intermittent nausea. Sxs were worse yesterday, today have been just intermittent. Checked her BP and BS yesterday and both were WNL. Denies confusion, visual changes, falls, CP, SOB. Has not been trying anything for sxs and has never dealt with this before.   Relevant past medical, surgical, family and social history reviewed and updated as indicated. Interim medical history since our last visit reviewed. Allergies and medications reviewed and updated.  Review of Systems  Per HPI unless specifically indicated above     Objective:    There were no vitals taken for this  visit.  Wt Readings from Last 3 Encounters:  11/21/18 159 lb (72.1 kg)  05/19/18 167 lb (75.8 kg)  04/16/18 171 lb (77.6 kg)    Physical Exam Vitals signs and nursing note reviewed.  Constitutional:      General: She is not in acute distress.    Appearance: Normal appearance.  HENT:     Head: Atraumatic.     Right Ear: External ear normal.     Left Ear: External ear normal.     Nose: Nose normal. No congestion.     Mouth/Throat:     Mouth: Mucous membranes are moist.     Pharynx: Oropharynx is clear. No posterior oropharyngeal erythema.  Eyes:     Extraocular Movements: Extraocular movements intact.     Conjunctiva/sclera: Conjunctivae normal.  Neck:     Musculoskeletal: Normal range of motion.  Cardiovascular:     Comments: Unable to assess via virtual visit Pulmonary:     Effort: Pulmonary effort is normal. No respiratory distress.  Musculoskeletal: Normal range of motion.  Skin:    General: Skin is dry.     Findings: No erythema.  Neurological:     Mental Status: She is alert and oriented to person, place, and time.     Motor: No weakness.     Gait: Gait normal.     Comments: Unable to perform thorough neurologic exam given virtual nature of visit  Psychiatric:        Mood and  Affect: Mood normal.        Thought Content: Thought content normal.        Judgment: Judgment normal.     Results for orders placed or performed in visit on 88/32/54  Basic metabolic panel  Result Value Ref Range   Glucose 81 65 - 99 mg/dL   BUN 13 6 - 24 mg/dL   Creatinine, Ser 0.49 (L) 0.57 - 1.00 mg/dL   GFR calc non Af Amer 113 >59 mL/min/1.73   GFR calc Af Amer 130 >59 mL/min/1.73   BUN/Creatinine Ratio 27 (H) 9 - 23   Sodium 141 134 - 144 mmol/L   Potassium 4.0 3.5 - 5.2 mmol/L   Chloride 102 96 - 106 mmol/L   CO2 26 20 - 29 mmol/L   Calcium 9.5 8.7 - 10.2 mg/dL      Assessment & Plan:   Problem List Items Addressed This Visit    None    Visit Diagnoses    Dizziness     -  Primary   Suspect vertigo, tx with meclizine, epley maneuvers, rest. Declines nausea medication at this time. F/u if sxs worsening or not resolving       Follow up plan: Return if symptoms worsen or fail to improve.

## 2019-01-26 NOTE — Telephone Encounter (Signed)
Sounds good- if you need her to follow up here, I can see her tomorrow.

## 2019-01-26 NOTE — Telephone Encounter (Signed)
Patient called and says she's dizzy today that started on yesterday. She says yesterday was the worse with any movement and she was nauseous. She says she took a phenergan she had at home and went to sleep, no nausea today. She says today the dizziness is only when she turns her head or put her head down. No other symptoms. I advised the office is closed for lunch, so someone will call her back to schedule a virtual visit, she verbalized understanding.  Answer Assessment - Initial Assessment Questions 1. DESCRIPTION: "Describe your dizziness."     Not today, yesterday it was. 2. LIGHTHEADED: "Do you feel lightheaded?" (e.g., somewhat faint, woozy, weak upon standing)     Yes 3. VERTIGO: "Do you feel like either you or the room is spinning or tilting?" (i.e. vertigo)     Not today 4. SEVERITY: "How bad is it?"  "Do you feel like you are going to faint?" "Can you stand and walk?"   - MILD - walking normally   - MODERATE - interferes with normal activities (e.g., work, school)    - SEVERE - unable to stand, requires support to walk, feels like passing out now.      Moderate 5. ONSET:  "When did the dizziness begin?"     Yesterday 6. AGGRAVATING FACTORS: "Does anything make it worse?" (e.g., standing, change in head position)     Looking down, change in head position 7. HEART RATE: "Can you tell me your heart rate?" "How many beats in 15 seconds?"  (Note: not all patients can do this)       N/A 8. CAUSE: "What do you think is causing the dizziness?"     No idea 9. RECURRENT SYMPTOM: "Have you had dizziness before?" If so, ask: "When was the last time?" "What happened that time?"     No 10. OTHER SYMPTOMS: "Do you have any other symptoms?" (e.g., fever, chest pain, vomiting, diarrhea, bleeding)      Nauseated yesterday, no symptoms today 11. PREGNANCY: "Is there any chance you are pregnant?" "When was your last menstrual period?"       No  Protocols used: DIZZINESS [facility]

## 2019-02-25 DIAGNOSIS — E65 Localized adiposity: Secondary | ICD-10-CM | POA: Diagnosis not present

## 2019-02-25 DIAGNOSIS — M793 Panniculitis, unspecified: Secondary | ICD-10-CM | POA: Diagnosis not present

## 2019-02-25 DIAGNOSIS — R634 Abnormal weight loss: Secondary | ICD-10-CM | POA: Diagnosis not present

## 2019-03-01 DIAGNOSIS — Z20828 Contact with and (suspected) exposure to other viral communicable diseases: Secondary | ICD-10-CM | POA: Diagnosis not present

## 2019-03-10 ENCOUNTER — Other Ambulatory Visit: Payer: Self-pay

## 2019-03-10 ENCOUNTER — Ambulatory Visit (INDEPENDENT_AMBULATORY_CARE_PROVIDER_SITE_OTHER): Payer: BC Managed Care – PPO | Admitting: Nurse Practitioner

## 2019-03-10 ENCOUNTER — Encounter: Payer: Self-pay | Admitting: Nurse Practitioner

## 2019-03-10 DIAGNOSIS — B354 Tinea corporis: Secondary | ICD-10-CM | POA: Insufficient documentation

## 2019-03-10 DIAGNOSIS — K5900 Constipation, unspecified: Secondary | ICD-10-CM | POA: Insufficient documentation

## 2019-03-10 MED ORDER — NYSTATIN 100000 UNIT/GM EX POWD: Freq: Three times a day (TID) | CUTANEOUS | 1 refills | Status: DC

## 2019-03-10 NOTE — Progress Notes (Signed)
BP (!) 139/93   Pulse 61   Temp 98 F (36.7 C) (Oral)   Wt 148 lb (67.1 kg)   SpO2 99%   BMI 24.93 kg/m    Subjective:    Patient ID: Tracey Weaver, female    DOB: April 06, 1966, 53 y.o.   MRN: [id number]  HPI: Tracey Weaver is a 53 y.o. female  Chief Complaint  Patient presents with  . Skin Problem    Pt recently lost ~30 lbs and is having issus w/ skin rubbing and causing painful breakdown   RASH Has lost 30 pounds recently and reports current skin irritation from rubbing/chafing. Reports to area under skin folds abdomen.  Has had similar presentation before, but this is getting worse with weight loss.  In past has used Neosporin. Duration:  months  Location: trunk  Itching: no Burning: yes Redness: yes Oozing: no Scaling: no Blisters: no Painful: yes Fevers: no Change in detergents/soaps/personal care products: no Recent illness: no Recent travel:no History of same: yes Context: stable Alleviating factors: nothing Treatments attempted:nothing Shortness of breath: no  Throat/tongue swelling: no Myalgias/arthralgias: no  Relevant past medical, surgical, family and social history reviewed and updated as indicated. Interim medical history since our last visit reviewed. Allergies and medications reviewed and updated.  Review of Systems  Constitutional: Negative for activity change, appetite change, diaphoresis, fatigue and fever.  Respiratory: Negative for cough, chest tightness and shortness of breath.   Cardiovascular: Negative for chest pain, palpitations and leg swelling.  Gastrointestinal: Negative for abdominal distention, abdominal pain, constipation, diarrhea, nausea and vomiting.  Skin: Positive for rash.  Neurological: Negative for dizziness, syncope, weakness, light-headedness, numbness and headaches.  Psychiatric/Behavioral: Negative.     Per HPI unless specifically indicated above     Objective:    BP (!) 139/93   Pulse 61   Temp 98 F (36.7  C) (Oral)   Wt 148 lb (67.1 kg)   SpO2 99%   BMI 24.93 kg/m   Wt Readings from Last 3 Encounters:  03/10/19 148 lb (67.1 kg)  11/21/18 159 lb (72.1 kg)  05/19/18 167 lb (75.8 kg)    Physical Exam Vitals signs and nursing note reviewed.  Constitutional:      General: She is awake. She is not in acute distress.    Appearance: She is well-developed. She is not ill-appearing.  HENT:     Head: Normocephalic.     Right Ear: Hearing normal.     Left Ear: Hearing normal.     Nose: Nose normal.     Mouth/Throat:     Mouth: Mucous membranes are moist.  Eyes:     General: Lids are normal.        Right eye: No discharge.        Left eye: No discharge.     Conjunctiva/sclera: Conjunctivae normal.     Pupils: Pupils are equal, round, and reactive to light.  Neck:     Musculoskeletal: Normal range of motion and neck supple.     Thyroid: No thyromegaly.     Vascular: No carotid bruit or JVD.  Cardiovascular:     Rate and Rhythm: Normal rate and regular rhythm.     Heart sounds: Normal heart sounds. No murmur. No gallop.   Pulmonary:     Effort: Pulmonary effort is normal. No accessory muscle usage or respiratory distress.     Breath sounds: Normal breath sounds.  Abdominal:     General: Bowel sounds are normal.  Palpations: Abdomen is soft. There is no hepatomegaly or splenomegaly.  Musculoskeletal:     Right lower leg: No edema.     Left lower leg: No edema.  Lymphadenopathy:     Cervical: No cervical adenopathy.  Skin:    General: Skin is warm and dry.     Findings: Rash present.     Comments: Small line of mild erythema under abdominal skin fold with no drainage or blisters.  Small line of mild erythema noted under bilateral breast with no drainage or odor.  Neurological:     Mental Status: She is alert and oriented to person, place, and time.  Psychiatric:        Attention and Perception: Attention normal.        Mood and Affect: Mood normal.        Behavior: Behavior  normal. Behavior is cooperative.        Thought Content: Thought content normal.        Judgment: Judgment normal.     Results for orders placed or performed in visit on 09/81/19  Basic metabolic panel  Result Value Ref Range   Glucose 81 65 - 99 mg/dL   BUN 13 6 - 24 mg/dL   Creatinine, Ser 0.49 (L) 0.57 - 1.00 mg/dL   GFR calc non Af Amer 113 >59 mL/min/1.73   GFR calc Af Amer 130 >59 mL/min/1.73   BUN/Creatinine Ratio 27 (H) 9 - 23   Sodium 141 134 - 144 mmol/L   Potassium 4.0 3.5 - 5.2 mmol/L   Chloride 102 96 - 106 mmol/L   CO2 26 20 - 29 mmol/L   Calcium 9.5 8.7 - 10.2 mg/dL      Assessment & Plan:   Problem List Items Addressed This Visit      Musculoskeletal and Integument   Tinea corporis    Under panus and breasts.  Nystatin powder ordered.  Recommend use of Body Glide prior to exercising to prevent skin irritation and breakdown + ensuring area Is dry after showering and exercise regimen.  Return for worsening or continued issues..      Relevant Medications   nystatin (MYCOSTATIN/NYSTOP) powder       Follow up plan: Return if symptoms worsen or fail to improve.

## 2019-03-10 NOTE — Patient Instructions (Signed)
bodyglide for prevention   Skin Yeast Infection  A skin yeast infection is a condition in which there is an overgrowth of yeast (candida) that normally lives on the skin. This condition usually occurs in areas of the skin that are constantly warm and moist, such as the armpits or the groin. What are the causes? This condition is caused by a change in the normal balance of the yeast and bacteria that live on the skin. What increases the risk? You are more likely to develop this condition if you:  Are obese.  Are pregnant.  Take birth control pills.  Have diabetes.  Take antibiotic medicines.  Take steroid medicines.  Are malnourished.  Have a weak body defense system (immune system).  Are 87 years of age or older.  Wear tight clothing. What are the signs or symptoms? The most common symptom of this condition is itchiness in the affected area. Other symptoms include:  Red, swollen area of the skin.  Bumps on the skin. How is this diagnosed?  This condition is diagnosed with a medical history and physical exam.  Your health care provider may check for yeast by taking light scrapings of the skin to be viewed under a microscope. How is this treated? This condition is treated with medicine. Medicines may be prescribed or be available over the counter. The medicines may be:  Taken by mouth (orally).  Applied as a cream or powder to your skin. Follow these instructions at home:   Take or apply over-the-counter and prescription medicines only as told by your health care provider.  Maintain a healthy weight. If you need help losing weight, talk with your health care provider.  Keep your skin clean and dry.  If you have diabetes, keep your blood sugar under control.  Keep all follow-up visits as told by your health care provider. This is important. Contact a health care provider if:  Your symptoms go away and then return.  Your symptoms do not get better with  treatment.  Your symptoms get worse.  Your rash spreads.  You have a fever or chills.  You have new symptoms.  You have new warmth or redness of your skin. Summary  A skin yeast infection is a condition in which there is an overgrowth of yeast (candida) that normally lives on the skin. This condition is caused by a change in the normal balance of the yeast and bacteria that live on the skin.  Take or apply over-the-counter and prescription medicines only as told by your health care provider.  Keep your skin clean and dry.  Contact a health care provider if your symptoms do not get better with treatment. This information is not intended to replace advice given to you by your health care provider. Make sure you discuss any questions you have with your health care provider. Document Released: 05/01/2011 Document Revised: 12/31/2017 Document Reviewed: 12/31/2017 Elsevier Patient Education  2020 [employer].

## 2019-03-10 NOTE — Assessment & Plan Note (Signed)
Under panus and breasts.  Nystatin powder ordered.  Recommend use of Body Glide prior to exercising to prevent skin irritation and breakdown + ensuring area Is dry after showering and exercise regimen.  Return for worsening or continued issues.[name]

## 2019-03-26 ENCOUNTER — Other Ambulatory Visit: Payer: Self-pay | Admitting: Family Medicine

## 2019-03-26 DIAGNOSIS — Z1231 Encounter for screening mammogram for malignant neoplasm of breast: Secondary | ICD-10-CM

## 2019-04-20 DIAGNOSIS — R634 Abnormal weight loss: Secondary | ICD-10-CM | POA: Diagnosis not present

## 2019-04-20 DIAGNOSIS — E65 Localized adiposity: Secondary | ICD-10-CM | POA: Diagnosis not present

## 2019-04-20 DIAGNOSIS — N6481 Ptosis of breast: Secondary | ICD-10-CM | POA: Diagnosis not present

## 2019-04-20 DIAGNOSIS — M793 Panniculitis, unspecified: Secondary | ICD-10-CM | POA: Diagnosis not present

## 2019-05-11 ENCOUNTER — Ambulatory Visit
Admission: RE | Admit: 2019-05-11 | Discharge: 2019-05-11 | Disposition: A | Discharge status: Home / Self Care | Payer: BC Managed Care – PPO | Source: Ambulatory Visit | Attending: Family Medicine | Admitting: Family Medicine

## 2019-05-11 DIAGNOSIS — Z1231 Encounter for screening mammogram for malignant neoplasm of breast: Secondary | ICD-10-CM | POA: Diagnosis not present

## 2019-05-25 ENCOUNTER — Other Ambulatory Visit: Payer: Self-pay | Admitting: Family Medicine

## 2019-05-25 DIAGNOSIS — M2550 Pain in unspecified joint: Secondary | ICD-10-CM

## 2019-05-25 DIAGNOSIS — M793 Panniculitis, unspecified: Secondary | ICD-10-CM | POA: Diagnosis not present

## 2019-05-25 DIAGNOSIS — R634 Abnormal weight loss: Secondary | ICD-10-CM | POA: Diagnosis not present

## 2019-05-25 NOTE — Telephone Encounter (Signed)
Forwarding medication refill requests to PCP for review. 

## 2019-06-29 DIAGNOSIS — R634 Abnormal weight loss: Secondary | ICD-10-CM | POA: Diagnosis not present

## 2019-06-29 DIAGNOSIS — Z01812 Encounter for preprocedural laboratory examination: Secondary | ICD-10-CM | POA: Diagnosis not present

## 2019-06-29 DIAGNOSIS — M793 Panniculitis, unspecified: Secondary | ICD-10-CM | POA: Diagnosis not present

## 2019-06-29 DIAGNOSIS — Z01810 Encounter for preprocedural cardiovascular examination: Secondary | ICD-10-CM | POA: Diagnosis not present

## 2019-06-29 DIAGNOSIS — Z20828 Contact with and (suspected) exposure to other viral communicable diseases: Secondary | ICD-10-CM | POA: Diagnosis not present

## 2019-07-02 DIAGNOSIS — R21 Rash and other nonspecific skin eruption: Secondary | ICD-10-CM | POA: Diagnosis not present

## 2019-07-02 DIAGNOSIS — M793 Panniculitis, unspecified: Secondary | ICD-10-CM | POA: Diagnosis not present

## 2019-07-02 DIAGNOSIS — E65 Localized adiposity: Secondary | ICD-10-CM | POA: Diagnosis not present

## 2019-07-02 DIAGNOSIS — R634 Abnormal weight loss: Secondary | ICD-10-CM | POA: Diagnosis not present

## 2019-07-03 DIAGNOSIS — R634 Abnormal weight loss: Secondary | ICD-10-CM | POA: Diagnosis not present

## 2019-07-03 DIAGNOSIS — R21 Rash and other nonspecific skin eruption: Secondary | ICD-10-CM | POA: Diagnosis not present

## 2019-07-03 DIAGNOSIS — M793 Panniculitis, unspecified: Secondary | ICD-10-CM | POA: Diagnosis not present

## 2019-08-05 ENCOUNTER — Other Ambulatory Visit: Payer: Self-pay | Admitting: Family Medicine

## 2019-08-05 NOTE — Telephone Encounter (Signed)
Requested medication (s) are due for refill today: yes  Requested medication (s) are on the active medication list:yes  Last refill:  12/10/2018  Future visit scheduled: yes  Notes to clinic:  Review for refill   Requested Prescriptions  Pending Prescriptions Disp Refills   estrogens, conjugated, (PREMARIN) 0.625 MG tablet 84 tablet 1    Sig: TAKE 1 TABLET (0.625 MG TOTAL) BY MOUTH DAILY. TAKE DAILY FOR 21 DAYS THEN DO NOT TAKE FOR 7 DAYS.     OB/GYN:  Estrogens Failed - 08/05/2019  2:25 PM      Failed - Last BP in normal range    BP Readings from Last 1 Encounters:  03/10/19 (!) 139/93         Passed - Mammogram is up-to-date per Health Maintenance      Passed - Valid encounter within last 12 months    Recent Outpatient Visits          4 months ago Tinea corporis   [name], [name], NP   6 months ago Dizziness   [name], [name], [name]   8 months ago Essential hypertension   [place], [place], DO   1 year ago Hypertension, unspecified type   [name], [name], DO   1 year ago Hypertension, unspecified type   [facility] Trinna Post, [name]      Future Appointments            In 1 month Wynetta Emery, Barb Merino, DO [name], [name]

## 2019-08-05 NOTE — Telephone Encounter (Signed)
Pt request refill   PREMARIN 0.625 MG tablet   [place], [place] - [address] [phone] (Phone) [phone] (Fax)

## 2019-08-07 MED ORDER — ESTROGENS CONJUGATED 0.625 MG PO TABS: ORAL_TABLET | ORAL | 1 refills | Status: DC

## 2019-09-21 ENCOUNTER — Ambulatory Visit (INDEPENDENT_AMBULATORY_CARE_PROVIDER_SITE_OTHER): Payer: BC Managed Care – PPO | Admitting: Family Medicine

## 2019-09-21 ENCOUNTER — Other Ambulatory Visit: Payer: Self-pay

## 2019-09-21 ENCOUNTER — Encounter: Payer: Self-pay | Admitting: Family Medicine

## 2019-09-21 VITALS — BP 135/87 | HR 66 | Temp 98.2°F | Ht 65.0 in | Wt 152.0 lb

## 2019-09-21 DIAGNOSIS — N3010 Interstitial cystitis (chronic) without hematuria: Secondary | ICD-10-CM

## 2019-09-21 DIAGNOSIS — K219 Gastro-esophageal reflux disease without esophagitis: Secondary | ICD-10-CM

## 2019-09-21 DIAGNOSIS — I479 Paroxysmal tachycardia, unspecified: Secondary | ICD-10-CM

## 2019-09-21 DIAGNOSIS — R202 Paresthesia of skin: Secondary | ICD-10-CM

## 2019-09-21 DIAGNOSIS — E782 Mixed hyperlipidemia: Secondary | ICD-10-CM

## 2019-09-21 DIAGNOSIS — I10 Essential (primary) hypertension: Secondary | ICD-10-CM | POA: Diagnosis not present

## 2019-09-21 DIAGNOSIS — Z1211 Encounter for screening for malignant neoplasm of colon: Secondary | ICD-10-CM

## 2019-09-21 DIAGNOSIS — Z0000 Encounter for general adult medical examination without abnormal findings: Secondary | ICD-10-CM | POA: Diagnosis not present

## 2019-09-21 DIAGNOSIS — Z1231 Encounter for screening mammogram for malignant neoplasm of breast: Secondary | ICD-10-CM

## 2019-09-21 DIAGNOSIS — M2550 Pain in unspecified joint: Secondary | ICD-10-CM

## 2019-09-21 DIAGNOSIS — Z862 Personal history of diseases of the blood and blood-forming organs and certain disorders involving the immune mechanism: Secondary | ICD-10-CM | POA: Diagnosis not present

## 2019-09-21 LAB — MICROALBUMIN, URINE WAIVED
Creatinine, Urine Waived: 100 mg/dL (ref 10–300)
Microalb, Ur Waived: 10 mg/L (ref 0–19)
Microalb/Creat Ratio: <30 mg/g (ref <30)

## 2019-09-21 LAB — UA/M W/RFLX CULTURE, ROUTINE
Bilirubin, UA: NEGATIVE
Glucose, UA: NEGATIVE
Ketones, UA: NEGATIVE
Leukocytes,UA: NEGATIVE
Nitrite, UA: NEGATIVE
Protein,UA: NEGATIVE
RBC, UA: NEGATIVE
Specific Gravity, UA: 1.025 (ref 1.005–1.030)
Urobilinogen, Ur: 0.2 mg/dL (ref 0.2–1.0)
pH, UA: 7 (ref 5.0–7.5)

## 2019-09-21 MED ORDER — ESTROGENS CONJUGATED 0.625 MG PO TABS: ORAL_TABLET | ORAL | 2 refills | Status: DC

## 2019-09-21 MED ORDER — GABAPENTIN 100 MG PO CAPS: 100.0000 mg | ORAL_CAPSULE | Freq: Every day | ORAL | 1 refills | Status: DC

## 2019-09-21 MED ORDER — DICLOFENAC SODIUM 75 MG PO TBEC: 75.0000 mg | DELAYED_RELEASE_TABLET | Freq: Two times a day (BID) | ORAL | 1 refills | Status: DC | PRN

## 2019-09-21 NOTE — Progress Notes (Addendum)
BP 135/87   Pulse 66   Temp 98.2 F (36.8 C) (Oral)   Ht 5\' 5"  (1.651 m)   Wt 152 lb (68.9 kg)   SpO2 97%   BMI 25.29 kg/m    Subjective:    Patient ID: Tracey Weaver, female    DOB: [birth year], 54 y.o.   MRN: [id number]  HPI: Tracey Weaver is a 54 y.o. female presenting on 09/21/2019 for comprehensive medical examination. Current medical complaints include:  HYPERTENSION / HYPERLIPIDEMIA Satisfied with current treatment? yes Duration of hypertension: chronic BP monitoring frequency: not checking BP medication side effects: not on anything Duration of hyperlipidemia: chronic Cholesterol medication side effects:  not on anything Cholesterol supplements: none Past cholesterol medications: none Medication compliance: not on anything Aspirin: no Recent stressors: yes Recurrent headaches: no Visual changes: no Palpitations: no Dyspnea: no Chest pain: no Lower extremity edema: no Dizzy/lightheaded: no  Menopausal Symptoms: no  Depression Screen done today and results listed below:  Depression screen [facility] 2/9 09/21/2019 11/21/2018 03/13/2018 11/13/2016 07/30/2016  Decreased Interest 1 0 0 0 0  Down, Depressed, Hopeless 1 0 0 0 0  PHQ - 2 Score 2 0 0 0 0  Altered sleeping 0 - - - -  Tired, decreased energy 1 - - - -  Change in appetite 0 - - - -  Feeling bad or failure about yourself  1 - - - -  Trouble concentrating 2 - - - -  Moving slowly or fidgety/restless 1 - - - -  Suicidal thoughts 0 - - - -  PHQ-9 Score 7 - - - -  Difficult doing work/chores Not difficult at all - - - -   GAD 7 : Generalized Anxiety Score 09/21/2019  Nervous, Anxious, on Edge 0  Control/stop worrying 1  Worry too much - different things 1  Trouble relaxing 1  Restless 0  Easily annoyed or irritable 2  Afraid - awful might happen 1  Total GAD 7 Score 6  Anxiety Difficulty Somewhat difficult      Past Medical History:  Past Medical History:  Diagnosis Date  . Adjustment disorder with  depressed mood   . Anxiety   . Cervical cancer (HCC)    precancerous cells  . Chest pain   . Constipation   . Degenerative disc disease, cervical   . Dysplastic nevus 08/27/2013   s/p resection by Dr. Aubery Lapping.  . Dysthymic disorder   . History of ectopic pregnancy    Multiple  . IBS (irritable bowel syndrome)    Constipation; Rx for Amitiza worked well.  . Interstitial cystitis    [facility]/Urology; s/p bladder biopsy; s/p cystoscopy x 3 in 2015.  [name] Kidney stones   . Liver function study, abnormal   . Pneumonia   . Pyelonephritis   . Recurrent UTI   . Splenomegaly    s/p GI consult and hematology consult  . Ulcer 09/28/2011   EGD: +gastric ulcer; rx for Dexilant.  [name].    Surgical History:  Past Surgical History:  Procedure Laterality Date  . ABDOMINAL EXPLORATION SURGERY  1997   ruptured tube from eptopic pregnancy  . ABDOMINAL HYSTERECTOMY  08/27/1998   cervical dysplasia;one ovary remaining  . CESAREAN SECTION     x 2.  . COLONOSCOPY  08/27/2010   [name].    . CYSTOSTOMY W/ BLADDER BIOPSY  05/27/2014   [name]. Benign.  . ESOPHAGOGASTRODUODENOSCOPY  09/28/2011   +gastric ulcer.  [name].  . TONSILLECTOMY  05/27/2014  [place].    Medications:  Current Outpatient Medications on File Prior to Visit  Medication Sig  . Black Elderberry,Berry-Flower, 575 MG CAPS Take by mouth daily.  . CASCARA SAGRADA PO Take by mouth.  . Cyanocobalamin 2500 MCG SUBL Place under the tongue daily.   . cyclobenzaprine (FLEXERIL) 5 MG tablet Take by mouth as needed.   . meclizine (ANTIVERT) 25 MG tablet Take 1 tablet (25 mg total) by mouth 3 (three) times daily as needed for dizziness.  . Multiple Vitamin (MULTIVITAMIN) capsule Take by mouth daily.   [name] RETIN-A MICRO PUMP 0.06 % GEL APPLY 1 APPLICATION ON THE SKIN AT NIGHT/BEDTIME  . VITAMIN D PO Take by mouth daily. Gummies   No current facility-administered medications on file prior to visit.    Allergies:  Allergies  Allergen  Reactions  . Meloxicam Other (See Comments)    "MAKES ME FEEL FUNNY", Dizziness    Social History:  Social History   Socioeconomic History  . Marital status: Married    Spouse name: Not on file  . Number of children: 2  . Years of education: 34  . Highest education level: Not on file  Occupational History  . Occupation: Oncologist: self employed  Tobacco Use  . Smoking status: Never Smoker  . Smokeless tobacco: Never Used  Substance and Sexual Activity  . Alcohol use: Yes    Alcohol/week: 0.0 standard drinks    Comment: 1-2 drinks per month  . Drug use: No  . Sexual activity: Yes    Partners: Male    Birth control/protection: Surgical  Other Topics Concern  . 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      Tobacco:  None      Alcohol: 1-2 servings per month.      Drugs:  none      Exercise:  none      Caffeine use: Coffee, 1 serving per day   Always uses seat belts, smoke alarm and carbon monoxide detector in the home, Guns locked up.   Organ Donor: YES   Patient DOES not have living will, DOES not have HCPOA.         Social Determinants of Health   Financial Resource Strain:   . Difficulty of Paying Living Expenses: Not on file  Food Insecurity:   . Worried About Charity fundraiser in the Last Year: Not on file  . Ran Out of Food in the Last Year: Not on file  Transportation Needs:   . Lack of Transportation (Medical): Not on file  . Lack of Transportation (Non-Medical): Not on file  Physical Activity:   . Days of Exercise per Week: Not on file  . Minutes of Exercise per Session: Not on file  Stress:   . Feeling of Stress : Not on file  Social Connections:   . Frequency of Communication with Friends and Family: Not on file  . Frequency of Social Gatherings  with Friends and Family: Not on file  . Attends Religious Services: Not on file  . Active Member of Clubs or Organizations: Not on file  . Attends Archivist Meetings: Not on file  . Marital Status: Not on file  Intimate Partner  Violence:   . Fear of Current or Ex-Partner: Not on file  . Emotionally Abused: Not on file  . Physically Abused: Not on file  . Sexually Abused: Not on file   Social History   Tobacco Use  Smoking Status Never Smoker  Smokeless Tobacco Never Used   Social History   Substance and Sexual Activity  Alcohol Use Yes  . Alcohol/week: 0.0 standard drinks   Comment: 1-2 drinks per month    Family History:  Family History  Problem Relation Age of Onset  . Hypertension Mother   . COPD Mother   . Hyperlipidemia Mother   . Alcohol abuse Mother   . Colon polyps Mother   . Heart attack Mother   . Heart disease Mother 15       AMI x 2; stents.  PAD s/p stenting.  . Cancer Father        prostate cancer with mets  . Diabetes Father   . Heart disease Father 40       AMI age 50  . Hyperlipidemia Father   . Hypertension Father   . Mental illness Brother        paranoid schizophrenia  . Hypothyroidism Brother   . Hyperlipidemia Brother   . Stroke Maternal Grandmother   . Heart disease Maternal Grandmother   . Hypertension Maternal Grandmother   . Stroke Maternal Grandfather   . Heart disease Maternal Grandfather   . Hypertension Maternal Grandfather   . Cancer Paternal Grandmother   . Heart disease Paternal Grandmother   . Diabetes Paternal Grandmother   . Breast cancer Paternal Grandmother   . Stroke Paternal Grandfather   . Heart disease Paternal Grandfather   . Cancer Brother 40       lung, germ cell; lobectomy at 36  . Drug abuse Brother   . Hypertension Brother   . Epilepsy Son   . Diabetes Son     Past medical history, surgical history, medications, allergies, family history and social history reviewed with patient today and  changes made to appropriate areas of the chart.   Review of Systems  Constitutional: Negative.   HENT: Negative.   Eyes: Negative.   Respiratory: Negative.   Cardiovascular: Negative.   Gastrointestinal: Negative.   Genitourinary: Negative.   Musculoskeletal: Negative.   Skin: Negative.   Neurological: Positive for dizziness and tingling (in fingers and toes). Negative for tremors, sensory change, speech change, focal weakness, seizures, loss of consciousness, weakness and headaches.  Endo/Heme/Allergies: Negative.   Psychiatric/Behavioral: Positive for depression. Negative for hallucinations, memory loss, substance abuse and suicidal ideas. The patient is not nervous/anxious and does not have insomnia.     All other ROS negative except what is listed above and in the HPI.      Objective:    BP 135/87   Pulse 66   Temp 98.2 F (36.8 C) (Oral)   Ht 5\' 5"  (1.651 m)   Wt 152 lb (68.9 kg)   SpO2 97%   BMI 25.29 kg/m   Wt Readings from Last 3 Encounters:  09/21/19 152 lb (68.9 kg)  03/10/19 148 lb (67.1 kg)  11/21/18 159 lb (72.1 kg)    Physical Exam Vitals and nursing note reviewed.  Constitutional:      General: She is not in acute distress.    Appearance: Normal appearance. She is not ill-appearing, toxic-appearing or diaphoretic.  HENT:     Head: Normocephalic and atraumatic.     Right Ear: Tympanic membrane, ear  canal and external ear normal. There is no impacted cerumen.     Left Ear: Tympanic membrane, ear canal and external ear normal. There is no impacted cerumen.     Nose: Nose normal. No congestion or rhinorrhea.     Mouth/Throat:     Mouth: Mucous membranes are moist.     Pharynx: Oropharynx is clear. No oropharyngeal exudate or posterior oropharyngeal erythema.  Eyes:     General: No scleral icterus.       Right eye: No discharge.        Left eye: No discharge.     Extraocular Movements: Extraocular movements intact.     Conjunctiva/sclera: Conjunctivae  normal.     Pupils: Pupils are equal, round, and reactive to light.  Neck:     Vascular: No carotid bruit.  Cardiovascular:     Rate and Rhythm: Normal rate and regular rhythm.     Pulses: Normal pulses.     Heart sounds: No murmur. No friction rub. No gallop.   Pulmonary:     Effort: Pulmonary effort is normal. No respiratory distress.     Breath sounds: Normal breath sounds. No stridor. No wheezing, rhonchi or rales.  Chest:     Chest wall: No tenderness.  Abdominal:     General: Abdomen is flat. Bowel sounds are normal. There is no distension.     Palpations: Abdomen is soft. There is no mass.     Tenderness: There is no abdominal tenderness. There is no right CVA tenderness, left CVA tenderness, guarding or rebound.     Hernia: No hernia is present.  Genitourinary:    Comments: Breast and pelvic exams deferred with shared decision making Musculoskeletal:        General: No swelling, tenderness, deformity or signs of injury.     Cervical back: Normal range of motion and neck supple. No rigidity. No muscular tenderness.     Right lower leg: No edema.     Left lower leg: No edema.  Lymphadenopathy:     Cervical: No cervical adenopathy.  Skin:    General: Skin is warm and dry.     Capillary Refill: Capillary refill takes less than 2 seconds.     Coloration: Skin is not jaundiced or pale.     Findings: No bruising, erythema, lesion or rash.  Neurological:     General: No focal deficit present.     Mental Status: She is alert and oriented to person, place, and time. Mental status is at baseline.     Cranial Nerves: No cranial nerve deficit.     Sensory: No sensory deficit.     Motor: No weakness.     Coordination: Coordination normal.     Gait: Gait normal.     Deep Tendon Reflexes: Reflexes normal.  Psychiatric:        Mood and Affect: Mood normal.        Behavior: Behavior normal.        Thought Content: Thought content normal.        Judgment: Judgment normal.      Results for orders placed or performed in visit on [id number]  Basic metabolic panel  Result Value Ref Range   Glucose 81 65 - 99 mg/dL   BUN 13 6 - 24 mg/dL   Creatinine, Ser 0.49 (L) 0.57 - 1.00 mg/dL   GFR calc non Af Amer 113 >59 mL/min/1.73   GFR calc Af Amer 130 >59 mL/min/1.73   BUN/Creatinine Ratio 27 (  H) 9 - 23   Sodium 141 134 - 144 mmol/L   Potassium 4.0 3.5 - 5.2 mmol/L   Chloride 102 96 - 106 mmol/L   CO2 26 20 - 29 mmol/L   Calcium 9.5 8.7 - 10.2 mg/dL      Assessment & Plan:   Problem List Items Addressed This Visit      Cardiovascular and Mediastinum   Paroxysmal tachycardia (HCC)    HR under good control. Continue to avoid caffeine. Call with any concerns.       Hypertension    Under good control off medicine. Continue to monitor. Call with any concerns.       Relevant Orders   Comprehensive metabolic panel   Microalbumin, Urine Waived   TSH     Digestive   GERD (gastroesophageal reflux disease)    Under good control on current regimen. Continue current regimen. Continue to monitor. Call with any concerns. Refills given. Labs drawn today.       Relevant Orders   CBC with Differential/Platelet   Comprehensive metabolic panel     Genitourinary   IC (interstitial cystitis)    Under good control on current regimen. Continue current regimen. Continue to monitor. Call with any concerns. Refills given. Labs drawn today.       Relevant Orders   Comprehensive metabolic panel   UA/M w/rflx Culture, Routine     Other   Mixed hyperlipidemia    Under good control off medicine. Continue to monitor. Call with any concerns.       Relevant Orders   Comprehensive metabolic panel   Lipid Panel w/o Chol/HDL Ratio    Other Visit Diagnoses    Routine general medical examination at a health care facility    -  Primary   Vaccines up to date. Screening labs checked today. Mammo due in March. Pap due in July. Colonoscopy due. Continue diet and exercise.  Call with any concerns.    Colon cancer screening       Referral placed today.   Relevant Orders   Ambulatory referral to Gastroenterology   Paresthesias       Labs drawn today. Await results.    Relevant Orders   B12 and Folate Panel   Multiple joint pain       Doing well with gabapentin and voltaren. Refills given today.   Relevant Medications   diclofenac (VOLTAREN) 75 MG EC tablet   Encounter for screening mammogram for malignant neoplasm of breast       Mammo due in March- ordered today.   Relevant Orders   MM 3D SCREEN BREAST BILATERAL       Follow up plan: Return in about 6 months (around 03/20/2020) for 6 month follow up with PAP.   LABORATORY TESTING:  - Pap smear: up to date  IMMUNIZATIONS:   - Tdap: Tetanus vaccination status reviewed: last tetanus booster within 10 years. - Influenza: Refused - Pneumovax: Not applicable   SCREENING: -Mammogram: Ordered today  - Colonoscopy: Ordered today   PATIENT COUNSELING:   Advised to take 1 mg of folate supplement per day if capable of pregnancy.   Sexuality: Discussed sexually transmitted diseases, partner selection, use of condoms, avoidance of unintended pregnancy  and contraceptive alternatives.   Advised to avoid cigarette smoking.  I discussed with the patient that most people either abstain from alcohol or drink within safe limits (<=14/week and <=4 drinks/occasion for males, <=7/weeks and <= 3 drinks/occasion for females) and that the risk  for alcohol disorders and other health effects rises proportionally with the number of drinks per week and how often a drinker exceeds daily limits.  Discussed cessation/primary prevention of drug use and availability of treatment for abuse.   Diet: Encouraged to adjust caloric intake to maintain  or achieve ideal body weight, to reduce intake of dietary saturated fat and total fat, to limit sodium intake by avoiding high sodium foods and not adding table salt, and to maintain  adequate dietary potassium and calcium preferably from fresh fruits, vegetables, and low-fat dairy products.    stressed the importance of regular exercise  Injury prevention: Discussed safety belts, safety helmets, smoke detector, smoking near bedding or upholstery.   Dental health: Discussed importance of regular tooth brushing, flossing, and dental visits.    NEXT PREVENTATIVE PHYSICAL DUE IN 1 YEAR. Return in about 6 months (around 03/20/2020) for 6 month follow up with PAP.

## 2019-09-21 NOTE — Assessment & Plan Note (Signed)
Under good control off medicine. Continue to monitor. Call with any concerns.

## 2019-09-21 NOTE — Progress Notes (Deleted)
   There were no vitals taken for this visit.   Subjective:    Patient ID: Tracey Weaver, female    DOB: Aug 26, 1966, 54 y.o.   MRN: [id number]  HPI: Tracey Weaver is a 54 y.o. female  No chief complaint on file.  HYPERTENSION / HYPERLIPIDEMIA Satisfied with current treatment? {Blank single:19197::"yes","no"} Duration of hypertension: {Blank single:19197::"chronic","months","years"} BP monitoring frequency: {Blank single:19197::"not checking","rarely","daily","weekly","monthly","a few times a day","a few times a week","a few times a month"} BP range:  BP medication side effects: {Blank single:19197::"yes","no"} Past BP meds: {Blank A999333 (bystolic)","carvedilol","chlorthalidone","clonidine","diltiazem","exforge HCT","HCTZ","irbesartan (avapro)","labetalol","lisinopril","lisinopril-HCTZ","losartan (cozaar)","methyldopa","nifedipine","olmesartan (benicar)","olmesartan-HCTZ","quinapril","ramipril","spironalactone","tekturna","valsartan","valsartan-HCTZ","verapamil"} Duration of hyperlipidemia: {Blank single:19197::"chronic","months","years"} Cholesterol medication side effects: {Blank single:19197::"yes","no"} Cholesterol supplements: {Blank multiple:19196::"none","fish oil","niacin","red yeast rice"} Past cholesterol medications: {Blank multiple:19196::"none","atorvastain (lipitor)","lovastatin (mevacor)","pravastatin (pravachol)","rosuvastatin (crestor)","simvastatin (zocor)","vytorin","fenofibrate (tricor)","gemfibrozil","ezetimide (zetia)","niaspan","lovaza"} Medication compliance: {Blank single:19197::"excellent compliance","good compliance","fair compliance","poor compliance"} Aspirin: {Blank single:19197::"yes","no"} Recent stressors: {Blank single:19197::"yes","no"} Recurrent headaches: {Blank single:19197::"yes","no"} Visual changes: {Blank single:19197::"yes","no"} Palpitations:  {Blank single:19197::"yes","no"} Dyspnea: {Blank single:19197::"yes","no"} Chest pain: {Blank single:19197::"yes","no"} Lower extremity edema: {Blank single:19197::"yes","no"} Dizzy/lightheaded: {Blank single:19197::"yes","no"}   Relevant past medical, surgical, family and social history reviewed and updated as indicated. Interim medical history since our last visit reviewed. Allergies and medications reviewed and updated.  Review of Systems  Per HPI unless specifically indicated above     Objective:    There were no vitals taken for this visit.  Wt Readings from Last 3 Encounters:  03/10/19 148 lb (67.1 kg)  11/21/18 159 lb (72.1 kg)  05/19/18 167 lb (75.8 kg)    Physical Exam  Results for orders placed or performed in visit on [id number]  Basic metabolic panel  Result Value Ref Range   Glucose 81 65 - 99 mg/dL   BUN 13 6 - 24 mg/dL   Creatinine, Ser 0.49 (L) 0.57 - 1.00 mg/dL   GFR calc non Af Amer 113 >59 mL/min/1.73   GFR calc Af Amer 130 >59 mL/min/1.73   BUN/Creatinine Ratio 27 (H) 9 - 23   Sodium 141 134 - 144 mmol/L   Potassium 4.0 3.5 - 5.2 mmol/L   Chloride 102 96 - 106 mmol/L   CO2 26 20 - 29 mmol/L   Calcium 9.5 8.7 - 10.2 mg/dL      Assessment & Plan:   Problem List Items Addressed This Visit      Cardiovascular and Mediastinum   Hypertension     Digestive   GERD (gastroesophageal reflux disease)     Genitourinary   IC (interstitial cystitis)     Other   Mixed hyperlipidemia    Other Visit Diagnoses    Routine general medical examination at a health care facility    -  Primary       Follow up plan: No follow-ups on file.

## 2019-09-21 NOTE — Patient Instructions (Signed)
Health Maintenance, Female Adopting a healthy lifestyle and getting preventive care are important in promoting health and wellness. Ask your health care provider about:  The right schedule for you to have regular tests and exams.  Things you can do on your own to prevent diseases and keep yourself healthy. What should I know about diet, weight, and exercise? Eat a healthy diet   Eat a diet that includes plenty of vegetables, fruits, low-fat dairy products, and lean protein.  Do not eat a lot of foods that are high in solid fats, added sugars, or sodium. Maintain a healthy weight Body mass index (BMI) is used to identify weight problems. It estimates body fat based on height and weight. Your health care provider can help determine your BMI and help you achieve or maintain a healthy weight. Get regular exercise Get regular exercise. This is one of the most important things you can do for your health. Most adults should:  Exercise for at least 150 minutes each week. The exercise should increase your heart rate and make you sweat (moderate-intensity exercise).  Do strengthening exercises at least twice a week. This is in addition to the moderate-intensity exercise.  Spend less time sitting. Even light physical activity can be beneficial. Watch cholesterol and blood lipids Have your blood tested for lipids and cholesterol at 54 years of age, then have this test every 5 years. Have your cholesterol levels checked more often if:  Your lipid or cholesterol levels are high.  You are older than 54 years of age.  You are at high risk for heart disease. What should I know about cancer screening? Depending on your health history and family history, you may need to have cancer screening at various ages. This may include screening for:  Breast cancer.  Cervical cancer.  Colorectal cancer.  Skin cancer.  Lung cancer. What should I know about heart disease, diabetes, and high blood  pressure? Blood pressure and heart disease  High blood pressure causes heart disease and increases the risk of stroke. This is more likely to develop in people who have high blood pressure readings, are of African descent, or are overweight.  Have your blood pressure checked: ? Every 3-5 years if you are 18-39 years of age. ? Every year if you are 40 years old or older. Diabetes Have regular diabetes screenings. This checks your fasting blood sugar level. Have the screening done:  Once every three years after age 40 if you are at a normal weight and have a low risk for diabetes.  More often and at a younger age if you are overweight or have a high risk for diabetes. What should I know about preventing infection? Hepatitis B If you have a higher risk for hepatitis B, you should be screened for this virus. Talk with your health care provider to find out if you are at risk for hepatitis B infection. Hepatitis C Testing is recommended for:  Everyone born from 1945 through 1965.  Anyone with known risk factors for hepatitis C. Sexually transmitted infections (STIs)  Get screened for STIs, including gonorrhea and chlamydia, if: ? You are sexually active and are younger than 54 years of age. ? You are older than 54 years of age and your health care provider tells you that you are at risk for this type of infection. ? Your sexual activity has changed since you were last screened, and you are at increased risk for chlamydia or gonorrhea. Ask your health care provider if   you are at risk.  Ask your health care provider about whether you are at high risk for HIV. Your health care provider may recommend a prescription medicine to help prevent HIV infection. If you choose to take medicine to prevent HIV, you should first get tested for HIV. You should then be tested every 3 months for as long as you are taking the medicine. Pregnancy  If you are about to stop having your period (premenopausal) and  you may become pregnant, seek counseling before you get pregnant.  Take 400 to 800 micrograms (mcg) of folic acid every day if you become pregnant.  Ask for birth control (contraception) if you want to prevent pregnancy. Osteoporosis and menopause Osteoporosis is a disease in which the bones lose minerals and strength with aging. This can result in bone fractures. If you are 65 years old or older, or if you are at risk for osteoporosis and fractures, ask your health care provider if you should:  Be screened for bone loss.  Take a calcium or vitamin D supplement to lower your risk of fractures.  Be given hormone replacement therapy (HRT) to treat symptoms of menopause. Follow these instructions at home: Lifestyle  Do not use any products that contain nicotine or tobacco, such as cigarettes, e-cigarettes, and chewing tobacco. If you need help quitting, ask your health care provider.  Do not use street drugs.  Do not share needles.  Ask your health care provider for help if you need support or information about quitting drugs. Alcohol use  Do not drink alcohol if: ? Your health care provider tells you not to drink. ? You are pregnant, may be pregnant, or are planning to become pregnant.  If you drink alcohol: ? Limit how much you use to 0-1 drink a day. ? Limit intake if you are breastfeeding.  Be aware of how much alcohol is in your drink. In the [place] one drink equals one 12 oz bottle of beer (355 mL), one 5 oz glass of wine (148 mL), or one 1 oz glass of hard liquor (44 mL). General instructions  Schedule regular health, dental, and eye exams.  Stay current with your vaccines.  Tell your health care provider if: ? You often feel depressed. ? You have ever been abused or do not feel safe at home. Summary  Adopting a healthy lifestyle and getting preventive care are important in promoting health and wellness.  Follow your health care provider's instructions about healthy  diet, exercising, and getting tested or screened for diseases.  Follow your health care provider's instructions on monitoring your cholesterol and blood pressure. This information is not intended to replace advice given to you by your health care provider. Make sure you discuss any questions you have with your health care provider. Document Revised: 08/06/2018 Document Reviewed: 08/06/2018 Elsevier Patient Education  2020 Elsevier Inc.  

## 2019-09-21 NOTE — Assessment & Plan Note (Signed)
Under good control on current regimen. Continue current regimen. Continue to monitor. Call with any concerns. Refills given. Labs drawn today.   

## 2019-09-21 NOTE — Assessment & Plan Note (Signed)
HR under good control. Continue to avoid caffeine. Call with any concerns.

## 2019-09-22 ENCOUNTER — Telehealth: Payer: Self-pay

## 2019-09-22 ENCOUNTER — Other Ambulatory Visit: Payer: Self-pay

## 2019-09-22 DIAGNOSIS — Z1211 Encounter for screening for malignant neoplasm of colon: Secondary | ICD-10-CM

## 2019-09-22 LAB — COMPREHENSIVE METABOLIC PANEL
ALT: 58 IU/L — ABNORMAL HIGH (ref 0–32)
AST: 30 IU/L (ref 0–40)
Albumin/Globulin Ratio: 2 (ref 1.2–2.2)
Albumin: 4.3 g/dL (ref 3.8–4.9)
Alkaline Phosphatase: 73 IU/L (ref 39–117)
BUN/Creatinine Ratio: 21 (ref 9–23)
BUN: 12 mg/dL (ref 6–24)
Bilirubin Total: 0.3 mg/dL (ref 0.0–1.2)
CO2: 27 mmol/L (ref 20–29)
Calcium: 9.2 mg/dL (ref 8.7–10.2)
Chloride: 102 mmol/L (ref 96–106)
Creatinine, Ser: 0.58 mg/dL (ref 0.57–1.00)
GFR calc Af Amer: 122 mL/min/{1.73_m2} (ref >59)
GFR calc non Af Amer: 106 mL/min/{1.73_m2} (ref >59)
Globulin, Total: 2.1 g/dL (ref 1.5–4.5)
Glucose: 77 mg/dL (ref 65–99)
Potassium: 3.7 mmol/L (ref 3.5–5.2)
Sodium: 142 mmol/L (ref 134–144)
Total Protein: 6.4 g/dL (ref 6.0–8.5)

## 2019-09-22 LAB — CBC WITH DIFFERENTIAL/PLATELET
Basophils Absolute: 0.1 10*3/uL (ref 0.0–0.2)
Basos: 1 %
EOS (ABSOLUTE): 0.2 10*3/uL (ref 0.0–0.4)
Eos: 3 %
Hematocrit: 41.3 % (ref 34.0–46.6)
Hemoglobin: 13.7 g/dL (ref 11.1–15.9)
Immature Grans (Abs): 0 10*3/uL (ref 0.0–0.1)
Immature Granulocytes: 0 %
Lymphocytes Absolute: 2.1 10*3/uL (ref 0.7–3.1)
Lymphs: 35 %
MCH: 31.4 pg (ref 26.6–33.0)
MCHC: 33.2 g/dL (ref 31.5–35.7)
MCV: 95 fL (ref 79–97)
Monocytes Absolute: 0.3 10*3/uL (ref 0.1–0.9)
Monocytes: 5 %
Neutrophils Absolute: 3.4 10*3/uL (ref 1.4–7.0)
Neutrophils: 56 %
Platelets: 236 10*3/uL (ref 150–450)
RBC: 4.37 x10E6/uL (ref 3.77–5.28)
RDW: 11.9 % (ref 11.7–15.4)
WBC: 6.1 10*3/uL (ref 3.4–10.8)

## 2019-09-22 LAB — LIPID PANEL W/O CHOL/HDL RATIO
Cholesterol, Total: 228 mg/dL — ABNORMAL HIGH (ref 100–199)
HDL: 66 mg/dL (ref >39)
LDL Chol Calc (NIH): 106 mg/dL — ABNORMAL HIGH (ref 0–99)
Triglycerides: 334 mg/dL — ABNORMAL HIGH (ref 0–149)
VLDL Cholesterol Cal: 56 mg/dL — ABNORMAL HIGH (ref 5–40)

## 2019-09-22 LAB — B12 AND FOLATE PANEL
Folate: >20 ng/mL (ref >3.0)
Vitamin B-12: >2000 pg/mL — ABNORMAL HIGH (ref 232–1245)

## 2019-09-22 LAB — TSH: TSH: 2.11 u[IU]/mL (ref 0.450–4.500)

## 2019-09-22 MED ORDER — NA SULFATE-K SULFATE-MG SULF 17.5-3.13-1.6 GM/177ML PO SOLN: 354.0000 mL | Freq: Once | ORAL | 0 refills | Status: AC

## 2019-09-22 NOTE — Telephone Encounter (Signed)
Gastroenterology Pre-Procedure Review    PATIENT REVIEW QUESTIONS: The patient responded to the following health history questions as indicated:    1. Are you having any GI issues? no 2. Do you have a personal history of Polyps? no 3. Do you have a family history of Colon Cancer or Polyps? Mother colon polyps, Paternal Aunt Colon cancer   4. Diabetes Mellitus? no 5. Joint replacements in the past 12 months?no 6. Major health problems in the past 3 months?no 7. Any artificial heart valves, MVP, or defibrillator?no    MEDICATIONS & ALLERGIES:    Patient reports the following regarding taking any anticoagulation/antiplatelet therapy:   Plavix, Coumadin, Eliquis, Xarelto, Lovenox, Pradaxa, Brilinta, or Effient? no Aspirin? no  Patient confirms/reports the following medications:  Current Outpatient Medications  Medication Sig Dispense Refill  . Black Elderberry,Berry-Flower, 575 MG CAPS Take by mouth daily.    . CASCARA SAGRADA PO Take by mouth.    . Cyanocobalamin 2500 MCG SUBL Place under the tongue daily.     . cyclobenzaprine (FLEXERIL) 5 MG tablet Take by mouth as needed.     . diclofenac (VOLTAREN) 75 MG EC tablet Take 1 tablet (75 mg total) by mouth 2 (two) times daily as needed. 180 tablet 1  . estrogens, conjugated, (PREMARIN) 0.625 MG tablet TAKE 1 TABLET (0.625 MG TOTAL) BY MOUTH DAILY. TAKE DAILY FOR 21 DAYS THEN DO NOT TAKE FOR 7 DAYS. 84 tablet 2  . gabapentin (NEURONTIN) 100 MG capsule Take 1 capsule (100 mg total) by mouth at bedtime. 90 capsule 1  . meclizine (ANTIVERT) 25 MG tablet Take 1 tablet (25 mg total) by mouth 3 (three) times daily as needed for dizziness. 30 tablet 0  . Multiple Vitamin (MULTIVITAMIN) capsule Take by mouth daily.     [name] RETIN-A MICRO PUMP 0.06 % GEL APPLY 1 APPLICATION ON THE SKIN AT NIGHT/BEDTIME  0  . VITAMIN D PO Take by mouth daily. Gummies     No current facility-administered medications for this visit.    Patient confirms/reports the  following allergies:  Allergies  Allergen Reactions  . Meloxicam Other (See Comments)    "MAKES ME FEEL FUNNY", Dizziness    No orders of the defined types were placed in this encounter.   AUTHORIZATION INFORMATION Primary Insurance: 1D#: Group #:  Secondary Insurance: 1D#: Group #:  SCHEDULE INFORMATION: Date: 11/23/2019 Time: Location: [place]

## 2019-10-07 DIAGNOSIS — Z86018 Personal history of other benign neoplasm: Secondary | ICD-10-CM | POA: Diagnosis not present

## 2019-10-07 DIAGNOSIS — L578 Other skin changes due to chronic exposure to nonionizing radiation: Secondary | ICD-10-CM | POA: Diagnosis not present

## 2019-10-07 DIAGNOSIS — Z872 Personal history of diseases of the skin and subcutaneous tissue: Secondary | ICD-10-CM | POA: Diagnosis not present

## 2019-10-16 DIAGNOSIS — L905 Scar conditions and fibrosis of skin: Secondary | ICD-10-CM | POA: Diagnosis not present

## 2019-11-02 IMAGING — MG MM DIGITAL DIAGNOSTIC UNILAT*L* W/ TOMO W/ CAD
6 of 9 series · 6 of 21 positions shown · non-contrast
Comparison: Previous exam(s).

CLINICAL DATA: 51-year-old female presenting for focal left breast
pain on the far lateral aspect of the left breast at 3 o'clock at
the junction with the chest wall..

EXAM:
2D DIGITAL DIAGNOSTIC UNILATERAL LEFT MAMMOGRAM WITH CAD AND ADJUNCT
TOMO
LEFT BREAST ULTRASOUND

[L CC (1 of 2)]
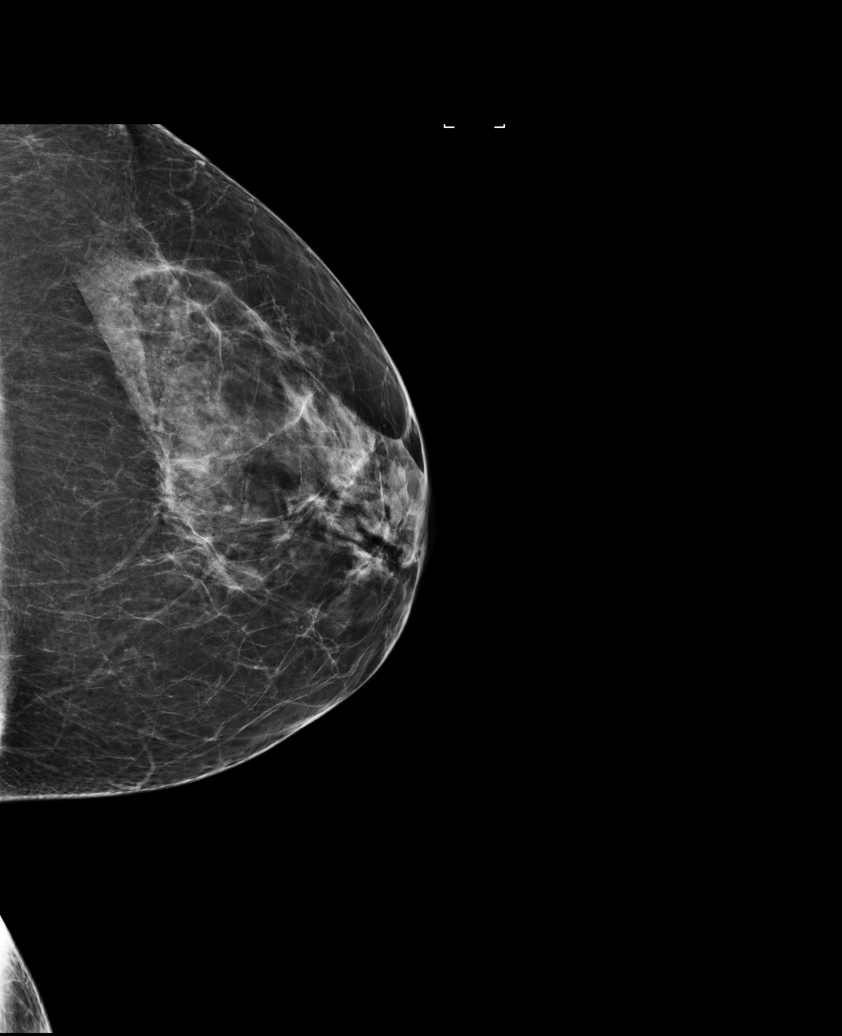

[L MLO]
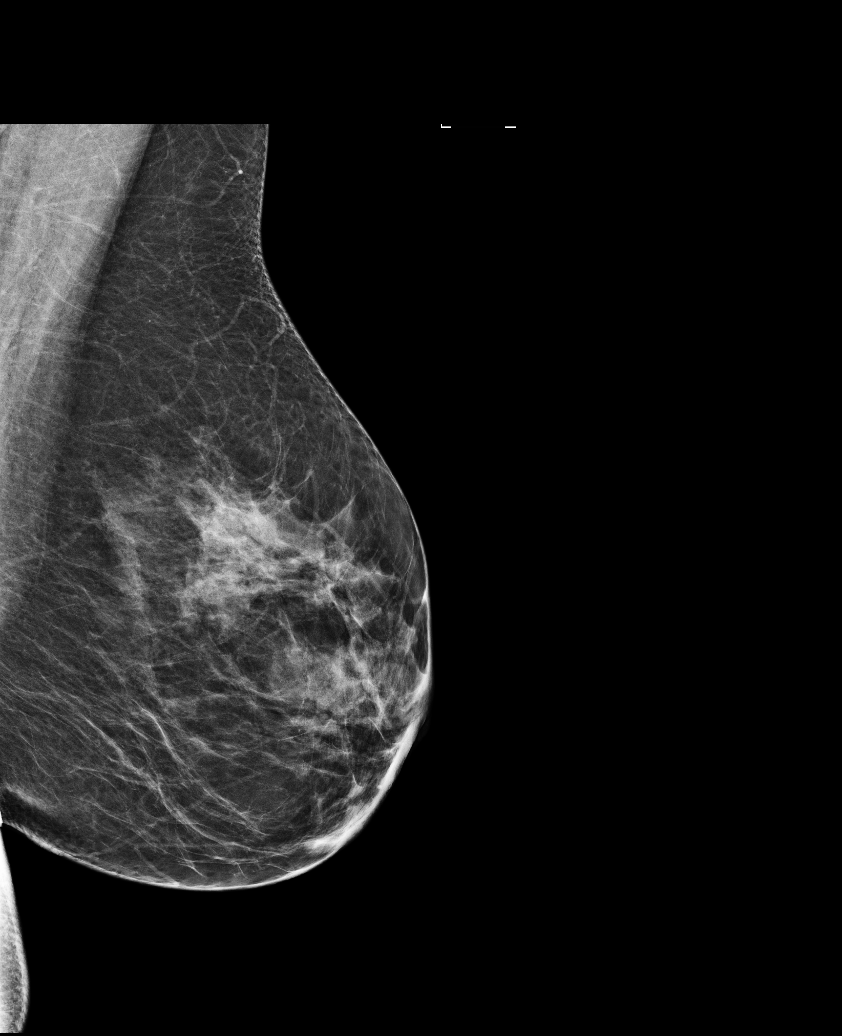

[L CC synth-2D (1 of 2)]
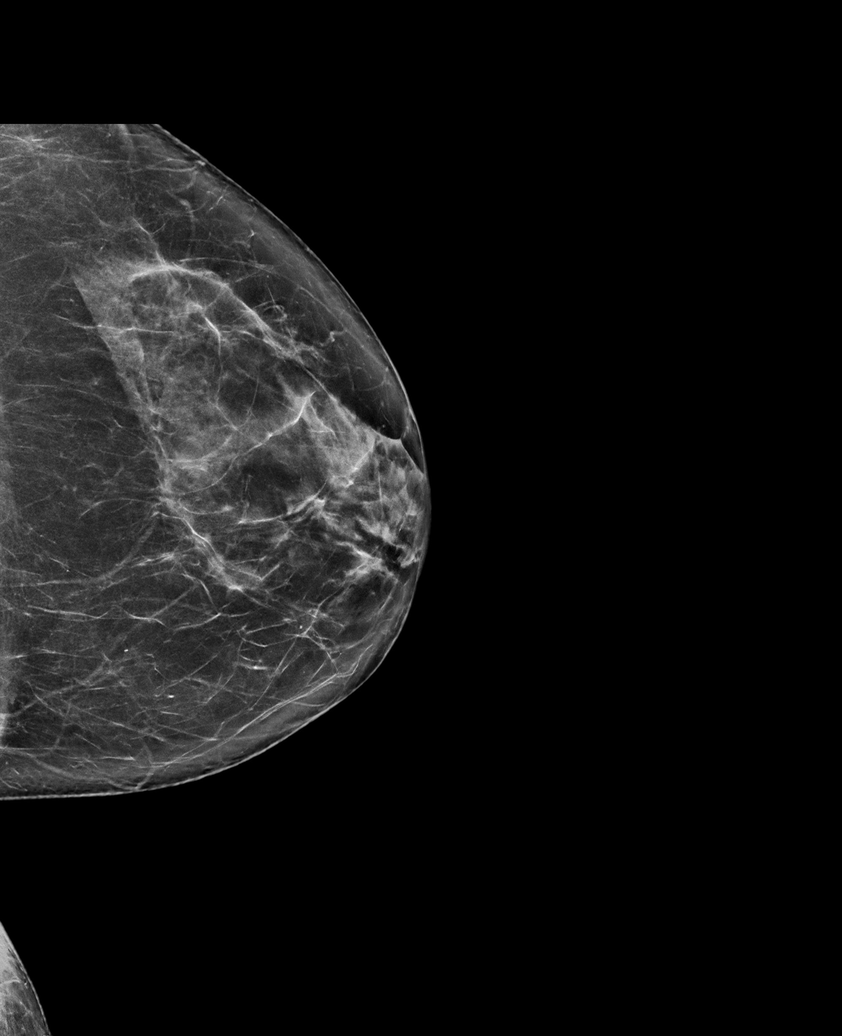

[L CC (2 of 2)]
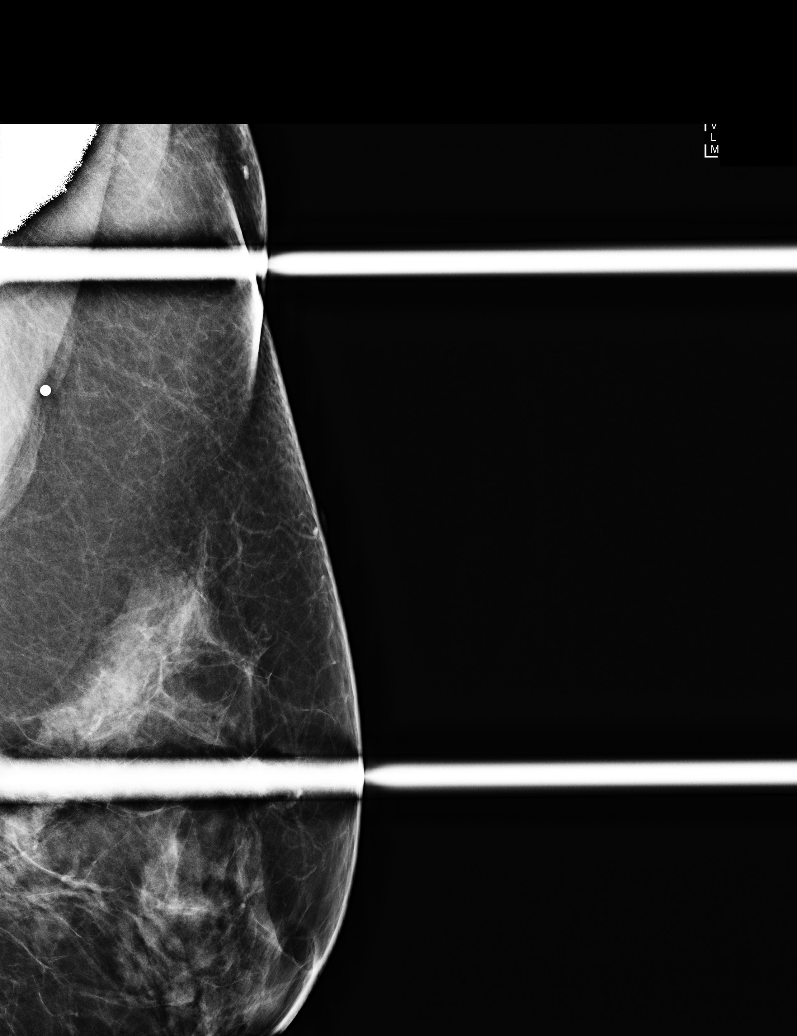

[L MLO synth-2D]
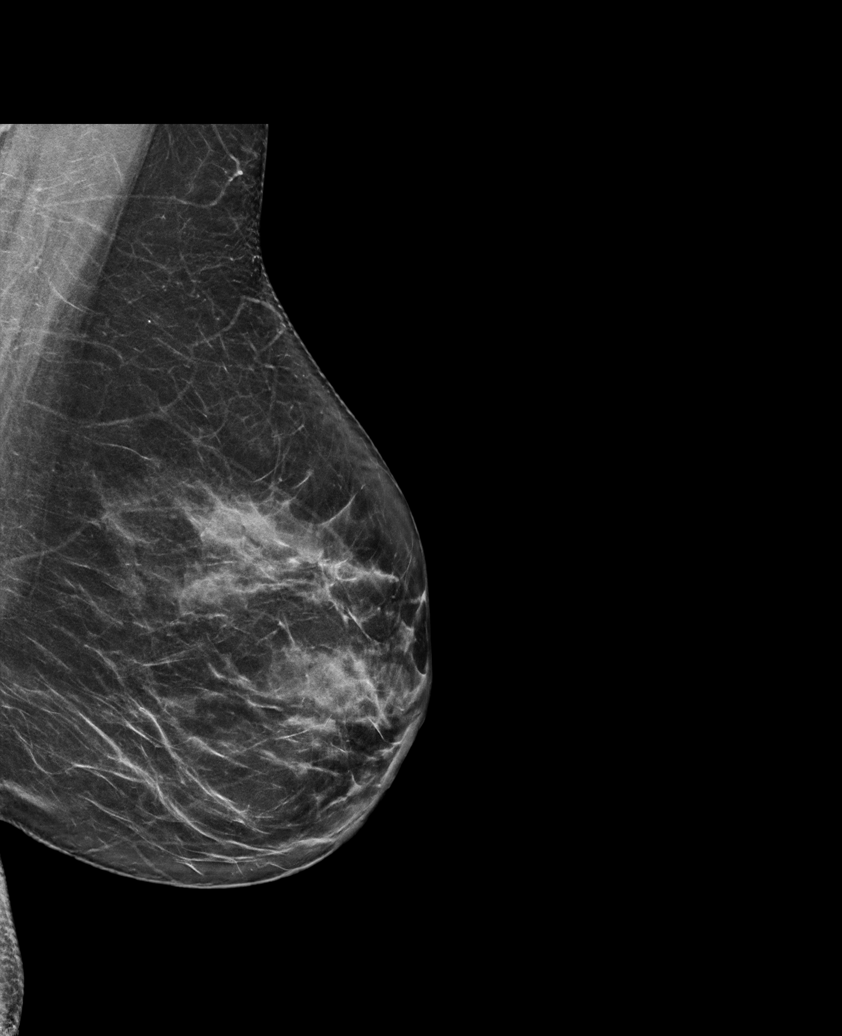

[L CC synth-2D (2 of 2)]
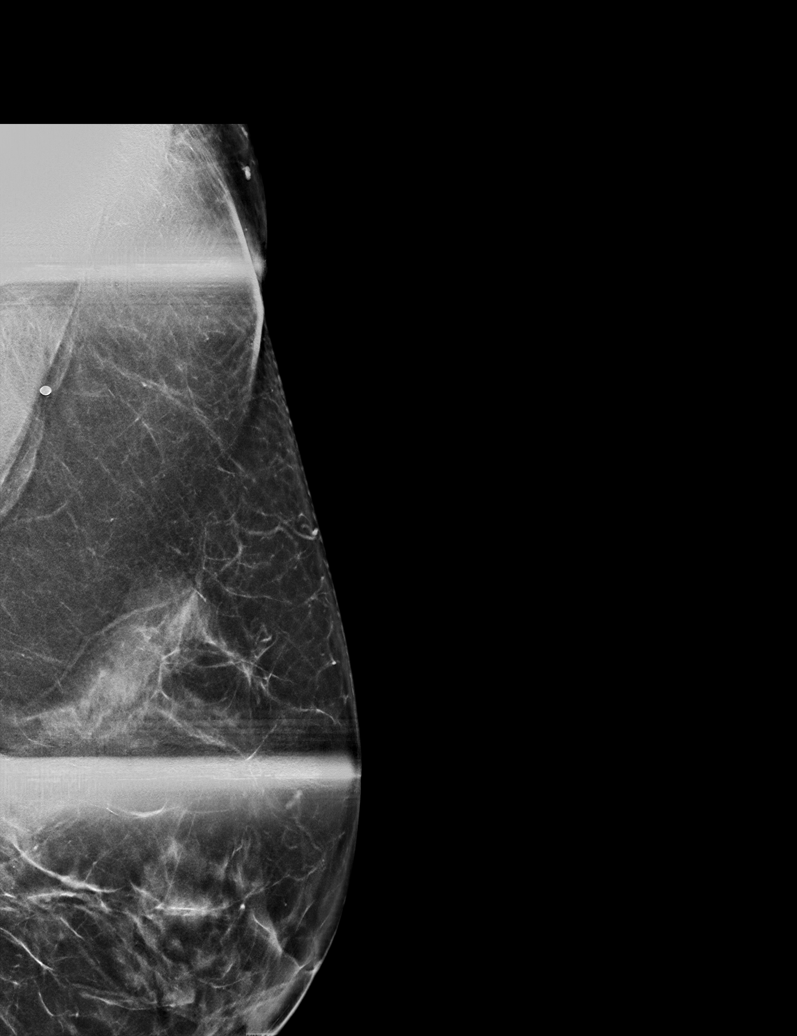

[6 of 21 positions shown; findings below may reference images not displayed]

ACR Breast Density Category c: The breast tissue is heterogeneously
dense, which may obscure small masses.
FINDINGS: No suspicious calcifications, masses or areas of distortion are seen
in the breast.

Mammographic images were processed with CAD.

Ultrasound of the site of focal pain at 3 o'clock in the left breast
approximately 15 cm from the nipple demonstrates normal subcutaneous
tissue. No masses or suspicious areas of shadowing are identified.
IMPRESSION: 1. There are no mammographic or targeted sonographic abnormalities
at the site of focal pain in the lateral aspect of the left breast.

RECOMMENDATION:
Return to routine screening mammography is recommended. The patient
will be due for screening in Thursday January, 2018.

I have discussed the findings and recommendations with the patient.
Results were also provided in writing at the conclusion of the
visit. If applicable, a reminder letter will be sent to the patient
regarding the next appointment.

BI-RADS CATEGORY  1: Negative.

## 2019-11-09 DIAGNOSIS — R634 Abnormal weight loss: Secondary | ICD-10-CM | POA: Diagnosis not present

## 2019-11-09 DIAGNOSIS — Z9889 Other specified postprocedural states: Secondary | ICD-10-CM | POA: Diagnosis not present

## 2019-11-16 ENCOUNTER — Other Ambulatory Visit: Payer: Self-pay

## 2019-11-16 ENCOUNTER — Encounter: Payer: Self-pay | Admitting: Gastroenterology

## 2019-11-18 ENCOUNTER — Telehealth: Payer: Self-pay | Admitting: Family Medicine

## 2019-11-18 NOTE — Telephone Encounter (Signed)
Routing to provider to advise.  

## 2019-11-18 NOTE — Telephone Encounter (Signed)
Pt states she hasn't been on estrogens, conjugated, (PREMARIN) 0.625 MG tablet for two weeks and is miserable (night sweats) and has just found out that insurance will not cover this medication after April 1st.  Wants to know if there is something else that she can take. Pt uses  [id number] - [place], [place] - [address] Phone:  [phone]  Fax:  [phone]

## 2019-11-19 ENCOUNTER — Other Ambulatory Visit
Admission: RE | Admit: 2019-11-19 | Discharge: 2019-11-19 | Disposition: A | Discharge status: Home / Self Care | Payer: BC Managed Care – PPO | Source: Ambulatory Visit | Attending: Gastroenterology | Admitting: Gastroenterology

## 2019-11-19 DIAGNOSIS — Z01812 Encounter for preprocedural laboratory examination: Secondary | ICD-10-CM | POA: Insufficient documentation

## 2019-11-19 DIAGNOSIS — Z20822 Contact with and (suspected) exposure to covid-19: Secondary | ICD-10-CM | POA: Diagnosis not present

## 2019-11-19 LAB — SARS CORONAVIRUS 2 (TAT 6-24 HRS): SARS Coronavirus 2: NEGATIVE

## 2019-11-19 NOTE — Telephone Encounter (Signed)
Please have her find out what they will cover.

## 2019-11-19 NOTE — Telephone Encounter (Signed)
Patient notified, she will let us know what her insurance will cover.

## 2019-11-19 NOTE — Discharge Instructions (Signed)
General Anesthesia, Adult, Care After This sheet gives you information about how to care for yourself after your procedure. Your health care provider may also give you more specific instructions. If you have problems or questions, contact your health care provider. What can I expect after the procedure? After the procedure, the following side effects are common:  Pain or discomfort at the IV site.  Nausea.  Vomiting.  Sore throat.  Trouble concentrating.  Feeling cold or chills.  Weak or tired.  Sleepiness and fatigue.  Soreness and body aches. These side effects can affect parts of the body that were not involved in surgery. Follow these instructions at home:  For at least 24 hours after the procedure:  Have a responsible adult stay with you. It is important to have someone help care for you until you are awake and alert.  Rest as needed.  Do not: ? Participate in activities in which you could fall or become injured. ? Drive. ? Use heavy machinery. ? Drink alcohol. ? Take sleeping pills or medicines that cause drowsiness. ? Make important decisions or sign legal documents. ? Take care of children on your own. Eating and drinking  Follow any instructions from your health care provider about eating or drinking restrictions.  When you feel hungry, start by eating small amounts of foods that are soft and easy to digest (bland), such as toast. Gradually return to your regular diet.  Drink enough fluid to keep your urine pale yellow.  If you vomit, rehydrate by drinking water, juice, or clear broth. General instructions  If you have sleep apnea, surgery and certain medicines can increase your risk for breathing problems. Follow instructions from your health care provider about wearing your sleep device: ? Anytime you are sleeping, including during daytime naps. ? While taking prescription pain medicines, sleeping medicines, or medicines that make you drowsy.  Return to  your normal activities as told by your health care provider. Ask your health care provider what activities are safe for you.  Take over-the-counter and prescription medicines only as told by your health care provider.  If you smoke, do not smoke without supervision.  Keep all follow-up visits as told by your health care provider. This is important. Contact a health care provider if:  You have nausea or vomiting that does not get better with medicine.  You cannot eat or drink without vomiting.  You have pain that does not get better with medicine.  You are unable to pass urine.  You develop a skin rash.  You have a fever.  You have redness around your IV site that gets worse. Get help right away if:  You have difficulty breathing.  You have chest pain.  You have blood in your urine or stool, or you vomit blood. Summary  After the procedure, it is common to have a sore throat or nausea. It is also common to feel tired.  Have a responsible adult stay with you for the first 24 hours after general anesthesia. It is important to have someone help care for you until you are awake and alert.  When you feel hungry, start by eating small amounts of foods that are soft and easy to digest (bland), such as toast. Gradually return to your regular diet.  Drink enough fluid to keep your urine pale yellow.  Return to your normal activities as told by your health care provider. Ask your health care provider what activities are safe for you. This information is not   intended to replace advice given to you by your health care provider. Make sure you discuss any questions you have with your health care provider. Document Revised: 08/16/2017 Document Reviewed: 03/29/2017 Elsevier Patient Education  2020 Elsevier Inc.  

## 2019-11-23 ENCOUNTER — Ambulatory Visit
Admission: RE | Admit: 2019-11-23 | Discharge: 2019-11-23 | Disposition: A | Discharge status: Home / Self Care | Payer: BC Managed Care – PPO | Attending: Gastroenterology | Admitting: Gastroenterology

## 2019-11-23 ENCOUNTER — Encounter
Admission: RE | Disposition: A | Discharge status: Home / Self Care | Payer: Self-pay | Source: Home / Self Care | Attending: Gastroenterology

## 2019-11-23 ENCOUNTER — Other Ambulatory Visit: Payer: Self-pay

## 2019-11-23 ENCOUNTER — Ambulatory Visit: Payer: BC Managed Care – PPO | Admitting: Anesthesiology

## 2019-11-23 ENCOUNTER — Encounter: Payer: Self-pay | Admitting: Gastroenterology

## 2019-11-23 DIAGNOSIS — K589 Irritable bowel syndrome without diarrhea: Secondary | ICD-10-CM | POA: Insufficient documentation

## 2019-11-23 DIAGNOSIS — M5030 Other cervical disc degeneration, unspecified cervical region: Secondary | ICD-10-CM | POA: Insufficient documentation

## 2019-11-23 DIAGNOSIS — Z888 Allergy status to other drugs, medicaments and biological substances status: Secondary | ICD-10-CM | POA: Diagnosis not present

## 2019-11-23 DIAGNOSIS — Z8541 Personal history of malignant neoplasm of cervix uteri: Secondary | ICD-10-CM | POA: Insufficient documentation

## 2019-11-23 DIAGNOSIS — Z90710 Acquired absence of both cervix and uterus: Secondary | ICD-10-CM | POA: Insufficient documentation

## 2019-11-23 DIAGNOSIS — Z803 Family history of malignant neoplasm of breast: Secondary | ICD-10-CM | POA: Diagnosis not present

## 2019-11-23 DIAGNOSIS — Z791 Long term (current) use of non-steroidal anti-inflammatories (NSAID): Secondary | ICD-10-CM | POA: Diagnosis not present

## 2019-11-23 DIAGNOSIS — K640 First degree hemorrhoids: Secondary | ICD-10-CM | POA: Diagnosis not present

## 2019-11-23 DIAGNOSIS — Z1211 Encounter for screening for malignant neoplasm of colon: Secondary | ICD-10-CM | POA: Insufficient documentation

## 2019-11-23 DIAGNOSIS — Z820 Family history of epilepsy and other diseases of the nervous system: Secondary | ICD-10-CM | POA: Diagnosis not present

## 2019-11-23 DIAGNOSIS — Z818 Family history of other mental and behavioral disorders: Secondary | ICD-10-CM | POA: Insufficient documentation

## 2019-11-23 DIAGNOSIS — Z823 Family history of stroke: Secondary | ICD-10-CM | POA: Insufficient documentation

## 2019-11-23 DIAGNOSIS — Z87442 Personal history of urinary calculi: Secondary | ICD-10-CM | POA: Insufficient documentation

## 2019-11-23 DIAGNOSIS — Z825 Family history of asthma and other chronic lower respiratory diseases: Secondary | ICD-10-CM | POA: Diagnosis not present

## 2019-11-23 DIAGNOSIS — Z79899 Other long term (current) drug therapy: Secondary | ICD-10-CM | POA: Insufficient documentation

## 2019-11-23 DIAGNOSIS — K6389 Other specified diseases of intestine: Secondary | ICD-10-CM | POA: Insufficient documentation

## 2019-11-23 DIAGNOSIS — Z811 Family history of alcohol abuse and dependence: Secondary | ICD-10-CM | POA: Insufficient documentation

## 2019-11-23 DIAGNOSIS — Z8249 Family history of ischemic heart disease and other diseases of the circulatory system: Secondary | ICD-10-CM | POA: Diagnosis not present

## 2019-11-23 DIAGNOSIS — Z833 Family history of diabetes mellitus: Secondary | ICD-10-CM | POA: Diagnosis not present

## 2019-11-23 DIAGNOSIS — Z8349 Family history of other endocrine, nutritional and metabolic diseases: Secondary | ICD-10-CM | POA: Diagnosis not present

## 2019-11-23 HISTORY — DX: Torticollis: M43.6

## 2019-11-23 HISTORY — DX: Nausea with vomiting, unspecified: R11.2

## 2019-11-23 HISTORY — DX: Headache, unspecified: R51.9

## 2019-11-23 HISTORY — PX: COLONOSCOPY WITH PROPOFOL: SHX5780

## 2019-11-23 HISTORY — DX: Other specified postprocedural states: Z98.890

## 2019-11-23 HISTORY — DX: Dizziness and giddiness: R42

## 2019-11-23 SURGERY — COLONOSCOPY WITH PROPOFOL
Anesthesia: General | Site: Rectum

## 2019-11-23 MED ORDER — ONDANSETRON HCL 4 MG/2ML IJ SOLN: 4.0000 mg | Freq: Once | INTRAMUSCULAR | Status: DC | PRN

## 2019-11-23 MED ORDER — LIDOCAINE HCL (CARDIAC) PF 100 MG/5ML IV SOSY
PREFILLED_SYRINGE | INTRAVENOUS | Status: DC | PRN
  Administered 2019-11-23: 50 mg via INTRAVENOUS

## 2019-11-23 MED ORDER — ACETAMINOPHEN 10 MG/ML IV SOLN: 1000.0000 mg | Freq: Once | INTRAVENOUS | Status: DC | PRN

## 2019-11-23 MED ORDER — PROPOFOL 10 MG/ML IV BOLUS
INTRAVENOUS | Status: DC | PRN
  Administered 2019-11-23: 100 mg via INTRAVENOUS

## 2019-11-23 MED ORDER — LACTATED RINGERS IV SOLN
100.0000 mL/h | INTRAVENOUS | Status: DC
  Administered 2019-11-23: 10:00:00 100 mL/h via INTRAVENOUS

## 2019-11-23 MED ORDER — STERILE WATER FOR IRRIGATION IR SOLN
Status: DC | PRN
  Administered 2019-11-23: 50 mL

## 2019-11-23 SURGICAL SUPPLY — 5 items
CANISTER SUCT 1200ML W/VALVE (MISCELLANEOUS) ×2 IMPLANT
GOWN CVR UNV OPN BCK APRN NK (MISCELLANEOUS) ×2 IMPLANT
GOWN ISOL THUMB LOOP REG UNIV (MISCELLANEOUS) ×2
KIT ENDO PROCEDURE OLY (KITS) ×2 IMPLANT
WATER STERILE IRR 250ML POUR (IV SOLUTION) ×2 IMPLANT

## 2019-11-23 NOTE — Anesthesia Preprocedure Evaluation (Addendum)
Anesthesia Evaluation  Patient identified by MRN, date of birth, ID band Patient awake    Reviewed: Allergy & Precautions, NPO status , Patient's Chart, lab work & pertinent test results  History of Anesthesia Complications (+) PONV and history of anesthetic complications  Airway Mallampati: I  TM Distance: >3 FB Neck ROM: Full    Dental   Pulmonary    breath sounds clear to auscultation       Cardiovascular hypertension, (-) angina(-) DOE  Rhythm:Regular Rate:Normal   HLD   Neuro/Psych  Headaches, PSYCHIATRIC DISORDERS Anxiety Depression    GI/Hepatic PUD, GERD  Controlled, IBS   Endo/Other    Renal/GU Renal disease (Stones)     Musculoskeletal  (+) Arthritis ,   Abdominal   Peds  Hematology   Anesthesia Other Findings Cervical cancer Splenomegaly Lymphoma  Reproductive/Obstetrics                            Anesthesia Physical Anesthesia Plan  ASA: II  Anesthesia Plan: General   Post-op Pain Management:    Induction: Intravenous  PONV Risk Score and Plan: 4 or greater and Propofol infusion, TIVA, Treatment may vary due to age or medical condition, Ondansetron and Dexamethasone  Airway Management Planned: Natural Airway and Nasal Cannula  Additional Equipment:   Intra-op Plan:   Post-operative Plan:   Informed Consent: I have reviewed the patients History and Physical, chart, labs and discussed the procedure including the risks, benefits and alternatives for the proposed anesthesia with the patient or authorized representative who has indicated his/her understanding and acceptance.       Plan Discussed with: CRNA and Anesthesiologist  Anesthesia Plan Comments:         Anesthesia Quick Evaluation

## 2019-11-23 NOTE — Anesthesia Procedure Notes (Signed)
Procedure Name: General with mask airway Date/Time: 11/23/2019 10:33 AM Performed by: Jeannene Patella, CRNA Pre-anesthesia Checklist: Timeout performed, Patient being monitored, Suction available, Emergency Drugs available and Patient identified Patient Re-evaluated:Patient Re-evaluated prior to induction Oxygen Delivery Method: Nasal cannula

## 2019-11-23 NOTE — Transfer of Care (Signed)
Immediate Anesthesia Transfer of Care Note  Patient: Tracey Weaver  Procedure(s) Performed: COLONOSCOPY WITH PROPOFOL (N/A Rectum)  Patient Location: PACU  Anesthesia Type: General  Level of Consciousness: awake, alert  and patient cooperative  Airway and Oxygen Therapy: Patient Spontanous Breathing and Patient connected to supplemental oxygen  Post-op Assessment: Post-op Vital signs reviewed, Patient's Cardiovascular Status Stable, Respiratory Function Stable, Patent Airway and No signs of Nausea or vomiting  Post-op Vital Signs: Reviewed and stable  Complications: No apparent anesthesia complications

## 2019-11-23 NOTE — Op Note (Signed)
[facility] Gastroenterology Patient Name: Tracey Weaver Procedure Date: 11/23/2019 10:14 AM MRN: [id number] Account #: [id number] Date of Birth: [birth year] Admit Type: Outpatient Age: 54 Room: [facility] OR ROOM 01 Gender: Female Note Status: Finalized Procedure:             Colonoscopy Indications:           Screening for colorectal malignant neoplasm Providers:             Lucilla Lame MD, MD Referring MD:          Valerie Roys (Referring MD) Medicines:             Propofol per Anesthesia Complications:         No immediate complications. Procedure:             Pre-Anesthesia Assessment:                        - Prior to the procedure, a History and Physical was                         performed, and patient medications and allergies were                         reviewed. The patient's tolerance of previous                         anesthesia was also reviewed. The risks and benefits                         of the procedure and the sedation options and risks                         were discussed with the patient. All questions were                         answered, and informed consent was obtained. Prior                         Anticoagulants: The patient has taken no previous                         anticoagulant or antiplatelet agents. ASA Grade                         Assessment: II - A patient with mild systemic disease.                         After reviewing the risks and benefits, the patient                         was deemed in satisfactory condition to undergo the                         procedure.                        After obtaining informed consent, the colonoscope was  passed under direct vision. Throughout the procedure,                         the patient's blood pressure, pulse, and oxygen                         saturations were monitored continuously. The was                         introduced through the anus and  advanced to the the                         cecum, identified by appendiceal orifice and ileocecal                         valve. The colonoscopy was performed without                         difficulty. The patient tolerated the procedure well.                         The quality of the bowel preparation was excellent. Findings:      The perianal and digital rectal examinations were normal.      A diffuse area of moderate melanosis was found in the entire colon.      Non-bleeding internal hemorrhoids were found during retroflexion. The       hemorrhoids were Grade I (internal hemorrhoids that do not prolapse). Impression:            - Melanosis in the colon.                        - Non-bleeding internal hemorrhoids.                        - No specimens collected. Recommendation:        - Discharge patient to home.                        - Resume previous diet.                        - Continue present medications.                        - Repeat colonoscopy in 10 years for screening unless                         any change in family history or lower GI problems. Procedure Code(s):     --- Professional ---                        [phone], Colonoscopy, flexible; diagnostic, including                         collection of specimen(s) by brushing or washing, when                         performed (separate procedure) Diagnosis Code(s):     --- Professional ---  Z12.11, Encounter for screening for malignant neoplasm                         of colon CPT copyright 2019 American Medical [employer]. All rights reserved. The codes documented in this report are preliminary and upon coder review may  be revised to meet current compliance requirements. Lucilla Lame MD, MD 11/23/2019 10:43:23 AM This report has been signed electronically. Number of Addenda: 0 Note Initiated On: 11/23/2019 10:14 AM Scope Withdrawal Time: 0 hours 7 minutes 22 seconds  Total Procedure Duration:  0 hours 12 minutes 45 seconds  Estimated Blood Loss:  Estimated blood loss: none.      [facility]

## 2019-11-23 NOTE — Anesthesia Postprocedure Evaluation (Signed)
Anesthesia Post Note  Patient: Tracey Weaver  Procedure(s) Performed: COLONOSCOPY WITH PROPOFOL (N/A Rectum)     Patient location during evaluation: PACU Anesthesia Type: General Level of consciousness: awake and alert Pain management: pain level controlled Vital Signs Assessment: post-procedure vital signs reviewed and stable Respiratory status: spontaneous breathing, nonlabored ventilation, respiratory function stable and patient connected to nasal cannula oxygen Cardiovascular status: blood pressure returned to baseline and stable Postop Assessment: no apparent nausea or vomiting Anesthetic complications: no    Chasyn Cinque A  Adhira Jamil

## 2019-11-23 NOTE — H&P (Signed)
Lucilla Lame, MD [address], [place] [address] Phone: [phone] Fax : [phone]  Primary Care Physician:  Valerie Roys, DO Primary Gastroenterologist:  Dr. Allen Norris  Pre-Procedure History & Physical: HPI:  KIMBERLY FLOW is a 54 y.o. female is here for a screening colonoscopy.   Past Medical History:  Diagnosis Date  . Adjustment disorder with depressed mood   . Anxiety   . Cervical cancer (HCC)    precancerous cells  . Chest pain   . Constipation   . Degenerative disc disease, cervical   . Dysplastic nevus 08/27/2013   s/p resection by Dr. Aubery Lapping.  . Dysthymic disorder   . Headache    from pinched nerve in neck  . History of ectopic pregnancy    Multiple  . IBS (irritable bowel syndrome)    Constipation; Rx for Amitiza worked well.  . Interstitial cystitis    [facility]; s/p bladder biopsy; s/p cystoscopy x 3 in 2015.  [name] Kidney stones   . Liver function study, abnormal   . Neck stiffness    limited left turn  . Pneumonia   . PONV (postoperative nausea and vomiting)   . Pyelonephritis   . Recurrent UTI   . Splenomegaly    s/p GI consult and hematology consult  . Ulcer 09/28/2011   EGD: +gastric ulcer; rx for Dexilant.  [name].  . Vertigo     Past Surgical History:  Procedure Laterality Date  . ABDOMINAL EXPLORATION SURGERY  1997   ruptured tube from eptopic pregnancy  . ABDOMINAL HYSTERECTOMY  08/27/1998   cervical dysplasia;one ovary remaining  . CESAREAN SECTION     x 2.  . COLONOSCOPY  08/27/2010   [name].    . CYSTOSTOMY W/ BLADDER BIOPSY  05/27/2014   [name]. Benign.  . ESOPHAGOGASTRODUODENOSCOPY  09/28/2011   +gastric ulcer.  [name].  . TONSILLECTOMY  05/27/2014   [place].    Prior to Admission medications   Medication Sig Start Date End Date Taking? Authorizing Provider  Ascorbic Acid (VITAMIN C PO) Take by mouth daily.   Yes [provider]  Black Elderberry,Berry-Flower, 575 MG CAPS Take by mouth  daily.   Yes [provider]  CASCARA SAGRADA PO Take by mouth.   Yes [provider]  Cyanocobalamin 2500 MCG SUBL Place under the tongue daily.    Yes [provider]  cyclobenzaprine (FLEXERIL) 5 MG tablet Take by mouth as needed.    Yes [provider]  diclofenac (VOLTAREN) 75 MG EC tablet Take 1 tablet (75 mg total) by mouth 2 (two) times daily as needed. 09/21/19  Yes Johnson, Megan P, DO  estrogens, conjugated, (PREMARIN) 0.625 MG tablet TAKE 1 TABLET (0.625 MG TOTAL) BY MOUTH DAILY. TAKE DAILY FOR 21 DAYS THEN DO NOT TAKE FOR 7 DAYS. 09/21/19  Yes Johnson, Megan P, DO  gabapentin (NEURONTIN) 100 MG capsule Take 1 capsule (100 mg total) by mouth at bedtime. 09/21/19  Yes Johnson, Megan P, DO  meclizine (ANTIVERT) 25 MG tablet Take 1 tablet (25 mg total) by mouth 3 (three) times daily as needed for dizziness. 01/26/19  Yes Volney American, [name]  Multiple Vitamin (MULTIVITAMIN) capsule Take by mouth daily.    Yes [provider]  RETIN-A MICRO PUMP 0.06 % GEL APPLY 1 APPLICATION ON THE SKIN AT NIGHT/BEDTIME 04/24/18  Yes [provider]  VITAMIN D PO Take by mouth daily. Gummies   Yes [provider]    Allergies as  of 09/22/2019 - Review Complete 09/21/2019  Allergen Reaction Noted  . Meloxicam Other (See Comments) 01/11/2015    Family History  Problem Relation Age of Onset  . Hypertension Mother   . COPD Mother   . Hyperlipidemia Mother   . Alcohol abuse Mother   . Colon polyps Mother   . Heart attack Mother   . Heart disease Mother 44       AMI x 2; stents.  PAD s/p stenting.  . Cancer Father        prostate cancer with mets  . Diabetes Father   . Heart disease Father 35       AMI age 58  . Hyperlipidemia Father   . Hypertension Father   . Mental illness Brother        paranoid schizophrenia  . Hypothyroidism Brother   . Hyperlipidemia Brother   . Stroke Maternal Grandmother   . Heart disease Maternal  Grandmother   . Hypertension Maternal Grandmother   . Stroke Maternal Grandfather   . Heart disease Maternal Grandfather   . Hypertension Maternal Grandfather   . Cancer Paternal Grandmother   . Heart disease Paternal Grandmother   . Diabetes Paternal Grandmother   . Breast cancer Paternal Grandmother   . Stroke Paternal Grandfather   . Heart disease Paternal Grandfather   . Cancer Brother 40       lung, germ cell; lobectomy at 54  . Drug abuse Brother   . Hypertension Brother   . Epilepsy Son   . Diabetes Son     Social History   Socioeconomic History  . Marital status: Married    Spouse name: Not on file  . Number of children: 2  . Years of education: 24  . Highest education level: Not on file  Occupational History  . Occupation: Oncologist: self employed  Tobacco Use  . Smoking status: Never Smoker  . Smokeless tobacco: Never Used  Substance and Sexual Activity  . Alcohol use: Yes    Alcohol/week: 0.0 standard drinks    Comment: 1-2 drinks per month  . Drug use: No  . Sexual activity: Yes    Partners: Male    Birth control/protection: Surgical  Other Topics Concern  . 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      Tobacco:  None      Alcohol: 1-2 servings per month.      Drugs:  none      Exercise:  none      Caffeine use: Coffee, 1 serving per day   Always uses seat belts, smoke alarm and carbon monoxide detector in the home, Guns locked up.   Organ Donor: YES   Patient DOES not have living will, DOES not have HCPOA.         Social Determinants of Health   Financial Resource Strain:   . Difficulty of Paying Living Expenses:   Food Insecurity:   . Worried About Charity fundraiser in the Last Year:   . Arboriculturist in the Last Year:     Transportation Needs:   . Film/video editor (Medical):   [name] Lack of Transportation (Non-Medical):   Physical Activity:   .  Days of Exercise per Week:   . Minutes of Exercise per Session:   Stress:   . Feeling of Stress :   Social Connections:   . Frequency of Communication with Friends and Family:   . Frequency of Social Gatherings with Friends and Family:   . Attends Religious Services:   . Active Member of Clubs or Organizations:   . Attends Archivist Meetings:   [name] Marital Status:   Intimate Partner Violence:   . Fear of Current or Ex-Partner:   . Emotionally Abused:   [name] Physically Abused:   . Sexually Abused:     Review of Systems: See HPI, otherwise negative ROS  Physical Exam: BP 134/79   Pulse 70   Temp (!) 97.5 F (36.4 C) (Temporal)   Ht 5\' 5"  (1.651 m)   Wt 68.9 kg   SpO2 100%   BMI 25.29 kg/m  General:   Alert,  pleasant and cooperative in NAD Head:  Normocephalic and atraumatic. Neck:  Supple; no masses or thyromegaly. Lungs:  Clear throughout to auscultation.    Heart:  Regular rate and rhythm. Abdomen:  Soft, nontender and nondistended. Normal bowel sounds, without guarding, and without rebound.   Neurologic:  Alert and  oriented x4;  grossly normal neurologically.  Impression/Plan: YIYI EDDINGTON is now here to undergo a screening colonoscopy.  Risks, benefits, and alternatives regarding colonoscopy have been reviewed with the patient.  Questions have been answered.  All parties agreeable.

## 2019-11-24 ENCOUNTER — Encounter: Payer: Self-pay | Admitting: *Deleted

## 2019-12-02 NOTE — Telephone Encounter (Signed)
Pt called back and states that her insurance will cover Estradiol by [employer].  Pt would like to know if she can have a 30 day script [facility] to local [facility] and 90 day sent to mail order. [id number] [place], [place] - [address] Phone:  [phone]  Fax:  [phone]    [facility] [place], [place] Phone:  [phone]  Fax:  [phone]

## 2019-12-02 NOTE — Telephone Encounter (Signed)
Routing to provider  

## 2019-12-03 MED ORDER — ESTRADIOL 0.06 MG/24HR TD PTWK: 1.0000 | MEDICATED_PATCH | TRANSDERMAL | 12 refills | Status: DC

## 2020-03-02 ENCOUNTER — Telehealth: Payer: Self-pay | Admitting: Family Medicine

## 2020-03-02 MED ORDER — ESTRADIOL 0.5 MG PO TABS: 0.5000 mg | ORAL_TABLET | Freq: Every day | ORAL | 0 refills | Status: DC

## 2020-03-02 NOTE — Telephone Encounter (Signed)
Estrogen tablet sent over at similar dose, follow up as scheduled with Dr. Wynetta Emery at end of month

## 2020-03-02 NOTE — Telephone Encounter (Signed)
estradiol (CLIMARA) 0.06 MG/24HR     Patient is requesting this medication be switched to pill form. She states that she does water aerobics and is having a hard time keeping patch on.    Pharmacy:  [facility], [place] Phone:  [phone]  Fax:  [phone]

## 2020-03-02 NOTE — Telephone Encounter (Signed)
Routing to provider  

## 2020-03-02 NOTE — Telephone Encounter (Signed)
Detailed VM left per DPR.

## 2020-03-08 DIAGNOSIS — R5382 Chronic fatigue, unspecified: Secondary | ICD-10-CM | POA: Diagnosis not present

## 2020-03-08 DIAGNOSIS — N958 Other specified menopausal and perimenopausal disorders: Secondary | ICD-10-CM | POA: Diagnosis not present

## 2020-03-17 DIAGNOSIS — M533 Sacrococcygeal disorders, not elsewhere classified: Secondary | ICD-10-CM | POA: Diagnosis not present

## 2020-03-21 ENCOUNTER — Ambulatory Visit: Payer: BC Managed Care – PPO | Admitting: Family Medicine

## 2020-03-22 ENCOUNTER — Ambulatory Visit (INDEPENDENT_AMBULATORY_CARE_PROVIDER_SITE_OTHER): Payer: BC Managed Care – PPO | Admitting: Family Medicine

## 2020-03-22 ENCOUNTER — Encounter: Payer: Self-pay | Admitting: Family Medicine

## 2020-03-22 ENCOUNTER — Other Ambulatory Visit: Payer: Self-pay

## 2020-03-22 VITALS — BP 120/84 | HR 91 | Temp 98.3°F | Wt 160.6 lb

## 2020-03-22 DIAGNOSIS — R232 Flushing: Secondary | ICD-10-CM

## 2020-03-22 DIAGNOSIS — I10 Essential (primary) hypertension: Secondary | ICD-10-CM

## 2020-03-22 DIAGNOSIS — E782 Mixed hyperlipidemia: Secondary | ICD-10-CM

## 2020-03-22 DIAGNOSIS — E663 Overweight: Secondary | ICD-10-CM

## 2020-03-22 DIAGNOSIS — N951 Menopausal and female climacteric states: Secondary | ICD-10-CM | POA: Diagnosis not present

## 2020-03-22 DIAGNOSIS — M2550 Pain in unspecified joint: Secondary | ICD-10-CM

## 2020-03-22 NOTE — Progress Notes (Signed)
BP 120/84 (BP Location: Left Arm, Patient Position: Sitting, Cuff Size: Normal)   Pulse 91   Temp 98.3 F (36.8 C) (Oral)   Wt 160 lb 9.6 oz (72.8 kg)   SpO2 99%   BMI 26.73 kg/m    Subjective:    Patient ID: Tracey Weaver, female    DOB: 07/06/1966, 54 y.o.   MRN: [id number]  HPI: Tracey Weaver is a 54 y.o. female  Chief Complaint  Patient presents with  . Hypertension  . Hyperlipidemia   HYPERTENSION / HYPERLIPIDEMIA Satisfied with current treatment? yes Duration of hypertension: chronic BP monitoring frequency: not checking BP medication side effects: no Past BP meds: none Duration of hyperlipidemia: chronic Cholesterol medication side effects: not on anything Cholesterol supplements: none Past cholesterol medications: none Medication compliance: n/a Aspirin: no Recent stressors: yes Recurrent headaches: no Visual changes: no Palpitations: no Dyspnea: no Chest pain: no Lower extremity edema: no Dizzy/lightheaded: no  MENOPAUSAL SYMPTOMS- feeling terrible. Tracey Weaver notes that the premarin was great and then Tracey Weaver got changed to the estradiol and started to feel a lot worse. Tracey Weaver has horrible symptoms and feeling unlike herself Duration: chronic Status: exacerbated Symptom severity: severe Hot flashes: yes Night sweats: yes Sleep disturbances: yes Vaginal dryness: no Dyspareunia:no Decreased libido: no Emotional lability: yes Stress incontinence: no Previous HRT/pharmacotherapy: yes Hysterectomy: yes Absolute Contraindications to Hormonal Therapy:     Undiagnosed vaginal bleeding: no    Breast cancer: no    Endometrial cancer: no    Coronary disease: no    Cerebrovascular disease: no    Venous thromboembolic disease: no  Relevant past medical, surgical, family and social history reviewed and updated as indicated. Interim medical history since our last visit reviewed. Allergies and medications reviewed and updated.  Review of Systems  Constitutional:  Positive for diaphoresis. Negative for activity change, appetite change, chills, fatigue, fever and unexpected weight change.  HENT: Negative.   Respiratory: Negative.   Cardiovascular: Negative.   Gastrointestinal: Negative.   Musculoskeletal: Negative.   Neurological: Negative.   Psychiatric/Behavioral: Negative.     Per HPI unless specifically indicated above     Objective:    BP 120/84 (BP Location: Left Arm, Patient Position: Sitting, Cuff Size: Normal)   Pulse 91   Temp 98.3 F (36.8 C) (Oral)   Wt 160 lb 9.6 oz (72.8 kg)   SpO2 99%   BMI 26.73 kg/m   Wt Readings from Last 3 Encounters:  03/22/20 160 lb 9.6 oz (72.8 kg)  11/23/19 152 lb (68.9 kg)  09/21/19 152 lb (68.9 kg)    Physical Exam Vitals and nursing note reviewed.  Constitutional:      General: Tracey Weaver is not in acute distress.    Appearance: Normal appearance. Tracey Weaver is not ill-appearing, toxic-appearing or diaphoretic.  HENT:     Head: Normocephalic and atraumatic.     Right Ear: External ear normal.     Left Ear: External ear normal.     Nose: Nose normal.     Mouth/Throat:     Mouth: Mucous membranes are moist.     Pharynx: Oropharynx is clear.  Eyes:     General: No scleral icterus.       Right eye: No discharge.        Left eye: No discharge.     Extraocular Movements: Extraocular movements intact.     Conjunctiva/sclera: Conjunctivae normal.     Pupils: Pupils are equal, round, and reactive to light.  Cardiovascular:  Rate and Rhythm: Normal rate and regular rhythm.     Pulses: Normal pulses.     Heart sounds: Normal heart sounds. No murmur heard.  No friction rub. No gallop.   Pulmonary:     Effort: Pulmonary effort is normal. No respiratory distress.     Breath sounds: Normal breath sounds. No stridor. No wheezing, rhonchi or rales.  Chest:     Chest wall: No tenderness.  Musculoskeletal:        General: Normal range of motion.     Cervical back: Normal range of motion and neck supple.    Skin:    General: Skin is warm and dry.     Capillary Refill: Capillary refill takes less than 2 seconds.     Coloration: Skin is not jaundiced or pale.     Findings: No bruising, erythema, lesion or rash.  Neurological:     General: No focal deficit present.     Mental Status: Tracey Weaver is alert and oriented to person, place, and time. Mental status is at baseline.  Psychiatric:        Mood and Affect: Mood normal.        Behavior: Behavior normal.        Thought Content: Thought content normal.        Judgment: Judgment normal.     Results for orders placed or performed in visit on 03/22/20  Comprehensive metabolic panel  Result Value Ref Range   Glucose 100 (H) 65 - 99 mg/dL   BUN 17 6 - 24 mg/dL   Creatinine, Ser 0.58 0.57 - 1.00 mg/dL   GFR calc non Af Amer 106 >59 mL/min/1.73   GFR calc Af Amer 122 >59 mL/min/1.73   BUN/Creatinine Ratio 29 (H) 9 - 23   Sodium 139 134 - 144 mmol/L   Potassium 4.0 3.5 - 5.2 mmol/L   Chloride 100 96 - 106 mmol/L   CO2 25 20 - 29 mmol/L   Calcium 9.2 8.7 - 10.2 mg/dL   Total Protein 6.7 6.0 - 8.5 g/dL   Albumin 4.5 3.8 - 4.9 g/dL   Globulin, Total 2.2 1.5 - 4.5 g/dL   Albumin/Globulin Ratio 2.0 1.2 - 2.2   Bilirubin Total 0.6 0.0 - 1.2 mg/dL   Alkaline Phosphatase 248 (H) 48 - 121 IU/L   AST 65 (H) 0 - 40 IU/L   ALT 212 (H) 0 - 32 IU/L  Lipid Panel w/o Chol/HDL Ratio  Result Value Ref Range   Cholesterol, Total 218 (H) 100 - 199 mg/dL   Triglycerides 295 (H) 0 - 149 mg/dL   HDL 56 >39 mg/dL   VLDL Cholesterol Cal 51 (H) 5 - 40 mg/dL   LDL Chol Calc (NIH) 111 (H) 0 - 99 mg/dL  Microalbumin, Urine Waived  Result Value Ref Range   Microalb, Ur Waived 30 (H) 0 - 19 mg/L   Creatinine, Urine Waived 200 10 - 300 mg/dL   Microalb/Creat Ratio <30 <30 mg/g  LH  Result Value Ref Range   LH 26.5 mIU/mL  FSH  Result Value Ref Range   FSH 31.6 mIU/mL  Estradiol  Result Value Ref Range   Estradiol 27.2 pg/mL  Testosterone, free,  total([facility])  Result Value Ref Range   Testosterone <3 (L) 4 - 50 ng/dL   Testosterone, Free WILL FOLLOW    Sex Hormone Binding 55.9 17.3 - 125.0 nmol/L      Assessment & Plan:   Problem List Items Addressed This Visit  Cardiovascular and Mediastinum   Hot flashes    Not under good control. Will increase her estradiol and recheck 1 month. Call with any concerns. If still not doing well, may need to do PA to see if we can get her back on premarin.       Hypertension - Primary    Under good control off medicine. Will continue to monitor. Call with any concerns.       Relevant Orders   Comprehensive metabolic panel (Completed)   Microalbumin, Urine Waived (Completed)     Other   Mixed hyperlipidemia    Rechecking labs today. Await results. Treat as needed.       Relevant Orders   Comprehensive metabolic panel (Completed)   Lipid Panel w/o Chol/HDL Ratio (Completed)    Other Visit Diagnoses    Menopausal symptom       Not under good control. Will increase her estradiol and recheck 1 month. Call with any concerns. If still not doing well, may need to do PA for premarin   Relevant Orders   LH (Completed)   FSH (Completed)   Estradiol (Completed)   Testosterone, free, total([facility]) (Completed)   Multiple joint pain       Doing well with gabapentin and voltaren. Refills given today.   Relevant Medications   diclofenac (VOLTAREN) 75 MG EC tablet   Overweight       Would like to get back into bariatric. Referral generated today. Call with any concerns.    Relevant Orders   Amb Referral to Bariatric Surgery       Follow up plan: Return in about 4 weeks (around 04/19/2020).

## 2020-03-23 ENCOUNTER — Encounter: Payer: Self-pay | Admitting: Family Medicine

## 2020-03-23 LAB — MICROALBUMIN, URINE WAIVED
Creatinine, Urine Waived: 200 mg/dL (ref 10–300)
Microalb, Ur Waived: 30 mg/L — ABNORMAL HIGH (ref 0–19)
Microalb/Creat Ratio: <30 mg/g (ref <30)

## 2020-03-23 MED ORDER — DICLOFENAC SODIUM 75 MG PO TBEC: 75.0000 mg | DELAYED_RELEASE_TABLET | Freq: Two times a day (BID) | ORAL | 1 refills | Status: DC | PRN

## 2020-03-23 MED ORDER — ESTRADIOL 0.5 MG PO TABS: 0.7500 mg | ORAL_TABLET | Freq: Every day | ORAL | 3 refills | Status: DC

## 2020-03-23 MED ORDER — GABAPENTIN 100 MG PO CAPS: 100.0000 mg | ORAL_CAPSULE | Freq: Every day | ORAL | 1 refills | Status: DC

## 2020-03-23 NOTE — Assessment & Plan Note (Signed)
Under good control off medicine. Will continue to monitor. Call with any concerns.

## 2020-03-24 ENCOUNTER — Other Ambulatory Visit: Payer: Self-pay | Admitting: Family Medicine

## 2020-03-24 ENCOUNTER — Encounter: Payer: Self-pay | Admitting: Family Medicine

## 2020-03-24 DIAGNOSIS — R748 Abnormal levels of other serum enzymes: Secondary | ICD-10-CM

## 2020-03-24 NOTE — Assessment & Plan Note (Signed)
Rechecking labs today. Await results. Treat as needed.  °

## 2020-03-24 NOTE — Assessment & Plan Note (Signed)
Not under good control. Will increase her estradiol and recheck 1 month. Call with any concerns. If still not doing well, may need to do PA to see if we can get her back on premarin.

## 2020-03-25 LAB — LIPID PANEL W/O CHOL/HDL RATIO
Cholesterol, Total: 218 mg/dL — ABNORMAL HIGH (ref 100–199)
HDL: 56 mg/dL (ref >39)
LDL Chol Calc (NIH): 111 mg/dL — ABNORMAL HIGH (ref 0–99)
Triglycerides: 295 mg/dL — ABNORMAL HIGH (ref 0–149)
VLDL Cholesterol Cal: 51 mg/dL — ABNORMAL HIGH (ref 5–40)

## 2020-03-25 LAB — TESTOSTERONE, FREE, TOTAL, SHBG
Sex Hormone Binding: 55.9 nmol/L (ref 17.3–125.0)
Testosterone, Free: 0.5 pg/mL (ref 0.0–4.2)
Testosterone: <3 ng/dL — ABNORMAL LOW (ref 4–50)

## 2020-03-25 LAB — COMPREHENSIVE METABOLIC PANEL
ALT: 212 IU/L — ABNORMAL HIGH (ref 0–32)
AST: 65 IU/L — ABNORMAL HIGH (ref 0–40)
Albumin/Globulin Ratio: 2 (ref 1.2–2.2)
Albumin: 4.5 g/dL (ref 3.8–4.9)
Alkaline Phosphatase: 248 IU/L — ABNORMAL HIGH (ref 48–121)
BUN/Creatinine Ratio: 29 — ABNORMAL HIGH (ref 9–23)
BUN: 17 mg/dL (ref 6–24)
Bilirubin Total: 0.6 mg/dL (ref 0.0–1.2)
CO2: 25 mmol/L (ref 20–29)
Calcium: 9.2 mg/dL (ref 8.7–10.2)
Chloride: 100 mmol/L (ref 96–106)
Creatinine, Ser: 0.58 mg/dL (ref 0.57–1.00)
GFR calc Af Amer: 122 mL/min/{1.73_m2} (ref >59)
GFR calc non Af Amer: 106 mL/min/{1.73_m2} (ref >59)
Globulin, Total: 2.2 g/dL (ref 1.5–4.5)
Glucose: 100 mg/dL — ABNORMAL HIGH (ref 65–99)
Potassium: 4 mmol/L (ref 3.5–5.2)
Sodium: 139 mmol/L (ref 134–144)
Total Protein: 6.7 g/dL (ref 6.0–8.5)

## 2020-03-25 LAB — LUTEINIZING HORMONE: LH: 26.5 m[IU]/mL

## 2020-03-25 LAB — ESTRADIOL: Estradiol: 27.2 pg/mL

## 2020-03-25 LAB — FOLLICLE STIMULATING HORMONE: FSH: 31.6 m[IU]/mL

## 2020-03-26 DIAGNOSIS — Z20822 Contact with and (suspected) exposure to covid-19: Secondary | ICD-10-CM | POA: Diagnosis not present

## 2020-04-07 ENCOUNTER — Other Ambulatory Visit: Payer: Self-pay

## 2020-04-07 ENCOUNTER — Other Ambulatory Visit: Payer: BC Managed Care – PPO

## 2020-04-07 DIAGNOSIS — R748 Abnormal levels of other serum enzymes: Secondary | ICD-10-CM | POA: Diagnosis not present

## 2020-04-08 ENCOUNTER — Other Ambulatory Visit: Payer: Self-pay | Admitting: Family Medicine

## 2020-04-08 DIAGNOSIS — R945 Abnormal results of liver function studies: Secondary | ICD-10-CM

## 2020-04-08 DIAGNOSIS — R7989 Other specified abnormal findings of blood chemistry: Secondary | ICD-10-CM

## 2020-04-08 LAB — COMPREHENSIVE METABOLIC PANEL
ALT: 73 IU/L — ABNORMAL HIGH (ref 0–32)
AST: 44 IU/L — ABNORMAL HIGH (ref 0–40)
Albumin/Globulin Ratio: 1.6 (ref 1.2–2.2)
Albumin: 3.8 g/dL (ref 3.8–4.9)
Alkaline Phosphatase: 235 IU/L — ABNORMAL HIGH (ref 48–121)
BUN/Creatinine Ratio: 25 — ABNORMAL HIGH (ref 9–23)
BUN: 16 mg/dL (ref 6–24)
Bilirubin Total: 0.4 mg/dL (ref 0.0–1.2)
CO2: 24 mmol/L (ref 20–29)
Calcium: 9 mg/dL (ref 8.7–10.2)
Chloride: 104 mmol/L (ref 96–106)
Creatinine, Ser: 0.64 mg/dL (ref 0.57–1.00)
GFR calc Af Amer: 118 mL/min/{1.73_m2} (ref >59)
GFR calc non Af Amer: 102 mL/min/{1.73_m2} (ref >59)
Globulin, Total: 2.4 g/dL (ref 1.5–4.5)
Glucose: 107 mg/dL — ABNORMAL HIGH (ref 65–99)
Potassium: 4.1 mmol/L (ref 3.5–5.2)
Sodium: 141 mmol/L (ref 134–144)
Total Protein: 6.2 g/dL (ref 6.0–8.5)

## 2020-04-08 LAB — GAMMA GT: GGT: 268 IU/L — ABNORMAL HIGH (ref 0–60)

## 2020-04-08 LAB — HEPATITIS PANEL, ACUTE
Hep A IgM: NEGATIVE
Hep B C IgM: NEGATIVE
Hep C Virus Ab: <0.1 s/co ratio (ref 0.0–0.9)
Hepatitis B Surface Ag: NEGATIVE

## 2020-04-08 LAB — CMV IGM: CMV IgM Ser EIA-aCnc: >240 AU/mL — ABNORMAL HIGH (ref 0.0–29.9)

## 2020-04-25 ENCOUNTER — Other Ambulatory Visit: Payer: Self-pay | Admitting: Family Medicine

## 2020-04-25 NOTE — Telephone Encounter (Signed)
   Notes to clinic:  REQUEST FOR 90 DAYS PRESCRIPTION   Requested Prescriptions  Pending Prescriptions Disp Refills   estradiol (ESTRACE) 0.5 MG tablet [Pharmacy Med Name: ESTRADIOL 0.5 MG TABLET] 135 tablet 2    Sig: Take 1.5 tablets (0.75 mg total) by mouth daily.      OB/GYN:  Estrogens Failed - 04/25/2020  8:31 AM      Failed - Mammogram is up-to-date per Health Maintenance      Passed - Last BP in normal range    BP Readings from Last 1 Encounters:  03/22/20 120/84          Passed - Valid encounter within last 12 months    Recent Outpatient Visits           1 month ago Essential hypertension   [place], Megan P, DO   7 months ago Routine general medical examination at a health care facility   [place], [place], DO   1 year ago Tinea corporis   [facility] Marnee Guarneri T, NP   1 year ago Dizziness   [facility] Volney American, [name]   1 year ago Essential hypertension   [place], [place], DO       Future Appointments             In 5 months Wynetta Emery, Barb Merino, DO [name], [facility]

## 2020-05-04 DIAGNOSIS — Z20822 Contact with and (suspected) exposure to covid-19: Secondary | ICD-10-CM | POA: Diagnosis not present

## 2020-05-09 ENCOUNTER — Other Ambulatory Visit: Payer: BC Managed Care – PPO

## 2020-05-09 ENCOUNTER — Other Ambulatory Visit: Payer: Self-pay

## 2020-05-09 DIAGNOSIS — R945 Abnormal results of liver function studies: Secondary | ICD-10-CM | POA: Diagnosis not present

## 2020-05-09 DIAGNOSIS — R7989 Other specified abnormal findings of blood chemistry: Secondary | ICD-10-CM

## 2020-05-10 LAB — COMPREHENSIVE METABOLIC PANEL
ALT: 40 IU/L — ABNORMAL HIGH (ref 0–32)
AST: 27 IU/L (ref 0–40)
Albumin/Globulin Ratio: 1.8 (ref 1.2–2.2)
Albumin: 4.2 g/dL (ref 3.8–4.9)
Alkaline Phosphatase: 84 IU/L (ref 44–121)
BUN/Creatinine Ratio: 30 — ABNORMAL HIGH (ref 9–23)
BUN: 17 mg/dL (ref 6–24)
Bilirubin Total: 0.4 mg/dL (ref 0.0–1.2)
CO2: 24 mmol/L (ref 20–29)
Calcium: 9.2 mg/dL (ref 8.7–10.2)
Chloride: 104 mmol/L (ref 96–106)
Creatinine, Ser: 0.57 mg/dL (ref 0.57–1.00)
GFR calc Af Amer: 122 mL/min/{1.73_m2} (ref >59)
GFR calc non Af Amer: 106 mL/min/{1.73_m2} (ref >59)
Globulin, Total: 2.4 g/dL (ref 1.5–4.5)
Glucose: 76 mg/dL (ref 65–99)
Potassium: 4.1 mmol/L (ref 3.5–5.2)
Sodium: 140 mmol/L (ref 134–144)
Total Protein: 6.6 g/dL (ref 6.0–8.5)

## 2020-05-31 DIAGNOSIS — M542 Cervicalgia: Secondary | ICD-10-CM | POA: Diagnosis not present

## 2020-05-31 DIAGNOSIS — M5031 Other cervical disc degeneration,  high cervical region: Secondary | ICD-10-CM | POA: Diagnosis not present

## 2020-05-31 DIAGNOSIS — M47812 Spondylosis without myelopathy or radiculopathy, cervical region: Secondary | ICD-10-CM | POA: Diagnosis not present

## 2020-05-31 DIAGNOSIS — G5622 Lesion of ulnar nerve, left upper limb: Secondary | ICD-10-CM | POA: Diagnosis not present

## 2020-05-31 DIAGNOSIS — Z79899 Other long term (current) drug therapy: Secondary | ICD-10-CM | POA: Diagnosis not present

## 2020-05-31 DIAGNOSIS — M5030 Other cervical disc degeneration, unspecified cervical region: Secondary | ICD-10-CM | POA: Diagnosis not present

## 2020-05-31 DIAGNOSIS — Z8542 Personal history of malignant neoplasm of other parts of uterus: Secondary | ICD-10-CM | POA: Diagnosis not present

## 2020-05-31 DIAGNOSIS — M7912 Myalgia of auxiliary muscles, head and neck: Secondary | ICD-10-CM | POA: Diagnosis not present

## 2020-05-31 DIAGNOSIS — Z79890 Hormone replacement therapy (postmenopausal): Secondary | ICD-10-CM | POA: Diagnosis not present

## 2020-05-31 DIAGNOSIS — Z6825 Body mass index (BMI) 25.0-25.9, adult: Secondary | ICD-10-CM | POA: Diagnosis not present

## 2020-05-31 DIAGNOSIS — G8929 Other chronic pain: Secondary | ICD-10-CM | POA: Diagnosis not present

## 2020-06-20 DIAGNOSIS — M4802 Spinal stenosis, cervical region: Secondary | ICD-10-CM | POA: Diagnosis not present

## 2020-06-20 DIAGNOSIS — M4312 Spondylolisthesis, cervical region: Secondary | ICD-10-CM | POA: Diagnosis not present

## 2020-06-20 DIAGNOSIS — G8929 Other chronic pain: Secondary | ICD-10-CM | POA: Diagnosis not present

## 2020-06-20 DIAGNOSIS — M542 Cervicalgia: Secondary | ICD-10-CM | POA: Diagnosis not present

## 2020-06-20 DIAGNOSIS — M47812 Spondylosis without myelopathy or radiculopathy, cervical region: Secondary | ICD-10-CM | POA: Diagnosis not present

## 2020-06-27 DIAGNOSIS — M542 Cervicalgia: Secondary | ICD-10-CM | POA: Diagnosis not present

## 2020-07-04 DIAGNOSIS — M542 Cervicalgia: Secondary | ICD-10-CM | POA: Diagnosis not present

## 2020-07-18 DIAGNOSIS — M47812 Spondylosis without myelopathy or radiculopathy, cervical region: Secondary | ICD-10-CM | POA: Diagnosis not present

## 2020-07-18 DIAGNOSIS — Z6826 Body mass index (BMI) 26.0-26.9, adult: Secondary | ICD-10-CM | POA: Diagnosis not present

## 2020-07-18 DIAGNOSIS — G5622 Lesion of ulnar nerve, left upper limb: Secondary | ICD-10-CM | POA: Diagnosis not present

## 2020-07-18 DIAGNOSIS — G4486 Cervicogenic headache: Secondary | ICD-10-CM | POA: Diagnosis not present

## 2020-07-29 ENCOUNTER — Other Ambulatory Visit: Payer: Self-pay

## 2020-07-29 ENCOUNTER — Ambulatory Visit
Admission: RE | Admit: 2020-07-29 | Discharge: 2020-07-29 | Disposition: A | Discharge status: Home / Self Care | Payer: BC Managed Care – PPO | Source: Ambulatory Visit | Attending: Family Medicine | Admitting: Family Medicine

## 2020-07-29 DIAGNOSIS — Z1231 Encounter for screening mammogram for malignant neoplasm of breast: Secondary | ICD-10-CM

## 2020-08-01 ENCOUNTER — Other Ambulatory Visit: Payer: Self-pay | Admitting: Family Medicine

## 2020-08-01 DIAGNOSIS — N644 Mastodynia: Secondary | ICD-10-CM

## 2020-08-04 ENCOUNTER — Other Ambulatory Visit: Payer: Self-pay | Admitting: Family Medicine

## 2020-08-04 NOTE — Telephone Encounter (Signed)
Patient last seen 03/22/20 and has appointment 10/05/20. Mammogram is scheduled for tomorrow.

## 2020-08-05 ENCOUNTER — Ambulatory Visit
Admission: RE | Admit: 2020-08-05 | Discharge: 2020-08-05 | Disposition: A | Discharge status: Home / Self Care | Payer: BC Managed Care – PPO | Source: Ambulatory Visit | Attending: Family Medicine | Admitting: Family Medicine

## 2020-08-05 ENCOUNTER — Other Ambulatory Visit: Payer: Self-pay

## 2020-08-05 DIAGNOSIS — N644 Mastodynia: Secondary | ICD-10-CM

## 2020-08-05 DIAGNOSIS — N6489 Other specified disorders of breast: Secondary | ICD-10-CM | POA: Diagnosis not present

## 2020-08-05 DIAGNOSIS — R922 Inconclusive mammogram: Secondary | ICD-10-CM | POA: Diagnosis not present

## 2020-08-19 ENCOUNTER — Other Ambulatory Visit: Payer: Self-pay | Admitting: Family Medicine

## 2020-08-21 NOTE — Telephone Encounter (Signed)
Last addressed 7/21 and mammogram 12/21 Requested Prescriptions  Pending Prescriptions Disp Refills  . estradiol (ESTRACE) 0.5 MG tablet [Pharmacy Med Name: ESTRADIOL 0.5 MG TABLET] 135 tablet 2    Sig: TAKE 1.5 TABLETS (0.75 MG TOTAL) BY MOUTH DAILY.     OB/GYN:  Estrogens Passed - 08/19/2020 12:10 PM      Passed - Mammogram is up-to-date per Health Maintenance      Passed - Last BP in normal range    BP Readings from Last 1 Encounters:  03/22/20 120/84         Passed - Valid encounter within last 12 months    Recent Outpatient Visits          5 months ago Essential hypertension   [place], Megan P, DO   11 months ago Routine general medical examination at a health care facility   [place], [place], DO   1 year ago Tinea corporis   [facility] Marnee Guarneri T, NP   1 year ago Dizziness   [facility] Volney American, [name]   1 year ago Essential hypertension   [place], [place], DO      Future Appointments            In 1 month Johnson, Barb Merino, DO [name], [facility]

## 2020-08-30 ENCOUNTER — Other Ambulatory Visit: Payer: Self-pay | Admitting: Family Medicine

## 2020-10-04 ENCOUNTER — Other Ambulatory Visit: Payer: Self-pay | Admitting: Family Medicine

## 2020-10-05 ENCOUNTER — Other Ambulatory Visit (HOSPITAL_COMMUNITY)
Admission: RE | Admit: 2020-10-05 | Discharge: 2020-10-05 | Disposition: A | Discharge status: Home / Self Care | Payer: BC Managed Care – PPO | Source: Ambulatory Visit | Attending: Family Medicine | Admitting: Family Medicine

## 2020-10-05 ENCOUNTER — Other Ambulatory Visit: Payer: Self-pay

## 2020-10-05 ENCOUNTER — Encounter: Payer: Self-pay | Admitting: Family Medicine

## 2020-10-05 ENCOUNTER — Ambulatory Visit (INDEPENDENT_AMBULATORY_CARE_PROVIDER_SITE_OTHER): Payer: BC Managed Care – PPO | Admitting: Family Medicine

## 2020-10-05 VITALS — BP 123/83 | HR 81 | Temp 98.5°F | Ht 66.0 in | Wt 166.2 lb

## 2020-10-05 DIAGNOSIS — Z124 Encounter for screening for malignant neoplasm of cervix: Secondary | ICD-10-CM | POA: Diagnosis not present

## 2020-10-05 DIAGNOSIS — Z0000 Encounter for general adult medical examination without abnormal findings: Secondary | ICD-10-CM | POA: Diagnosis not present

## 2020-10-05 DIAGNOSIS — M2550 Pain in unspecified joint: Secondary | ICD-10-CM | POA: Diagnosis not present

## 2020-10-05 LAB — URINALYSIS, ROUTINE W REFLEX MICROSCOPIC
Bilirubin, UA: NEGATIVE
Glucose, UA: NEGATIVE
Ketones, UA: NEGATIVE
Leukocytes,UA: NEGATIVE
Nitrite, UA: POSITIVE — AB
Protein,UA: NEGATIVE
RBC, UA: NEGATIVE
Specific Gravity, UA: >1.03 — ABNORMAL HIGH (ref 1.005–1.030)
Urobilinogen, Ur: 0.2 mg/dL (ref 0.2–1.0)
pH, UA: 5.5 (ref 5.0–7.5)

## 2020-10-05 LAB — MICROSCOPIC EXAMINATION: RBC, Urine: NONE SEEN /hpf (ref 0–2)

## 2020-10-05 MED ORDER — DICLOFENAC SODIUM 75 MG PO TBEC: 75.0000 mg | DELAYED_RELEASE_TABLET | Freq: Two times a day (BID) | ORAL | 3 refills | Status: DC | PRN

## 2020-10-05 MED ORDER — ESTRADIOL 0.5 MG PO TABS: 0.7500 mg | ORAL_TABLET | Freq: Every day | ORAL | 0 refills | Status: DC

## 2020-10-05 MED ORDER — GABAPENTIN 100 MG PO CAPS: 100.0000 mg | ORAL_CAPSULE | Freq: Every day | ORAL | 3 refills | Status: DC

## 2020-10-05 NOTE — Patient Instructions (Signed)
Health Maintenance, Female Adopting a healthy lifestyle and getting preventive care are important in promoting health and wellness. Ask your health care provider about:  The right schedule for you to have regular tests and exams.  Things you can do on your own to prevent diseases and keep yourself healthy. What should I know about diet, weight, and exercise? Eat a healthy diet  Eat a diet that includes plenty of vegetables, fruits, low-fat dairy products, and lean protein.  Do not eat a lot of foods that are high in solid fats, added sugars, or sodium.   Maintain a healthy weight Body mass index (BMI) is used to identify weight problems. It estimates body fat based on height and weight. Your health care provider can help determine your BMI and help you achieve or maintain a healthy weight. Get regular exercise Get regular exercise. This is one of the most important things you can do for your health. Most adults should:  Exercise for at least 150 minutes each week. The exercise should increase your heart rate and make you sweat (moderate-intensity exercise).  Do strengthening exercises at least twice a week. This is in addition to the moderate-intensity exercise.  Spend less time sitting. Even light physical activity can be beneficial. Watch cholesterol and blood lipids Have your blood tested for lipids and cholesterol at 55 years of age, then have this test every 5 years. Have your cholesterol levels checked more often if:  Your lipid or cholesterol levels are high.  You are older than 55 years of age.  You are at high risk for heart disease. What should I know about cancer screening? Depending on your health history and family history, you may need to have cancer screening at various ages. This may include screening for:  Breast cancer.  Cervical cancer.  Colorectal cancer.  Skin cancer.  Lung cancer. What should I know about heart disease, diabetes, and high blood  pressure? Blood pressure and heart disease  High blood pressure causes heart disease and increases the risk of stroke. This is more likely to develop in people who have high blood pressure readings, are of African descent, or are overweight.  Have your blood pressure checked: ? Every 3-5 years if you are 18-39 years of age. ? Every year if you are 40 years old or older. Diabetes Have regular diabetes screenings. This checks your fasting blood sugar level. Have the screening done:  Once every three years after age 40 if you are at a normal weight and have a low risk for diabetes.  More often and at a younger age if you are overweight or have a high risk for diabetes. What should I know about preventing infection? Hepatitis B If you have a higher risk for hepatitis B, you should be screened for this virus. Talk with your health care provider to find out if you are at risk for hepatitis B infection. Hepatitis C Testing is recommended for:  Everyone born from 1945 through 1965.  Anyone with known risk factors for hepatitis C. Sexually transmitted infections (STIs)  Get screened for STIs, including gonorrhea and chlamydia, if: ? You are sexually active and are younger than 55 years of age. ? You are older than 55 years of age and your health care provider tells you that you are at risk for this type of infection. ? Your sexual activity has changed since you were last screened, and you are at increased risk for chlamydia or gonorrhea. Ask your health care provider   if you are at risk.  Ask your health care provider about whether you are at high risk for HIV. Your health care provider may recommend a prescription medicine to help prevent HIV infection. If you choose to take medicine to prevent HIV, you should first get tested for HIV. You should then be tested every 3 months for as long as you are taking the medicine. Pregnancy  If you are about to stop having your period (premenopausal) and  you may become pregnant, seek counseling before you get pregnant.  Take 400 to 800 micrograms (mcg) of folic acid every day if you become pregnant.  Ask for birth control (contraception) if you want to prevent pregnancy. Osteoporosis and menopause Osteoporosis is a disease in which the bones lose minerals and strength with aging. This can result in bone fractures. If you are 65 years old or older, or if you are at risk for osteoporosis and fractures, ask your health care provider if you should:  Be screened for bone loss.  Take a calcium or vitamin D supplement to lower your risk of fractures.  Be given hormone replacement therapy (HRT) to treat symptoms of menopause. Follow these instructions at home: Lifestyle  Do not use any products that contain nicotine or tobacco, such as cigarettes, e-cigarettes, and chewing tobacco. If you need help quitting, ask your health care provider.  Do not use street drugs.  Do not share needles.  Ask your health care provider for help if you need support or information about quitting drugs. Alcohol use  Do not drink alcohol if: ? Your health care provider tells you not to drink. ? You are pregnant, may be pregnant, or are planning to become pregnant.  If you drink alcohol: ? Limit how much you use to 0-1 drink a day. ? Limit intake if you are breastfeeding.  Be aware of how much alcohol is in your drink. In the [place] one drink equals one 12 oz bottle of beer (355 mL), one 5 oz glass of wine (148 mL), or one 1 oz glass of hard liquor (44 mL). General instructions  Schedule regular health, dental, and eye exams.  Stay current with your vaccines.  Tell your health care provider if: ? You often feel depressed. ? You have ever been abused or do not feel safe at home. Summary  Adopting a healthy lifestyle and getting preventive care are important in promoting health and wellness.  Follow your health care provider's instructions about healthy  diet, exercising, and getting tested or screened for diseases.  Follow your health care provider's instructions on monitoring your cholesterol and blood pressure. This information is not intended to replace advice given to you by your health care provider. Make sure you discuss any questions you have with your health care provider. Document Revised: 08/06/2018 Document Reviewed: 08/06/2018 Elsevier Patient Education  2021 Elsevier Inc.  

## 2020-10-05 NOTE — Progress Notes (Signed)
BP 123/83   Pulse 81   Temp 98.5 F (36.9 C)   Ht 5\' 6"  (1.676 m)   Wt 166 lb 3.2 oz (75.4 kg)   SpO2 99%   BMI 26.83 kg/m    Subjective:    Patient ID: Tracey Weaver, female    DOB: 1966-04-04, 55 y.o.   MRN: [id number]  HPI: Tracey Weaver is a 55 y.o. female presenting on 10/05/2020 for comprehensive medical examination. Current medical complaints include: none  She currently lives with: husband Menopausal Symptoms: no  Depression Screen done today and results listed below:  Depression screen [facility] 2/9 10/05/2020 09/21/2019 11/21/2018 03/13/2018 11/13/2016  Decreased Interest 0 1 0 0 0  Down, Depressed, Hopeless 0 1 0 0 0  PHQ - 2 Score 0 2 0 0 0  Altered sleeping - 0 - - -  Tired, decreased energy - 1 - - -  Change in appetite - 0 - - -  Feeling bad or failure about yourself  - 1 - - -  Trouble concentrating - 2 - - -  Moving slowly or fidgety/restless - 1 - - -  Suicidal thoughts - 0 - - -  PHQ-9 Score - 7 - - -  Difficult doing work/chores - Not difficult at all - - -    Past Medical History:  Past Medical History:  Diagnosis Date  . Adjustment disorder with depressed mood   . Anxiety   . Cervical cancer (HCC)    precancerous cells  . Chest pain   . Constipation   . Degenerative disc disease, cervical   . Dysplastic nevus 08/27/2013   s/p resection by Dr. Aubery Lapping.  . Dysthymic disorder   . Headache    from pinched nerve in neck  . History of ectopic pregnancy    Multiple  . IBS (irritable bowel syndrome)    Constipation; Rx for Amitiza worked well.  . Interstitial cystitis    [facility]; s/p bladder biopsy; s/p cystoscopy x 3 in 2015.  [name] Kidney stones   . Liver function study, abnormal   . Neck stiffness    limited left turn  . Pneumonia   . PONV (postoperative nausea and vomiting)   . Pyelonephritis   . Recurrent UTI   . Splenomegaly    s/p GI consult and hematology consult  . Ulcer 09/28/2011   EGD: +gastric ulcer; rx for Dexilant.  [name].  .  Vertigo     Surgical History:  Past Surgical History:  Procedure Laterality Date  . ABDOMINAL EXPLORATION SURGERY  1997   ruptured tube from eptopic pregnancy  . ABDOMINAL HYSTERECTOMY  08/27/1998   cervical dysplasia;one ovary remaining  . CESAREAN SECTION     x 2.  . COLONOSCOPY  08/27/2010   [name].    . COLONOSCOPY WITH PROPOFOL N/A 11/23/2019   Procedure: COLONOSCOPY WITH PROPOFOL;  Surgeon: Lucilla Lame, MD;  Location: [facility];  Service: Endoscopy;  Laterality: N/A;  . CYSTOSTOMY W/ BLADDER BIOPSY  05/27/2014   [name]. Benign.  . ESOPHAGOGASTRODUODENOSCOPY  09/28/2011   +gastric ulcer.  [name].  . TONSILLECTOMY  05/27/2014   [facility].    Medications:  Current Outpatient Medications on File Prior to Visit  Medication Sig  . Ascorbic Acid (VITAMIN C PO) Take by mouth daily.  . Black Elderberry,Berry-Flower, 575 MG CAPS Take by mouth daily.  . CASCARA SAGRADA PO Take by mouth.  . Cyanocobalamin 2500 MCG SUBL Place under the tongue daily.   . cyclobenzaprine (  FLEXERIL) 5 MG tablet Take by mouth as needed.  . meclizine (ANTIVERT) 25 MG tablet Take 1 tablet (25 mg total) by mouth 3 (three) times daily as needed for dizziness.  . Multiple Vitamin (MULTIVITAMIN) capsule Take by mouth daily.   [name] RETIN-A MICRO PUMP 0.06 % GEL APPLY 1 APPLICATION ON THE SKIN AT NIGHT/BEDTIME  . VITAMIN D PO Take by mouth daily. Gummies   No current facility-administered medications on file prior to visit.    Allergies:  Allergies  Allergen Reactions  . Meloxicam Other (See Comments)    "MAKES ME FEEL FUNNY", Dizziness    Social History:  Social History   Socioeconomic History  . Marital status: Married    Spouse name: Not on file  . Number of children: 2  . Years of education: 18  . Highest education level: Not on file  Occupational History  . Occupation: Oncologist: self employed  Tobacco Use  . Smoking status: Never Smoker  . Smokeless tobacco: Never Used   Vaping Use  . Vaping Use: Never used  Substance and Sexual Activity  . Alcohol use: Yes    Alcohol/week: 0.0 standard drinks    Comment: 1-2 drinks per month  . Drug use: No  . Sexual activity: Yes    Partners: Male    Birth control/protection: Surgical  Other Topics Concern  . 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      Tobacco:  None      Alcohol: 1-2 servings per month.      Drugs:  none      Exercise:  none      Caffeine use: Coffee, 1 serving per day   Always uses seat belts, smoke alarm and carbon monoxide detector in the home, Guns locked up.   Organ Donor: YES   Patient DOES not have living will, DOES not have HCPOA.         Social Determinants of Health   Financial Resource Strain: Not on file  Food Insecurity: Not on file  Transportation Needs: Not on file  Physical Activity: Not on file  Stress: Not on file  Social Connections: Not on file  Intimate Partner Violence: Not on file   Social History   Tobacco Use  Smoking Status Never Smoker  Smokeless Tobacco Never Used   Social History   Substance and Sexual Activity  Alcohol Use Yes  . Alcohol/week: 0.0 standard drinks   Comment: 1-2 drinks per month    Family History:  Family History  Problem Relation Age of Onset  . Hypertension Mother   . COPD Mother   . Hyperlipidemia Mother   . Alcohol abuse Mother   . Colon polyps Mother   . Heart attack Mother   . Heart disease Mother 23       AMI x 2; stents.  PAD s/p stenting.  . Cancer Father        prostate cancer with mets  . Diabetes Father   . Heart disease Father 61       AMI age 43  . Hyperlipidemia Father   . Hypertension Father   . Mental illness Brother        paranoid schizophrenia  . Hypothyroidism  Brother   . Hyperlipidemia Brother   .  Stroke Maternal Grandmother   . Heart disease Maternal Grandmother   . Hypertension Maternal Grandmother   . Stroke Maternal Grandfather   . Heart disease Maternal Grandfather   . Hypertension Maternal Grandfather   . Cancer Paternal Grandmother   . Heart disease Paternal Grandmother   . Diabetes Paternal Grandmother   . Breast cancer Paternal Grandmother   . Stroke Paternal Grandfather   . Heart disease Paternal Grandfather   . Cancer Brother 40       lung, germ cell; lobectomy at 22  . Drug abuse Brother   . Hypertension Brother   . Epilepsy Son   . Diabetes Son     Past medical history, surgical history, medications, allergies, family history and social history reviewed with patient today and changes made to appropriate areas of the chart.   Review of Systems  Constitutional: Negative.   HENT: Positive for congestion. Negative for ear discharge, ear pain, hearing loss, nosebleeds, sinus pain, sore throat and tinnitus.   Eyes: Negative.   Respiratory: Negative.  Negative for stridor.   Cardiovascular: Negative.   Gastrointestinal: Negative.   Genitourinary: Negative.   Musculoskeletal: Negative.   Skin: Negative.   Neurological: Negative.   Endo/Heme/Allergies: Negative.   Psychiatric/Behavioral: Negative.    All other ROS negative except what is listed above and in the HPI.      Objective:    BP 123/83   Pulse 81   Temp 98.5 F (36.9 C)   Ht 5\' 6"  (1.676 m)   Wt 166 lb 3.2 oz (75.4 kg)   SpO2 99%   BMI 26.83 kg/m   Wt Readings from Last 3 Encounters:  10/05/20 166 lb 3.2 oz (75.4 kg)  03/22/20 160 lb 9.6 oz (72.8 kg)  11/23/19 152 lb (68.9 kg)    Physical Exam Vitals and nursing note reviewed. Exam conducted with a chaperone present.  Constitutional:      General: She is not in acute distress.    Appearance: Normal appearance. She is not ill-appearing, toxic-appearing or diaphoretic.  HENT:     Head: Normocephalic and atraumatic.     Right Ear:  Tympanic membrane, ear canal and external ear normal. There is no impacted cerumen.     Left Ear: Tympanic membrane, ear canal and external ear normal. There is no impacted cerumen.     Nose: Nose normal. No congestion or rhinorrhea.     Mouth/Throat:     Mouth: Mucous membranes are moist.     Pharynx: Oropharynx is clear. No oropharyngeal exudate or posterior oropharyngeal erythema.  Eyes:     General: No scleral icterus.       Right eye: No discharge.        Left eye: No discharge.     Extraocular Movements: Extraocular movements intact.     Conjunctiva/sclera: Conjunctivae normal.     Pupils: Pupils are equal, round, and reactive to light.  Neck:     Vascular: No carotid bruit.  Cardiovascular:     Rate and Rhythm: Normal rate and regular rhythm.     Pulses: Normal pulses.     Heart sounds: No murmur heard. No friction rub. No gallop.   Pulmonary:     Effort: Pulmonary effort is normal. No respiratory distress.     Breath sounds: Normal breath sounds. No stridor. No wheezing, rhonchi or rales.  Chest:     Chest wall: No tenderness.  Breasts:  Right: Normal.     Left: Normal.    Abdominal:     General: Abdomen is flat. Bowel sounds are normal. There is no distension.     Palpations: Abdomen is soft. There is no mass.     Tenderness: There is no abdominal tenderness. There is no right CVA tenderness, left CVA tenderness, guarding or rebound.     Hernia: No hernia is present.  Genitourinary:    General: Normal vulva.     Labia:        Right: No rash, tenderness, lesion or injury.        Left: No tenderness, lesion or injury.      Urethra: No prolapse, urethral pain, urethral swelling or urethral lesion.     Vagina: Normal. No vaginal discharge.     Cervix: Normal.  Musculoskeletal:        General: No swelling, tenderness, deformity or signs of injury.     Cervical back: Normal range of motion and neck supple. No rigidity. No muscular tenderness.     Right lower leg:  No edema.     Left lower leg: No edema.  Lymphadenopathy:     Cervical: No cervical adenopathy.  Skin:    General: Skin is warm and dry.     Capillary Refill: Capillary refill takes less than 2 seconds.     Coloration: Skin is not jaundiced or pale.     Findings: No bruising, erythema, lesion or rash.  Neurological:     General: No focal deficit present.     Mental Status: She is alert and oriented to person, place, and time. Mental status is at baseline.     Cranial Nerves: No cranial nerve deficit.     Sensory: No sensory deficit.     Motor: No weakness.     Coordination: Coordination normal.     Gait: Gait normal.     Deep Tendon Reflexes: Reflexes normal.  Psychiatric:        Mood and Affect: Mood normal.        Behavior: Behavior normal.        Thought Content: Thought content normal.        Judgment: Judgment normal.     Results for orders placed or performed in visit on 05/09/20  Comprehensive metabolic panel  Result Value Ref Range   Glucose 76 65 - 99 mg/dL   BUN 17 6 - 24 mg/dL   Creatinine, Ser 0.57 0.57 - 1.00 mg/dL   GFR calc non Af Amer 106 >59 mL/min/1.73   GFR calc Af Amer 122 >59 mL/min/1.73   BUN/Creatinine Ratio 30 (H) 9 - 23   Sodium 140 134 - 144 mmol/L   Potassium 4.1 3.5 - 5.2 mmol/L   Chloride 104 96 - 106 mmol/L   CO2 24 20 - 29 mmol/L   Calcium 9.2 8.7 - 10.2 mg/dL   Total Protein 6.6 6.0 - 8.5 g/dL   Albumin 4.2 3.8 - 4.9 g/dL   Globulin, Total 2.4 1.5 - 4.5 g/dL   Albumin/Globulin Ratio 1.8 1.2 - 2.2   Bilirubin Total 0.4 0.0 - 1.2 mg/dL   Alkaline Phosphatase 84 44 - 121 IU/L   AST 27 0 - 40 IU/L   ALT 40 (H) 0 - 32 IU/L      Assessment & Plan:   Problem List Items Addressed This Visit   None   Visit Diagnoses    Routine general medical examination at a health care facility    -  Primary   Vaccines up to date. Screening labs checked today. Pap done. Colonoscopy and mammogram up to date. Call with any concerns. Continue to monitor.     Relevant Orders   CBC with Differential/Platelet   Comprehensive metabolic panel   Lipid Panel w/o Chol/HDL Ratio   Urinalysis, Routine w reflex microscopic   TSH   Multiple joint pain       Doing well with gabapentin and voltaren. Refills given today.   Relevant Medications   diclofenac (VOLTAREN) 75 MG EC tablet   Screening for cervical cancer       Pap done. Await results. Call with any concerns.    Relevant Orders   Cytology - PAP       Follow up plan: Return in about 1 year (around 10/05/2021) for physical.   LABORATORY TESTING:  - Pap smear: pap done  IMMUNIZATIONS:   - Tdap: Tetanus vaccination status reviewed: last tetanus booster within 10 years. - Influenza: Refused - COVID: Up to date  SCREENING: -Mammogram: Up to date  - Colonoscopy: Up to date   PATIENT COUNSELING:   Advised to take 1 mg of folate supplement per day if capable of pregnancy.   Sexuality: Discussed sexually transmitted diseases, partner selection, use of condoms, avoidance of unintended pregnancy  and contraceptive alternatives.   Advised to avoid cigarette smoking.  I discussed with the patient that most people either abstain from alcohol or drink within safe limits (<=14/week and <=4 drinks/occasion for males, <=7/weeks and <= 3 drinks/occasion for females) and that the risk for alcohol disorders and other health effects rises proportionally with the number of drinks per week and how often a drinker exceeds daily limits.  Discussed cessation/primary prevention of drug use and availability of treatment for abuse.   Diet: Encouraged to adjust caloric intake to maintain  or achieve ideal body weight, to reduce intake of dietary saturated fat and total fat, to limit sodium intake by avoiding high sodium foods and not adding table salt, and to maintain adequate dietary potassium and calcium preferably from fresh fruits, vegetables, and low-fat dairy products.    stressed the importance of  regular exercise  Injury prevention: Discussed safety belts, safety helmets, smoke detector, smoking near bedding or upholstery.   Dental health: Discussed importance of regular tooth brushing, flossing, and dental visits.    NEXT PREVENTATIVE PHYSICAL DUE IN 1 YEAR. Return in about 1 year (around 10/05/2021) for physical.

## 2020-10-06 LAB — LIPID PANEL W/O CHOL/HDL RATIO
Cholesterol, Total: 234 mg/dL — ABNORMAL HIGH (ref 100–199)
HDL: 52 mg/dL (ref >39)
LDL Chol Calc (NIH): 140 mg/dL — ABNORMAL HIGH (ref 0–99)
Triglycerides: 235 mg/dL — ABNORMAL HIGH (ref 0–149)
VLDL Cholesterol Cal: 42 mg/dL — ABNORMAL HIGH (ref 5–40)

## 2020-10-06 LAB — TSH: TSH: 2.16 u[IU]/mL (ref 0.450–4.500)

## 2020-10-06 LAB — COMPREHENSIVE METABOLIC PANEL
ALT: 48 IU/L — ABNORMAL HIGH (ref 0–32)
AST: 20 IU/L (ref 0–40)
Albumin/Globulin Ratio: 2 (ref 1.2–2.2)
Albumin: 4.3 g/dL (ref 3.8–4.9)
Alkaline Phosphatase: 84 IU/L (ref 44–121)
BUN/Creatinine Ratio: 29 — ABNORMAL HIGH (ref 9–23)
BUN: 18 mg/dL (ref 6–24)
Bilirubin Total: 0.4 mg/dL (ref 0.0–1.2)
CO2: 22 mmol/L (ref 20–29)
Calcium: 9.4 mg/dL (ref 8.7–10.2)
Chloride: 105 mmol/L (ref 96–106)
Creatinine, Ser: 0.62 mg/dL (ref 0.57–1.00)
GFR calc Af Amer: 118 mL/min/{1.73_m2} (ref >59)
GFR calc non Af Amer: 103 mL/min/{1.73_m2} (ref >59)
Globulin, Total: 2.2 g/dL (ref 1.5–4.5)
Glucose: 83 mg/dL (ref 65–99)
Potassium: 4.3 mmol/L (ref 3.5–5.2)
Sodium: 143 mmol/L (ref 134–144)
Total Protein: 6.5 g/dL (ref 6.0–8.5)

## 2020-10-06 LAB — CBC WITH DIFFERENTIAL/PLATELET
Basophils Absolute: 0 10*3/uL (ref 0.0–0.2)
Basos: 1 %
EOS (ABSOLUTE): 0.1 10*3/uL (ref 0.0–0.4)
Eos: 3 %
Hematocrit: 40.9 % (ref 34.0–46.6)
Hemoglobin: 13.5 g/dL (ref 11.1–15.9)
Immature Grans (Abs): 0 10*3/uL (ref 0.0–0.1)
Immature Granulocytes: 0 %
Lymphocytes Absolute: 2.4 10*3/uL (ref 0.7–3.1)
Lymphs: 53 %
MCH: 31.2 pg (ref 26.6–33.0)
MCHC: 33 g/dL (ref 31.5–35.7)
MCV: 95 fL (ref 79–97)
Monocytes Absolute: 0.4 10*3/uL (ref 0.1–0.9)
Monocytes: 8 %
Neutrophils Absolute: 1.5 10*3/uL (ref 1.4–7.0)
Neutrophils: 35 %
Platelets: 256 10*3/uL (ref 150–450)
RBC: 4.33 x10E6/uL (ref 3.77–5.28)
RDW: 11.7 % (ref 11.7–15.4)
WBC: 4.4 10*3/uL (ref 3.4–10.8)

## 2020-10-11 LAB — CYTOLOGY - PAP
Adequacy: ABSENT
Comment: NEGATIVE
Diagnosis: NEGATIVE
High risk HPV: NEGATIVE

## 2020-11-16 ENCOUNTER — Telehealth: Payer: Self-pay

## 2020-11-16 NOTE — Telephone Encounter (Signed)
Copied from [place] [id number]. Topic: General - Other >> Nov 16, 2020  1:07 PM Tessa Lerner A wrote: Reason for CRM: Patient will be traveling 3/25 - 4/2 and has made contact requesting to be prescribed something for motion sickness patch  The patient's preferred pharmacy is [id number] - [place], [place] - [address]  Phone:  [phone]  Please contact further if needed

## 2020-11-17 MED ORDER — SCOPOLAMINE 1 MG/3DAYS TD PT72: 1.0000 | MEDICATED_PATCH | TRANSDERMAL | 12 refills | Status: DC

## 2020-11-18 DIAGNOSIS — Z20822 Contact with and (suspected) exposure to covid-19: Secondary | ICD-10-CM | POA: Diagnosis not present

## 2020-11-18 DIAGNOSIS — Z03818 Encounter for observation for suspected exposure to other biological agents ruled out: Secondary | ICD-10-CM | POA: Diagnosis not present

## 2021-01-16 DIAGNOSIS — B078 Other viral warts: Secondary | ICD-10-CM | POA: Diagnosis not present

## 2021-01-16 DIAGNOSIS — Z872 Personal history of diseases of the skin and subcutaneous tissue: Secondary | ICD-10-CM | POA: Diagnosis not present

## 2021-01-16 DIAGNOSIS — L578 Other skin changes due to chronic exposure to nonionizing radiation: Secondary | ICD-10-CM | POA: Diagnosis not present

## 2021-01-16 DIAGNOSIS — Z86018 Personal history of other benign neoplasm: Secondary | ICD-10-CM | POA: Diagnosis not present

## 2021-01-25 DIAGNOSIS — R0782 Intercostal pain: Secondary | ICD-10-CM | POA: Diagnosis not present

## 2021-01-25 DIAGNOSIS — J069 Acute upper respiratory infection, unspecified: Secondary | ICD-10-CM | POA: Diagnosis not present

## 2021-01-25 DIAGNOSIS — R059 Cough, unspecified: Secondary | ICD-10-CM | POA: Diagnosis not present

## 2021-01-29 ENCOUNTER — Other Ambulatory Visit: Payer: Self-pay | Admitting: Family Medicine

## 2021-01-29 NOTE — Telephone Encounter (Signed)
Requested Prescriptions  Pending Prescriptions Disp Refills  . estradiol (ESTRACE) 0.5 MG tablet [Pharmacy Med Name: ESTRADIOL 0.5 MG TABLET] 135 tablet 2    Sig: TAKE 1.5 TABLETS (0.75 MG TOTAL) BY MOUTH DAILY.     OB/GYN:  Estrogens Passed - 01/29/2021 12:52 AM      Passed - Mammogram is up-to-date per Health Maintenance      Passed - Last BP in normal range    BP Readings from Last 1 Encounters:  10/05/20 123/83         Passed - Valid encounter within last 12 months    Recent Outpatient Visits          3 months ago Routine general medical examination at a health care facility   [facility], [place], DO   10 months ago Essential hypertension   [place], Barb Merino, DO   1 year ago Routine general medical examination at a health care facility   [place], [place], DO   1 year ago Tinea corporis   [place], [name], NP   2 years ago [facility], [place], [name]

## 2021-02-25 ENCOUNTER — Other Ambulatory Visit: Payer: Self-pay | Admitting: Family Medicine

## 2021-02-25 NOTE — Telephone Encounter (Signed)
Last RF by Dr. Wynetta Emery 01/29/21 #135 with 2 RF

## 2021-03-29 ENCOUNTER — Other Ambulatory Visit: Payer: Self-pay | Admitting: Family Medicine

## 2021-03-29 NOTE — Telephone Encounter (Signed)
Requested medication (s) are due for refill today: Yes  Requested medication (s) are on the active medication list: Yes  Last refill:  01/29/21  Future visit scheduled: No  Notes to clinic:  Pharmacy sending for updated information, see highlighted on request, unable to refill per protocol.     Requested Prescriptions  Pending Prescriptions Disp Refills   estradiol (ESTRACE) 0.5 MG tablet [Pharmacy Med Name: ESTRADIOL 0.5 MG TABLET] 135 tablet 3    Sig: TAKE 1 & 1/2 TABLETS (0.75 MG TOTAL) BY MOUTH DAILY.      OB/GYN:  Estrogens Failed - 03/29/2021 11:32 AM      Failed - Mammogram is up-to-date per Health Maintenance      Passed - Last BP in normal range    BP Readings from Last 1 Encounters:  10/05/20 123/83          Passed - Valid encounter within last 12 months    Recent Outpatient Visits           5 months ago Routine general medical examination at a health care facility   [facility], [place], DO   1 year ago Essential hypertension   [facility] Valerie Roys, DO   1 year ago Routine general medical examination at a health care facility   [place], [place], DO   2 years ago Tinea corporis   [place], [name], NP   2 years ago [place], [place], [name]

## 2021-04-03 ENCOUNTER — Other Ambulatory Visit: Payer: Self-pay

## 2021-04-03 ENCOUNTER — Encounter: Payer: Self-pay | Admitting: Family Medicine

## 2021-04-03 ENCOUNTER — Ambulatory Visit (INDEPENDENT_AMBULATORY_CARE_PROVIDER_SITE_OTHER): Payer: BC Managed Care – PPO | Admitting: Family Medicine

## 2021-04-03 VITALS — BP 136/75 | HR 66 | Temp 97.9°F | Wt 167.8 lb

## 2021-04-03 DIAGNOSIS — R300 Dysuria: Secondary | ICD-10-CM | POA: Diagnosis not present

## 2021-04-03 DIAGNOSIS — B9689 Other specified bacterial agents as the cause of diseases classified elsewhere: Secondary | ICD-10-CM | POA: Diagnosis not present

## 2021-04-03 DIAGNOSIS — R42 Dizziness and giddiness: Secondary | ICD-10-CM | POA: Diagnosis not present

## 2021-04-03 DIAGNOSIS — N760 Acute vaginitis: Secondary | ICD-10-CM | POA: Diagnosis not present

## 2021-04-03 LAB — URINALYSIS, ROUTINE W REFLEX MICROSCOPIC
Bilirubin, UA: NEGATIVE
Glucose, UA: NEGATIVE
Ketones, UA: NEGATIVE
Leukocytes,UA: NEGATIVE
Nitrite, UA: NEGATIVE
Protein,UA: NEGATIVE
RBC, UA: NEGATIVE
Specific Gravity, UA: 1.02 (ref 1.005–1.030)
Urobilinogen, Ur: 0.2 mg/dL (ref 0.2–1.0)
pH, UA: 7 (ref 5.0–7.5)

## 2021-04-03 LAB — WET PREP FOR TRICH, YEAST, CLUE
Clue Cell Exam: POSITIVE — AB
Trichomonas Exam: NEGATIVE
Yeast Exam: NEGATIVE

## 2021-04-03 LAB — BAYER DCA HB A1C WAIVED: HB A1C (BAYER DCA - WAIVED): 4.7 % (ref <7.0)

## 2021-04-03 MED ORDER — METRONIDAZOLE 500 MG PO TABS
500.0000 mg | ORAL_TABLET | Freq: Two times a day (BID) | ORAL | 0 refills | Status: DC
Start: 2021-04-03 — End: 2021-08-11

## 2021-04-03 NOTE — Progress Notes (Signed)
Interpreted by me today with NSR at 62bpm with no ST segment changes

## 2021-04-03 NOTE — Progress Notes (Signed)
BP 136/75   Pulse 66   Temp 97.9 F (36.6 C) (Oral)   Wt 167 lb 12.8 oz (76.1 kg)   SpO2 96%   BMI 27.08 kg/m    Subjective:    Patient ID: Tracey Weaver, female    DOB: [birth year], 55 y.o.   MRN: [id number]  HPI: Tracey Weaver is a 55 y.o. female  Chief Complaint  Patient presents with   Urinary Tract Infection    Pt states she has been having occasional low back pain, lower abdominal pain, and urine odor    Has not felt like herself in a couple of months.   URINARY SYMPTOMS Duration: about a month Dysuria: no Urinary frequency: no Urgency: no Small volume voids: no Symptom severity: mild Urinary incontinence: no Foul odor: yes Hematuria: yes Abdominal pain: no Back pain: yes on the L Suprapubic pain/pressure: yes Flank pain: no Fever:  no Vomiting: no Relief with cranberry juice: no Relief with pyridium: no Status: stable Previous urinary tract infection: yes Recurrent urinary tract infection: no Sexual activity: monogomous History of sexually transmitted disease: no Vaginal discharge: no Treatments attempted: increasing fluids   Relevant past medical, surgical, family and social history reviewed and updated as indicated. Interim medical history since our last visit reviewed. Allergies and medications reviewed and updated.  Review of Systems  Constitutional:  Positive for fatigue. Negative for activity change, appetite change, chills, diaphoresis, fever and unexpected weight change.  Respiratory: Negative.    Cardiovascular: Negative.   Musculoskeletal: Negative.   Neurological:  Positive for dizziness and headaches. Negative for tremors, seizures, syncope, facial asymmetry, speech difficulty, weakness, light-headedness and numbness.  Hematological: Negative.   Psychiatric/Behavioral: Negative.     Per HPI unless specifically indicated above     Objective:    BP 136/75   Pulse 66   Temp 97.9 F (36.6 C) (Oral)   Wt 167 lb 12.8 oz (76.1 kg)    SpO2 96%   BMI 27.08 kg/m   Wt Readings from Last 3 Encounters:  04/03/21 167 lb 12.8 oz (76.1 kg)  10/05/20 166 lb 3.2 oz (75.4 kg)  03/22/20 160 lb 9.6 oz (72.8 kg)    Physical Exam Vitals and nursing note reviewed.  Constitutional:      General: She is not in acute distress.    Appearance: Normal appearance. She is not ill-appearing, toxic-appearing or diaphoretic.  HENT:     Head: Normocephalic and atraumatic.     Right Ear: External ear normal.     Left Ear: External ear normal.     Nose: Nose normal.     Mouth/Throat:     Mouth: Mucous membranes are moist.     Pharynx: Oropharynx is clear.  Eyes:     General: No scleral icterus.       Right eye: No discharge.        Left eye: No discharge.     Extraocular Movements: Extraocular movements intact.     Conjunctiva/sclera: Conjunctivae normal.     Pupils: Pupils are equal, round, and reactive to light.  Cardiovascular:     Rate and Rhythm: Normal rate and regular rhythm.     Pulses: Normal pulses.     Heart sounds: Normal heart sounds. No murmur heard.   No friction rub. No gallop.  Pulmonary:     Effort: Pulmonary effort is normal. No respiratory distress.     Breath sounds: Normal breath sounds. No stridor. No wheezing, rhonchi or rales.  Chest:  Chest wall: No tenderness.  Musculoskeletal:        General: Normal range of motion.     Cervical back: Normal range of motion and neck supple.  Skin:    General: Skin is warm and dry.     Capillary Refill: Capillary refill takes less than 2 seconds.     Coloration: Skin is not jaundiced or pale.     Findings: No bruising, erythema, lesion or rash.  Neurological:     General: No focal deficit present.     Mental Status: She is alert and oriented to person, place, and time. Mental status is at baseline.  Psychiatric:        Mood and Affect: Mood normal.        Behavior: Behavior normal.        Thought Content: Thought content normal.        Judgment: Judgment  normal.    Results for orders placed or performed in visit on 04/03/21  Urinalysis, Routine w reflex microscopic  Result Value Ref Range   Specific Gravity, UA 1.020 1.005 - 1.030   pH, UA 7.0 5.0 - 7.5   Color, UA Yellow Yellow   Appearance Ur Clear Clear   Leukocytes,UA Negative Negative   Protein,UA Negative Negative/Trace   Glucose, UA Negative Negative   Ketones, UA Negative Negative   RBC, UA Negative Negative   Bilirubin, UA Negative Negative   Urobilinogen, Ur 0.2 0.2 - 1.0 mg/dL   Nitrite, UA Negative Negative  Bayer DCA Hb A1c Waived  Result Value Ref Range   HB A1C (BAYER DCA - WAIVED) 4.7 <7.0 %      Assessment & Plan:   Problem List Items Addressed This Visit   None Visit Diagnoses     Dizziness    -  Primary   Will check labs. EKG normal. Declines referral to cardiology. Call if not getting better or getting worse. Await results.    Relevant Orders   Bayer DCA Hb A1c Waived (Completed)   CBC with Differential/Platelet   Comprehensive metabolic panel   TSH   Rocky mtn spotted fvr abs pnl(IgG+IgM)   Lyme Disease Serology w/Reflex   Ehrlichia Antibody Panel   Babesia microti Antibody Panel   EKG 12-Lead (Completed)   Dysuria       UA clear, + BV   Relevant Orders   Urinalysis, Routine w reflex microscopic (Completed)   WET PREP FOR TRICH, YEAST, CLUE   Urine Culture   BV (bacterial vaginosis)       Will treat with flagyl. Call with any concerns. Continue to monitor.    Relevant Medications   metroNIDAZOLE (FLAGYL) 500 MG tablet        Follow up plan: Return in about 6 months (around 10/04/2021) for physical.

## 2021-04-05 ENCOUNTER — Encounter: Payer: Self-pay | Admitting: Family Medicine

## 2021-04-06 LAB — URINE CULTURE

## 2021-04-07 ENCOUNTER — Other Ambulatory Visit: Payer: Self-pay | Admitting: Family Medicine

## 2021-04-07 MED ORDER — NITROFURANTOIN MONOHYD MACRO 100 MG PO CAPS: 100.0000 mg | ORAL_CAPSULE | Freq: Two times a day (BID) | ORAL | 0 refills | Status: DC

## 2021-04-12 LAB — CBC WITH DIFFERENTIAL/PLATELET
Basophils Absolute: 0.1 10*3/uL (ref 0.0–0.2)
Basos: 1 %
EOS (ABSOLUTE): 0.1 10*3/uL (ref 0.0–0.4)
Eos: 2 %
Hematocrit: 42.6 % (ref 34.0–46.6)
Hemoglobin: 14.1 g/dL (ref 11.1–15.9)
Immature Grans (Abs): 0 10*3/uL (ref 0.0–0.1)
Immature Granulocytes: 0 %
Lymphocytes Absolute: 2.5 10*3/uL (ref 0.7–3.1)
Lymphs: 50 %
MCH: 31.8 pg (ref 26.6–33.0)
MCHC: 33.1 g/dL (ref 31.5–35.7)
MCV: 96 fL (ref 79–97)
Monocytes Absolute: 0.3 10*3/uL (ref 0.1–0.9)
Monocytes: 5 %
Neutrophils Absolute: 2 10*3/uL (ref 1.4–7.0)
Neutrophils: 42 %
Platelets: 201 10*3/uL (ref 150–450)
RBC: 4.44 x10E6/uL (ref 3.77–5.28)
RDW: 12.2 % (ref 11.7–15.4)
WBC: 4.9 10*3/uL (ref 3.4–10.8)

## 2021-04-12 LAB — COMPREHENSIVE METABOLIC PANEL
ALT: 48 IU/L — ABNORMAL HIGH (ref 0–32)
AST: 29 IU/L (ref 0–40)
Albumin/Globulin Ratio: 2.5 — ABNORMAL HIGH (ref 1.2–2.2)
Albumin: 4.5 g/dL (ref 3.8–4.9)
Alkaline Phosphatase: 64 IU/L (ref 44–121)
BUN/Creatinine Ratio: 31 — ABNORMAL HIGH (ref 9–23)
BUN: 18 mg/dL (ref 6–24)
Bilirubin Total: 0.4 mg/dL (ref 0.0–1.2)
CO2: 25 mmol/L (ref 20–29)
Calcium: 9.6 mg/dL (ref 8.7–10.2)
Chloride: 102 mmol/L (ref 96–106)
Creatinine, Ser: 0.58 mg/dL (ref 0.57–1.00)
Globulin, Total: 1.8 g/dL (ref 1.5–4.5)
Glucose: 85 mg/dL (ref 65–99)
Potassium: 4.5 mmol/L (ref 3.5–5.2)
Sodium: 139 mmol/L (ref 134–144)
Total Protein: 6.3 g/dL (ref 6.0–8.5)
eGFR: 107 mL/min/{1.73_m2} (ref >59)

## 2021-04-12 LAB — EHRLICHIA ANTIBODY PANEL
E. Chaffeensis (HME) IgM Titer: NEGATIVE
E.Chaffeensis (HME) IgG: NEGATIVE
HGE IgG Titer: NEGATIVE
HGE IgM Titer: NEGATIVE

## 2021-04-12 LAB — BABESIA MICROTI ANTIBODY PANEL
Babesia microti IgG: <1:10 {titer}
Babesia microti IgM: <1:10 {titer}

## 2021-04-12 LAB — ROCKY MTN SPOTTED FVR ABS PNL(IGG+IGM)
RMSF IgG: NEGATIVE
RMSF IgM: 0.37 index (ref 0.00–0.89)

## 2021-04-12 LAB — TSH: TSH: 2.02 u[IU]/mL (ref 0.450–4.500)

## 2021-04-12 LAB — LYME DISEASE SEROLOGY W/REFLEX: Lyme Total Antibody EIA: NEGATIVE

## 2021-04-17 DIAGNOSIS — J019 Acute sinusitis, unspecified: Secondary | ICD-10-CM | POA: Diagnosis not present

## 2021-04-17 DIAGNOSIS — R051 Acute cough: Secondary | ICD-10-CM | POA: Diagnosis not present

## 2021-04-24 ENCOUNTER — Other Ambulatory Visit: Payer: Self-pay | Admitting: Family Medicine

## 2021-04-24 DIAGNOSIS — Z1231 Encounter for screening mammogram for malignant neoplasm of breast: Secondary | ICD-10-CM

## 2021-04-26 ENCOUNTER — Other Ambulatory Visit: Payer: Self-pay | Admitting: Family Medicine

## 2021-04-26 NOTE — Telephone Encounter (Signed)
Requested medication (s) are due for refill today: see request for 90 day supply  Requested medication (s) are on the active medication list: yes  Last refill:  01/29/21 #135 2 refills  Future visit scheduled: yes in 5 months  Notes to clinic:  requesting 90 day supply . Mammogram not up to date . Do you want to refill for 90 days?     Requested Prescriptions  Pending Prescriptions Disp Refills   estradiol (ESTRACE) 0.5 MG tablet [Pharmacy Med Name: ESTRADIOL 0.5 MG TABLET] 135 tablet 3    Sig: TAKE 1 & 1/2 TABLETS (0.75 MG TOTAL) BY MOUTH DAILY.     OB/GYN:  Estrogens Failed - 04/26/2021  9:35 AM      Failed - Mammogram is up-to-date per Health Maintenance      Passed - Last BP in normal range    BP Readings from Last 1 Encounters:  04/03/21 136/75          Passed - Valid encounter within last 12 months    Recent Outpatient Visits           3 weeks ago Dizziness   [place], Megan P, DO   6 months ago Routine general medical examination at a health care facility   [facility], [place], DO   1 year ago Essential hypertension   [place], [place], DO   1 year ago Routine general medical examination at a health care facility   [facility], [place], DO   2 years ago Tinea corporis   [place], Jolene T, NP       Future Appointments             In 5 months Wynetta Emery, Barb Merino, DO [name]

## 2021-04-27 DIAGNOSIS — S0011XA Contusion of right eyelid and periocular area, initial encounter: Secondary | ICD-10-CM | POA: Diagnosis not present

## 2021-04-27 DIAGNOSIS — S0511XA Contusion of eyeball and orbital tissues, right eye, initial encounter: Secondary | ICD-10-CM | POA: Diagnosis not present

## 2021-04-27 DIAGNOSIS — I10 Essential (primary) hypertension: Secondary | ICD-10-CM | POA: Diagnosis not present

## 2021-04-27 DIAGNOSIS — Z6826 Body mass index (BMI) 26.0-26.9, adult: Secondary | ICD-10-CM | POA: Diagnosis not present

## 2021-04-27 DIAGNOSIS — W540XXA Bitten by dog, initial encounter: Secondary | ICD-10-CM | POA: Diagnosis not present

## 2021-04-27 DIAGNOSIS — R52 Pain, unspecified: Secondary | ICD-10-CM | POA: Diagnosis not present

## 2021-04-27 DIAGNOSIS — Z79899 Other long term (current) drug therapy: Secondary | ICD-10-CM | POA: Diagnosis not present

## 2021-04-27 DIAGNOSIS — R0902 Hypoxemia: Secondary | ICD-10-CM | POA: Diagnosis not present

## 2021-04-27 DIAGNOSIS — S01111A Laceration without foreign body of right eyelid and periocular area, initial encounter: Secondary | ICD-10-CM | POA: Diagnosis not present

## 2021-04-27 DIAGNOSIS — S0993XA Unspecified injury of face, initial encounter: Secondary | ICD-10-CM | POA: Diagnosis not present

## 2021-04-27 DIAGNOSIS — H538 Other visual disturbances: Secondary | ICD-10-CM | POA: Diagnosis not present

## 2021-04-27 DIAGNOSIS — S01151A Open bite of right eyelid and periocular area, initial encounter: Secondary | ICD-10-CM | POA: Diagnosis not present

## 2021-04-27 DIAGNOSIS — W5581XA Bitten by other mammals, initial encounter: Secondary | ICD-10-CM | POA: Diagnosis not present

## 2021-04-28 DIAGNOSIS — Z886 Allergy status to analgesic agent status: Secondary | ICD-10-CM | POA: Diagnosis not present

## 2021-04-28 DIAGNOSIS — N189 Chronic kidney disease, unspecified: Secondary | ICD-10-CM | POA: Diagnosis not present

## 2021-04-28 DIAGNOSIS — Z79899 Other long term (current) drug therapy: Secondary | ICD-10-CM | POA: Diagnosis not present

## 2021-04-28 DIAGNOSIS — X58XXXA Exposure to other specified factors, initial encounter: Secondary | ICD-10-CM | POA: Diagnosis not present

## 2021-04-28 DIAGNOSIS — H539 Unspecified visual disturbance: Secondary | ICD-10-CM | POA: Diagnosis not present

## 2021-04-28 DIAGNOSIS — M1990 Unspecified osteoarthritis, unspecified site: Secondary | ICD-10-CM | POA: Diagnosis not present

## 2021-04-28 DIAGNOSIS — S01111A Laceration without foreign body of right eyelid and periocular area, initial encounter: Secondary | ICD-10-CM | POA: Diagnosis not present

## 2021-04-28 DIAGNOSIS — Z7289 Other problems related to lifestyle: Secondary | ICD-10-CM | POA: Diagnosis not present

## 2021-04-28 DIAGNOSIS — Z90710 Acquired absence of both cervix and uterus: Secondary | ICD-10-CM | POA: Diagnosis not present

## 2021-05-08 ENCOUNTER — Ambulatory Visit: Payer: BC Managed Care – PPO | Admitting: Nurse Practitioner

## 2021-05-08 ENCOUNTER — Telehealth: Payer: BC Managed Care – PPO | Admitting: Family Medicine

## 2021-05-08 DIAGNOSIS — B379 Candidiasis, unspecified: Secondary | ICD-10-CM | POA: Diagnosis not present

## 2021-05-08 DIAGNOSIS — T3695XA Adverse effect of unspecified systemic antibiotic, initial encounter: Secondary | ICD-10-CM | POA: Diagnosis not present

## 2021-05-08 MED ORDER — FLUCONAZOLE 150 MG PO TABS
150.0000 mg | ORAL_TABLET | Freq: Once | ORAL | 0 refills | Status: AC
Start: 2021-05-08 — End: 2021-05-08

## 2021-05-08 NOTE — Progress Notes (Signed)

## 2021-05-24 DIAGNOSIS — H05221 Edema of right orbit: Secondary | ICD-10-CM | POA: Diagnosis not present

## 2021-05-24 DIAGNOSIS — W540XXD Bitten by dog, subsequent encounter: Secondary | ICD-10-CM | POA: Diagnosis not present

## 2021-05-24 DIAGNOSIS — Z9889 Other specified postprocedural states: Secondary | ICD-10-CM | POA: Diagnosis not present

## 2021-05-27 ENCOUNTER — Other Ambulatory Visit: Payer: Self-pay | Admitting: Family Medicine

## 2021-05-27 NOTE — Telephone Encounter (Signed)
Disregard request per [facility] pharmacist

## 2021-05-29 DIAGNOSIS — Z09 Encounter for follow-up examination after completed treatment for conditions other than malignant neoplasm: Secondary | ICD-10-CM | POA: Diagnosis not present

## 2021-07-17 DIAGNOSIS — L905 Scar conditions and fibrosis of skin: Secondary | ICD-10-CM | POA: Diagnosis not present

## 2021-07-17 DIAGNOSIS — W540XXA Bitten by dog, initial encounter: Secondary | ICD-10-CM | POA: Diagnosis not present

## 2021-07-23 ENCOUNTER — Other Ambulatory Visit: Payer: Self-pay | Admitting: Family Medicine

## 2021-07-24 NOTE — Telephone Encounter (Signed)
Requested medications are due for refill today NO  Requested medications are on the active medication list Yes  Last refill 06/30/21 for 90 day supply  Last visit 04/03/21  Future visit scheduled 10/09/21  Notes to clinic requesting refill too soon, please assess. Requested Prescriptions  Pending Prescriptions Disp Refills   estradiol (ESTRACE) 0.5 MG tablet [Pharmacy Med Name: ESTRADIOL 0.5 MG TABLET] 135 tablet 3    Sig: TAKE 1 & 1/2 TABLETS (0.75 MG TOTAL) BY MOUTH DAILY.     OB/GYN:  Estrogens Failed - 07/23/2021  2:31 PM      Failed - Mammogram is up-to-date per Health Maintenance      Passed - Last BP in normal range    BP Readings from Last 1 Encounters:  04/03/21 136/75          Passed - Valid encounter within last 12 months    Recent Outpatient Visits           3 months ago Dizziness   [place], Megan P, DO   9 months ago Routine general medical examination at a health care facility   [facility], [place], DO   1 year ago Essential hypertension   [place], [place], DO   1 year ago Routine general medical examination at a health care facility   [facility], [place], DO   2 years ago Tinea corporis   [place], Jolene T, NP       Future Appointments             In 2 months Wynetta Emery, Barb Merino, DO [name], [facility]

## 2021-08-07 ENCOUNTER — Ambulatory Visit
Admission: RE | Admit: 2021-08-07 | Discharge: 2021-08-07 | Disposition: A | Discharge status: Home / Self Care | Payer: BC Managed Care – PPO | Source: Ambulatory Visit | Attending: Family Medicine | Admitting: Family Medicine

## 2021-08-07 ENCOUNTER — Other Ambulatory Visit: Payer: Self-pay

## 2021-08-07 DIAGNOSIS — Z1231 Encounter for screening mammogram for malignant neoplasm of breast: Secondary | ICD-10-CM

## 2021-08-10 ENCOUNTER — Ambulatory Visit: Payer: BC Managed Care – PPO | Admitting: Nurse Practitioner

## 2021-08-11 ENCOUNTER — Other Ambulatory Visit: Payer: Self-pay

## 2021-08-11 ENCOUNTER — Encounter: Payer: Self-pay | Admitting: Family Medicine

## 2021-08-11 ENCOUNTER — Ambulatory Visit (INDEPENDENT_AMBULATORY_CARE_PROVIDER_SITE_OTHER): Payer: BC Managed Care – PPO | Admitting: Family Medicine

## 2021-08-11 VITALS — BP 120/83 | HR 76 | Temp 98.4°F | Wt 169.0 lb

## 2021-08-11 DIAGNOSIS — R52 Pain, unspecified: Secondary | ICD-10-CM | POA: Diagnosis not present

## 2021-08-11 DIAGNOSIS — J111 Influenza due to unidentified influenza virus with other respiratory manifestations: Secondary | ICD-10-CM

## 2021-08-11 MED ORDER — HYDROCOD POLST-CPM POLST ER 10-8 MG/5ML PO SUER: 5.0000 mL | Freq: Two times a day (BID) | ORAL | 0 refills | Status: DC | PRN

## 2021-08-11 MED ORDER — PREDNISONE 50 MG PO TABS: 50.0000 mg | ORAL_TABLET | Freq: Every day | ORAL | 0 refills | Status: DC

## 2021-08-11 MED ORDER — BENZONATATE 200 MG PO CAPS: 200.0000 mg | ORAL_CAPSULE | Freq: Two times a day (BID) | ORAL | 0 refills | Status: DC | PRN

## 2021-08-11 NOTE — Progress Notes (Signed)
BP 120/83    Pulse 76    Temp 98.4 F (36.9 C)    Wt 169 lb (76.7 kg)    SpO2 98%    BMI 27.28 kg/m    Subjective:    Patient ID: Tracey Weaver, female    DOB: April 03, 1966, 55 y.o.   MRN: [id number]  HPI: Tracey Weaver is a 55 y.o. female  Chief Complaint  Patient presents with   URI    Patient states she started feeling sick on Tuesday. Has cough, congestion, ear pain, and stopped up head. Patient tested for COVID on Tuesday and it was negative.    UPPER RESPIRATORY TRACT INFECTION Duration: 3-4 days Worst symptom: congestion, cough Fever: no Cough: yes Shortness of breath: no Wheezing: no Chest pain: no Chest tightness: no Chest congestion: no Nasal congestion: yes Runny nose: yes Post nasal drip: yes Sneezing: no Sore throat: yes Swollen glands: yes Sinus pressure: no Headache: yes Face pain: no Toothache: no Ear pain: yes, bilateral  Ear pressure: no  Eyes red/itching:no Eye drainage/crusting: no  Vomiting: no Rash: no Fatigue: yes Sick contacts: yes Strep contacts: no  Context: worse Recurrent sinusitis: no Relief with OTC cold/cough medications: no  Treatments attempted: cold/sinus and mucinex   Relevant past medical, surgical, family and social history reviewed and updated as indicated. Interim medical history since our last visit reviewed. Allergies and medications reviewed and updated.  Review of Systems  Constitutional:  Positive for chills, diaphoresis and fatigue. Negative for activity change, appetite change, fever and unexpected weight change.  HENT:  Positive for congestion, postnasal drip, rhinorrhea, sinus pressure and sore throat. Negative for dental problem, drooling, ear discharge, ear pain, facial swelling, hearing loss, mouth sores, nosebleeds, sinus pain, sneezing, tinnitus, trouble swallowing and voice change.   Eyes: Negative.   Respiratory:  Positive for cough, shortness of breath and wheezing. Negative for apnea, choking, chest  tightness and stridor.   Cardiovascular: Negative.   Gastrointestinal: Negative.   Skin: Negative.   Neurological: Negative.   Psychiatric/Behavioral: Negative.     Per HPI unless specifically indicated above     Objective:    BP 120/83    Pulse 76    Temp 98.4 F (36.9 C)    Wt 169 lb (76.7 kg)    SpO2 98%    BMI 27.28 kg/m   Wt Readings from Last 3 Encounters:  08/11/21 169 lb (76.7 kg)  04/03/21 167 lb 12.8 oz (76.1 kg)  10/05/20 166 lb 3.2 oz (75.4 kg)    Physical Exam Vitals and nursing note reviewed.  Constitutional:      General: She is not in acute distress.    Appearance: Normal appearance. She is normal weight. She is ill-appearing. She is not toxic-appearing or diaphoretic.  HENT:     Head: Normocephalic and atraumatic.     Right Ear: Tympanic membrane, ear canal and external ear normal. There is no impacted cerumen.     Left Ear: Tympanic membrane, ear canal and external ear normal. There is no impacted cerumen.     Nose: Congestion and rhinorrhea present.     Mouth/Throat:     Mouth: Mucous membranes are moist.     Pharynx: Oropharynx is clear. No oropharyngeal exudate or posterior oropharyngeal erythema.  Eyes:     General: No scleral icterus.       Right eye: No discharge.        Left eye: No discharge.     Extraocular Movements:  Extraocular movements intact.     Conjunctiva/sclera: Conjunctivae normal.     Pupils: Pupils are equal, round, and reactive to light.  Cardiovascular:     Rate and Rhythm: Normal rate and regular rhythm.     Pulses: Normal pulses.     Heart sounds: Normal heart sounds. No murmur heard.   No friction rub. No gallop.  Pulmonary:     Effort: Pulmonary effort is normal. No respiratory distress.     Breath sounds: Normal breath sounds. No stridor. No wheezing, rhonchi or rales.  Chest:     Chest wall: No tenderness.  Musculoskeletal:        General: Normal range of motion.     Cervical back: Normal range of motion and neck  supple.  Skin:    General: Skin is warm and dry.     Capillary Refill: Capillary refill takes less than 2 seconds.     Coloration: Skin is not jaundiced or pale.     Findings: No bruising, erythema, lesion or rash.  Neurological:     General: No focal deficit present.     Mental Status: She is alert and oriented to person, place, and time. Mental status is at baseline.  Psychiatric:        Mood and Affect: Mood normal.        Behavior: Behavior normal.        Thought Content: Thought content normal.        Judgment: Judgment normal.    Results for orders placed or performed in visit on 04/03/21  WET PREP FOR [place], YEAST, CLUE   Specimen: Sterile Swab   Sterile Swab  Result Value Ref Range   Trichomonas Exam Negative Negative   Yeast Exam Negative Negative   Clue Cell Exam Positive (A) Negative  Urine Culture   Specimen: Urine   UR  Result Value Ref Range   Urine Culture, Routine Final report (A)    Organism ID, Bacteria Escherichia coli (A)    Antimicrobial Susceptibility Comment   Urinalysis, Routine w reflex microscopic  Result Value Ref Range   Specific Gravity, UA 1.020 1.005 - 1.030   pH, UA 7.0 5.0 - 7.5   Color, UA Yellow Yellow   Appearance Ur Clear Clear   Leukocytes,UA Negative Negative   Protein,UA Negative Negative/Trace   Glucose, UA Negative Negative   Ketones, UA Negative Negative   RBC, UA Negative Negative   Bilirubin, UA Negative Negative   Urobilinogen, Ur 0.2 0.2 - 1.0 mg/dL   Nitrite, UA Negative Negative  Bayer DCA Hb A1c Waived  Result Value Ref Range   HB A1C (BAYER DCA - WAIVED) 4.7 <7.0 %  CBC with Differential/Platelet  Result Value Ref Range   WBC 4.9 3.4 - 10.8 x10E3/uL   RBC 4.44 3.77 - 5.28 x10E6/uL   Hemoglobin 14.1 11.1 - 15.9 g/dL   Hematocrit 42.6 34.0 - 46.6 %   MCV 96 79 - 97 fL   MCH 31.8 26.6 - 33.0 pg   MCHC 33.1 31.5 - 35.7 g/dL   RDW 12.2 11.7 - 15.4 %   Platelets 201 150 - 450 x10E3/uL   Neutrophils 42 Not Estab. %    Lymphs 50 Not Estab. %   Monocytes 5 Not Estab. %   Eos 2 Not Estab. %   Basos 1 Not Estab. %   Neutrophils Absolute 2.0 1.4 - 7.0 x10E3/uL   Lymphocytes Absolute 2.5 0.7 - 3.1 x10E3/uL   Monocytes Absolute 0.3 0.1 -  0.9 x10E3/uL   EOS (ABSOLUTE) 0.1 0.0 - 0.4 x10E3/uL   Basophils Absolute 0.1 0.0 - 0.2 x10E3/uL   Immature Granulocytes 0 Not Estab. %   Immature Grans (Abs) 0.0 0.0 - 0.1 x10E3/uL  Comprehensive metabolic panel  Result Value Ref Range   Glucose 85 65 - 99 mg/dL   BUN 18 6 - 24 mg/dL   Creatinine, Ser 0.58 0.57 - 1.00 mg/dL   eGFR 107 >59 mL/min/1.73   BUN/Creatinine Ratio 31 (H) 9 - 23   Sodium 139 134 - 144 mmol/L   Potassium 4.5 3.5 - 5.2 mmol/L   Chloride 102 96 - 106 mmol/L   CO2 25 20 - 29 mmol/L   Calcium 9.6 8.7 - 10.2 mg/dL   Total Protein 6.3 6.0 - 8.5 g/dL   Albumin 4.5 3.8 - 4.9 g/dL   Globulin, Total 1.8 1.5 - 4.5 g/dL   Albumin/Globulin Ratio 2.5 (H) 1.2 - 2.2   Bilirubin Total 0.4 0.0 - 1.2 mg/dL   Alkaline Phosphatase 64 44 - 121 IU/L   AST 29 0 - 40 IU/L   ALT 48 (H) 0 - 32 IU/L  TSH  Result Value Ref Range   TSH 2.020 0.450 - 4.500 uIU/mL  Rocky mtn spotted fvr abs pnl(IgG+IgM)  Result Value Ref Range   RMSF IgG Negative Negative   RMSF IgM 0.37 0.00 - 0.89 index  Lyme Disease Serology w/Reflex  Result Value Ref Range   Lyme Total Antibody EIA Negative Negative  Ehrlichia Antibody Panel  Result Value Ref Range   E.Chaffeensis (HME) IgG Negative Neg:<1:64   E. Chaffeensis (HME) IgM Titer Negative Neg:<1:20   HGE IgG Titer Negative Neg:<1:64   HGE IgM Titer Negative Neg:<1:20  Babesia microti Antibody Panel  Result Value Ref Range   Babesia microti IgM <1:10 Neg:<1:10   Babesia microti IgG <1:10 Neg:<1:10      Assessment & Plan:   Problem List Items Addressed This Visit   None Visit Diagnoses     Influenza-like illness    -  Primary   Awaiting swab. Outside the window for tamiflu. Will treat with prednisone, tussionex and  tessalon. Call with any concerns. Continue to monitor.    Body aches       Relevant Orders   Influenza a and b   Novel Coronavirus, NAA ([facility])        Follow up plan: Return if symptoms worsen or fail to improve.

## 2021-08-12 LAB — NOVEL CORONAVIRUS, NAA: SARS-CoV-2, NAA: DETECTED — AB

## 2021-08-12 LAB — SARS-COV-2, NAA 2 DAY TAT

## 2021-08-13 LAB — INFLUENZA A AND B
Influenza A Ag, EIA: NEGATIVE
Influenza B Ag, EIA: NEGATIVE

## 2021-08-13 LAB — PLEASE NOTE:

## 2021-08-22 ENCOUNTER — Other Ambulatory Visit: Payer: Self-pay | Admitting: Family Medicine

## 2021-08-23 NOTE — Telephone Encounter (Signed)
Requested Prescriptions  Pending Prescriptions Disp Refills   estradiol (ESTRACE) 0.5 MG tablet [Pharmacy Med Name: ESTRADIOL 0.5 MG TABLET] 135 tablet 3    Sig: TAKE 1 & 1/2 TABLETS (0.75 MG TOTAL) BY MOUTH DAILY.     OB/GYN:  Estrogens Passed - 08/22/2021  1:34 PM      Passed - Mammogram is up-to-date per Health Maintenance      Passed - Last BP in normal range    BP Readings from Last 1 Encounters:  08/11/21 120/83         Passed - Valid encounter within last 12 months    Recent Outpatient Visits          1 week ago Influenza-like illness   [employer], [place], DO   4 months ago Dizziness   [place], Megan P, DO   10 months ago Routine general medical examination at a health care facility   [place], [place], DO   1 year ago Essential hypertension   [place], [place], DO   1 year ago Routine general medical examination at a health care facility   [place], Barb Merino, DO      Future Appointments            In 1 month Wynetta Emery, Barb Merino, DO [facility], [place]

## 2021-08-23 NOTE — Telephone Encounter (Signed)
Rx written 01/29/2021 #135 with 3 refills.  Pt should have enough meds to last until 01/2022.

## 2021-09-21 ENCOUNTER — Other Ambulatory Visit: Payer: Self-pay | Admitting: Family Medicine

## 2021-09-21 NOTE — Telephone Encounter (Signed)
Requested Prescriptions  Pending Prescriptions Disp Refills   estradiol (ESTRACE) 0.5 MG tablet [Pharmacy Med Name: ESTRADIOL 0.5 MG TABLET] 135 tablet 3    Sig: TAKE 1 & 1/2 TABLETS (0.75 MG TOTAL) BY MOUTH DAILY.     OB/GYN:  Estrogens Passed - 09/21/2021  1:35 PM      Passed - Mammogram is up-to-date per Health Maintenance      Passed - Last BP in normal range    BP Readings from Last 1 Encounters:  08/11/21 120/83         Passed - Valid encounter within last 12 months    Recent Outpatient Visits          1 month ago Influenza-like illness   [place], [place], DO   5 months ago Dizziness   [place], Megan P, DO   11 months ago Routine general medical examination at a health care facility   [place], [place], DO   1 year ago Essential hypertension   [place], [place], DO   2 years ago Routine general medical examination at a health care facility   [place], Barb Merino, DO      Future Appointments            In 2 weeks Wynetta Emery, Barb Merino, DO [facility], [place]

## 2021-10-09 ENCOUNTER — Other Ambulatory Visit: Payer: Self-pay

## 2021-10-09 ENCOUNTER — Ambulatory Visit (INDEPENDENT_AMBULATORY_CARE_PROVIDER_SITE_OTHER): Payer: BC Managed Care – PPO | Admitting: Family Medicine

## 2021-10-09 ENCOUNTER — Encounter: Payer: Self-pay | Admitting: Family Medicine

## 2021-10-09 VITALS — BP 122/82 | HR 64 | Temp 98.2°F | Ht 65.0 in | Wt 167.2 lb

## 2021-10-09 DIAGNOSIS — Z23 Encounter for immunization: Secondary | ICD-10-CM

## 2021-10-09 DIAGNOSIS — Z0000 Encounter for general adult medical examination without abnormal findings: Secondary | ICD-10-CM | POA: Diagnosis not present

## 2021-10-09 DIAGNOSIS — I10 Essential (primary) hypertension: Secondary | ICD-10-CM | POA: Diagnosis not present

## 2021-10-09 DIAGNOSIS — M2550 Pain in unspecified joint: Secondary | ICD-10-CM | POA: Diagnosis not present

## 2021-10-09 DIAGNOSIS — Z833 Family history of diabetes mellitus: Secondary | ICD-10-CM

## 2021-10-09 DIAGNOSIS — R232 Flushing: Secondary | ICD-10-CM

## 2021-10-09 DIAGNOSIS — E782 Mixed hyperlipidemia: Secondary | ICD-10-CM

## 2021-10-09 LAB — URINALYSIS, ROUTINE W REFLEX MICROSCOPIC
Bilirubin, UA: NEGATIVE
Glucose, UA: NEGATIVE
Ketones, UA: NEGATIVE
Leukocytes,UA: NEGATIVE
Nitrite, UA: NEGATIVE
Protein,UA: NEGATIVE
RBC, UA: NEGATIVE
Specific Gravity, UA: 1.025 (ref 1.005–1.030)
Urobilinogen, Ur: 0.2 mg/dL (ref 0.2–1.0)
pH, UA: 6 (ref 5.0–7.5)

## 2021-10-09 LAB — MICROALBUMIN, URINE WAIVED
Creatinine, Urine Waived: 100 mg/dL (ref 10–300)
Microalb, Ur Waived: 10 mg/L (ref 0–19)
Microalb/Creat Ratio: <30 mg/g (ref <30)

## 2021-10-09 LAB — BAYER DCA HB A1C WAIVED: HB A1C (BAYER DCA - WAIVED): 5.1 % (ref 4.8–5.6)

## 2021-10-09 MED ORDER — DICLOFENAC SODIUM 75 MG PO TBEC: 75.0000 mg | DELAYED_RELEASE_TABLET | Freq: Two times a day (BID) | ORAL | 3 refills | Status: DC | PRN

## 2021-10-09 MED ORDER — GABAPENTIN 100 MG PO CAPS: 100.0000 mg | ORAL_CAPSULE | Freq: Every day | ORAL | 3 refills | Status: DC

## 2021-10-09 MED ORDER — ESTRADIOL 0.5 MG PO TABS: ORAL_TABLET | ORAL | 3 refills | Status: DC

## 2021-10-09 NOTE — Progress Notes (Signed)
BP 122/82    Pulse 64    Temp 98.2 F (36.8 C)    Ht 5\' 5"  (1.651 m)    Wt 167 lb 3.2 oz (75.8 kg)    SpO2 98%    BMI 27.82 kg/m    Subjective:    Patient ID: Tracey Weaver, female    DOB: 1966-01-28, 56 y.o.   MRN: [id number]  HPI: Tracey Weaver is a 56 y.o. female presenting on 10/09/2021 for comprehensive medical examination. Current medical complaints include:  HYPERTENSION / HYPERLIPIDEMIA Satisfied with current treatment? yes Duration of hypertension: chronic BP monitoring frequency: not checking BP medication side effects: no Past BP meds: not on anything Duration of hyperlipidemia: chronic Cholesterol medication side effects: no Cholesterol supplements: none Past cholesterol medications: none Medication compliance: not on anything Aspirin: no Recent stressors: no Recurrent headaches: no Visual changes: no Palpitations: no Dyspnea: no Chest pain: no Lower extremity edema: no Dizzy/lightheaded: no  She currently lives with: husband Menopausal Symptoms: yes  Depression Screen done today and results listed below:  Depression screen [facility] 2/9 10/09/2021 10/05/2020 09/21/2019 11/21/2018 03/13/2018  Decreased Interest 1 0 1 0 0  Down, Depressed, Hopeless 0 0 1 0 0  PHQ - 2 Score 1 0 2 0 0  Altered sleeping 1 - 0 - -  Tired, decreased energy 2 - 1 - -  Change in appetite - - 0 - -  Feeling bad or failure about yourself  0 - 1 - -  Trouble concentrating 0 - 2 - -  Moving slowly or fidgety/restless 0 - 1 - -  Suicidal thoughts 0 - 0 - -  PHQ-9 Score 4 - 7 - -  Difficult doing work/chores - - Not difficult at all - -    Past Medical History:  Past Medical History:  Diagnosis Date   Adjustment disorder with depressed mood    Anxiety    Cervical cancer (HCC)    precancerous cells   Chest pain    Constipation    Degenerative disc disease, cervical    Dysplastic nevus 08/27/2013   s/p resection by Dr. Aubery Lapping.   Dysthymic disorder    Headache    from pinched  nerve in neck   History of ectopic pregnancy    Multiple   Hypertension 05/19/2018   IBS (irritable bowel syndrome)    Constipation; Rx for Amitiza worked well.   Interstitial cystitis    [facility]; s/p bladder biopsy; s/p cystoscopy x 3 in 2015.   Kidney stones    Liver function study, abnormal    Neck stiffness    limited left turn   Pneumonia    PONV (postoperative nausea and vomiting)    Pyelonephritis    Recurrent UTI    Splenomegaly    s/p GI consult and hematology consult   Ulcer 09/28/2011   EGD: +gastric ulcer; rx for Dexilant.  [name].   Vertigo     Surgical History:  Past Surgical History:  Procedure Laterality Date   ABDOMINAL EXPLORATION SURGERY  08/28/1995   ruptured tube from eptopic pregnancy   CESAREAN SECTION     x 2.   COLONOSCOPY  08/27/2010   [name].     COLONOSCOPY WITH PROPOFOL N/A 11/23/2019   Procedure: COLONOSCOPY WITH PROPOFOL;  Surgeon: Lucilla Lame, MD;  Location: [facility];  Service: Endoscopy;  Laterality: N/A;   CYSTOSTOMY W/ BLADDER BIOPSY  05/27/2014   [name]. Benign.   ESOPHAGOGASTRODUODENOSCOPY  09/28/2011   +gastric ulcer.  [name].   TONSILLECTOMY  05/27/2014   [facility].   TOTAL ABDOMINAL HYSTERECTOMY  08/27/1998   cervical dysplasia;one ovary remaining    Medications:  Current Outpatient Medications on File Prior to Visit  Medication Sig   Ascorbic Acid (VITAMIN C PO) Take by mouth daily.   CASCARA SAGRADA PO Take by mouth.   Cyanocobalamin 2500 MCG SUBL Place under the tongue daily.    Glucosamine 500 MG CAPS Take 1,000 mg by mouth daily.   meclizine (ANTIVERT) 25 MG tablet Take 1 tablet (25 mg total) by mouth 3 (three) times daily as needed for dizziness.   Multiple Vitamin (MULTIVITAMIN) capsule Take by mouth daily.    RETIN-A MICRO PUMP 0.06 % GEL APPLY 1 APPLICATION ON THE SKIN AT NIGHT/BEDTIME   scopolamine (TRANSDERM-SCOP, 1.5 MG,) 1 MG/3DAYS Place 1 patch (1.5 mg total) onto the skin every 3 (three) days.    Turmeric (QC TUMERIC COMPLEX PO) Take 1,000 mg by mouth daily.   VITAMIN D PO Take by mouth daily. Gummies   No current facility-administered medications on file prior to visit.    Allergies:  Allergies  Allergen Reactions   Meloxicam Other (See Comments)    "MAKES ME FEEL FUNNY", Dizziness    Social History:  Social History   Socioeconomic History   Marital status: Married    Spouse name: Not on file   Number of children: 2   Years of education: 14   Highest education level: Not on file  Occupational History   Occupation: Oncologist: self employed  Tobacco Use   Smoking status: Never   Smokeless tobacco: Never  Vaping Use   Vaping Use: Never used  Substance and Sexual Activity   Alcohol use: Yes    Alcohol/week: 0.0 standard drinks    Comment: 1-2 drinks per month   Drug use: No   Sexual activity: Yes    Partners: Male    Birth control/protection: Surgical  Other Topics Concern   Not on file  Social History Narrative   Marital status: married x 18 years; happily married; no abuse      Children: two (23, 60); 1 grandson Manufacturing systems engineer)      Lives: husband, son      Employment: hairdresser; 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      Tobacco:  None      Alcohol: 1-2 servings per month.      Drugs:  none      Exercise:  none      Caffeine use: Coffee, 1 serving per day   Always uses seat belts, smoke alarm and carbon monoxide detector in the home, Guns locked up.   Organ Donor: YES   Patient DOES not have living will, DOES not have HCPOA.         Social Determinants of Health   Financial Resource Strain: Not on file  Food Insecurity: Not on file  Transportation Needs: Not on file  Physical Activity: Not on file  Stress: Not on file  Social Connections: Not on file  Intimate Partner Violence: Not on file   Social History   Tobacco Use  Smoking Status Never  Smokeless Tobacco Never   Social History   Substance and Sexual Activity  Alcohol Use Yes    Alcohol/week: 0.0 standard drinks   Comment: 1-2 drinks per month    Family History:  Family History  Problem Relation Age of Onset   Hypertension Mother    COPD Mother  Hyperlipidemia Mother    Alcohol abuse Mother    Colon polyps Mother    Heart attack Mother    Heart disease Mother 40       AMI x 2; stents.  PAD s/p stenting.   Cancer Father        prostate cancer with mets   Diabetes Father    Heart disease Father 75       AMI age 39   Hyperlipidemia Father    Hypertension Father    Mental illness Brother        paranoid schizophrenia   Hypothyroidism Brother    Hyperlipidemia Brother    Cancer Brother 74       lung, germ cell; lobectomy at 25   Drug abuse Brother    Hypertension Brother    Epilepsy Son    Diabetes Son    Stroke Maternal Grandmother    Heart disease Maternal Grandmother    Hypertension Maternal Grandmother    Stroke Maternal Grandfather    Heart disease Maternal Grandfather    Hypertension Maternal Grandfather    Cancer Paternal Grandmother    Heart disease Paternal Grandmother    Diabetes Paternal Grandmother    Breast cancer Paternal Grandmother    Stroke Paternal Grandfather    Heart disease Paternal Grandfather     Past medical history, surgical history, medications, allergies, family history and social history reviewed with patient today and changes made to appropriate areas of the chart.   Review of Systems  Constitutional: Negative.   HENT: Negative.    Eyes: Negative.   Respiratory: Negative.    Cardiovascular: Negative.   Gastrointestinal: Negative.   Genitourinary: Negative.   Musculoskeletal:  Positive for joint pain (hip pain). Negative for back pain, falls, myalgias and neck pain.  Skin: Negative.   Neurological:  Positive for dizziness (first thing in the AM). Negative for tingling, tremors, sensory change, speech change, focal weakness, seizures, loss of consciousness, weakness and headaches.  Endo/Heme/Allergies:   Positive for polydipsia (at night time). Negative for environmental allergies. Does not bruise/bleed easily.  Psychiatric/Behavioral: Negative.    All other ROS negative except what is listed above and in the HPI.      Objective:    BP 122/82    Pulse 64    Temp 98.2 F (36.8 C)    Ht 5\' 5"  (1.651 m)    Wt 167 lb 3.2 oz (75.8 kg)    SpO2 98%    BMI 27.82 kg/m   Wt Readings from Last 3 Encounters:  10/09/21 167 lb 3.2 oz (75.8 kg)  08/11/21 169 lb (76.7 kg)  04/03/21 167 lb 12.8 oz (76.1 kg)    Physical Exam Vitals and nursing note reviewed.  Constitutional:      General: She is not in acute distress.    Appearance: Normal appearance. She is not ill-appearing, toxic-appearing or diaphoretic.  HENT:     Head: Normocephalic and atraumatic.     Right Ear: Tympanic membrane, ear canal and external ear normal. There is no impacted cerumen.     Left Ear: Tympanic membrane, ear canal and external ear normal. There is no impacted cerumen.     Nose: Nose normal. No congestion or rhinorrhea.     Mouth/Throat:     Mouth: Mucous membranes are moist.     Pharynx: Oropharynx is clear. No oropharyngeal exudate or posterior oropharyngeal erythema.  Eyes:     General: No scleral icterus.       Right  eye: No discharge.        Left eye: No discharge.     Extraocular Movements: Extraocular movements intact.     Conjunctiva/sclera: Conjunctivae normal.     Pupils: Pupils are equal, round, and reactive to light.  Neck:     Vascular: No carotid bruit.  Cardiovascular:     Rate and Rhythm: Normal rate and regular rhythm.     Pulses: Normal pulses.     Heart sounds: No murmur heard.   No friction rub. No gallop.  Pulmonary:     Effort: Pulmonary effort is normal. No respiratory distress.     Breath sounds: Normal breath sounds. No stridor. No wheezing, rhonchi or rales.  Chest:     Chest wall: No tenderness.  Abdominal:     General: Abdomen is flat. Bowel sounds are normal. There is no  distension.     Palpations: Abdomen is soft. There is no mass.     Tenderness: There is no abdominal tenderness. There is no right CVA tenderness, left CVA tenderness, guarding or rebound.     Hernia: No hernia is present.  Genitourinary:    Comments: Breast and pelvic exams deferred with shared decision making Musculoskeletal:        General: No swelling, tenderness, deformity or signs of injury.     Cervical back: Normal range of motion and neck supple. No rigidity. No muscular tenderness.     Right lower leg: No edema.     Left lower leg: No edema.  Lymphadenopathy:     Cervical: No cervical adenopathy.  Skin:    General: Skin is warm and dry.     Capillary Refill: Capillary refill takes less than 2 seconds.     Coloration: Skin is not jaundiced or pale.     Findings: No bruising, erythema, lesion or rash.  Neurological:     General: No focal deficit present.     Mental Status: She is alert and oriented to person, place, and time. Mental status is at baseline.     Cranial Nerves: No cranial nerve deficit.     Sensory: No sensory deficit.     Motor: No weakness.     Coordination: Coordination normal.     Gait: Gait normal.     Deep Tendon Reflexes: Reflexes normal.  Psychiatric:        Mood and Affect: Mood normal.        Behavior: Behavior normal.        Thought Content: Thought content normal.        Judgment: Judgment normal.    Results for orders placed or performed in visit on 08/11/21  Influenza a and b   Specimen: Nasopharyngeal   Naso  Result Value Ref Range   Influenza A Ag, EIA Negative Negative   Influenza B Ag, EIA Negative Negative   Influenza Comment See note   Novel Coronavirus, NAA ([facility])   Specimen: Saline  Result Value Ref Range   SARS-CoV-2, NAA Detected (A) Not Detected  SARS-COV-2, NAA 2 DAY TAT  Result Value Ref Range   SARS-CoV-2, NAA 2 DAY TAT Performed   Please note:  Result Value Ref Range   Please note: Comment       Assessment &  Plan:   Problem List Items Addressed This Visit       Cardiovascular and Mediastinum   Hot flashes    Under good control on current regimen. Continue current regimen. Continue to monitor. Call with any concerns.  Refills given. Will consider trying to wean down to help with weight loss. Will do this slowly and stop if having more vasomotor symptoms.        RESOLVED: Hypertension    Doing well off medicine. Will resolve off problem list.       Relevant Orders   CBC with Differential/Platelet   Comprehensive metabolic panel   TSH     Other   Mixed hyperlipidemia    Rechecking labs today. Await results. Treat as needed.       Relevant Orders   CBC with Differential/Platelet   Comprehensive metabolic panel   Lipid Panel w/o Chol/HDL Ratio   Other Visit Diagnoses     Routine general medical examination at a health care facility    -  Primary   Vaccines up to date. Screening labs checked today. Pap, Mammo, colonoscopy up to date. Continue diet and exercise. Call with any concerns. Continue to monitor.    Relevant Orders   CBC with Differential/Platelet   Comprehensive metabolic panel   Lipid Panel w/o Chol/HDL Ratio   Urinalysis, Routine w reflex microscopic   TSH   Microalbumin, Urine Waived   Need for shingles vaccine       Shingles #1 given today.   Relevant Orders   Varicella-zoster vaccine IM (Shingrix) (Completed)   Multiple joint pain       Doing well with gabapentin and voltaren. Refills given today.   Relevant Medications   diclofenac (VOLTAREN) 75 MG EC tablet   Family history of diabetes mellitus (DM)       Relevant Orders   Bayer DCA Hb A1c Waived        Follow up plan: Return in about 6 months (around 04/08/2022) for nurse visit in 3 months for 2nd shingles vaccines.   LABORATORY TESTING:  - Pap smear: up to date  IMMUNIZATIONS:   - Tdap: Tetanus vaccination status reviewed: last tetanus booster within 10 years. - Influenza: Refused - Pneumovax:  Not applicable - Prevnar: Not applicable - COVID: Up to date - HPV: Not applicable - Shingrix vaccine: Administered today  SCREENING: -Mammogram: Up to date  - Colonoscopy: Up to date   PATIENT COUNSELING:   Advised to take 1 mg of folate supplement per day if capable of pregnancy.   Sexuality: Discussed sexually transmitted diseases, partner selection, use of condoms, avoidance of unintended pregnancy  and contraceptive alternatives.   Advised to avoid cigarette smoking.  I discussed with the patient that most people either abstain from alcohol or drink within safe limits (<=14/week and <=4 drinks/occasion for males, <=7/weeks and <= 3 drinks/occasion for females) and that the risk for alcohol disorders and other health effects rises proportionally with the number of drinks per week and how often a drinker exceeds daily limits.  Discussed cessation/primary prevention of drug use and availability of treatment for abuse.   Diet: Encouraged to adjust caloric intake to maintain  or achieve ideal body weight, to reduce intake of dietary saturated fat and total fat, to limit sodium intake by avoiding high sodium foods and not adding table salt, and to maintain adequate dietary potassium and calcium preferably from fresh fruits, vegetables, and low-fat dairy products.    stressed the importance of regular exercise  Injury prevention: Discussed safety belts, safety helmets, smoke detector, smoking near bedding or upholstery.   Dental health: Discussed importance of regular tooth brushing, flossing, and dental visits.    NEXT PREVENTATIVE PHYSICAL DUE IN 1 YEAR. Return in about  6 months (around 04/08/2022) for nurse visit in 3 months for 2nd shingles vaccines.

## 2021-10-09 NOTE — Assessment & Plan Note (Addendum)
Under good control on current regimen. Continue current regimen. Continue to monitor. Call with any concerns. Refills given. Will consider trying to wean down to help with weight loss. Will do this slowly and stop if having more vasomotor symptoms.

## 2021-10-09 NOTE — Assessment & Plan Note (Signed)
Doing well off medicine. Will resolve off problem list.

## 2021-10-09 NOTE — Assessment & Plan Note (Signed)
Rechecking labs today. Await results. Treat as needed.  °

## 2021-10-10 LAB — CBC WITH DIFFERENTIAL/PLATELET
Basophils Absolute: 0.1 10*3/uL (ref 0.0–0.2)
Basos: 1 %
EOS (ABSOLUTE): 0.1 10*3/uL (ref 0.0–0.4)
Eos: 1 %
Hematocrit: 44.3 % (ref 34.0–46.6)
Hemoglobin: 14.7 g/dL (ref 11.1–15.9)
Immature Grans (Abs): 0 10*3/uL (ref 0.0–0.1)
Immature Granulocytes: 0 %
Lymphocytes Absolute: 3.2 10*3/uL — ABNORMAL HIGH (ref 0.7–3.1)
Lymphs: 52 %
MCH: 30.6 pg (ref 26.6–33.0)
MCHC: 33.2 g/dL (ref 31.5–35.7)
MCV: 92 fL (ref 79–97)
Monocytes Absolute: 0.3 10*3/uL (ref 0.1–0.9)
Monocytes: 5 %
Neutrophils Absolute: 2.5 10*3/uL (ref 1.4–7.0)
Neutrophils: 41 %
Platelets: 228 10*3/uL (ref 150–450)
RBC: 4.8 x10E6/uL (ref 3.77–5.28)
RDW: 12.2 % (ref 11.7–15.4)
WBC: 6.1 10*3/uL (ref 3.4–10.8)

## 2021-10-10 LAB — LIPID PANEL W/O CHOL/HDL RATIO
Cholesterol, Total: 237 mg/dL — ABNORMAL HIGH (ref 100–199)
HDL: 49 mg/dL (ref >39)
LDL Chol Calc (NIH): 128 mg/dL — ABNORMAL HIGH (ref 0–99)
Triglycerides: 336 mg/dL — ABNORMAL HIGH (ref 0–149)
VLDL Cholesterol Cal: 60 mg/dL — ABNORMAL HIGH (ref 5–40)

## 2021-10-10 LAB — COMPREHENSIVE METABOLIC PANEL
ALT: 53 IU/L — ABNORMAL HIGH (ref 0–32)
AST: 35 IU/L (ref 0–40)
Albumin/Globulin Ratio: 1.8 (ref 1.2–2.2)
Albumin: 4.3 g/dL (ref 3.8–4.9)
Alkaline Phosphatase: 75 IU/L (ref 44–121)
BUN/Creatinine Ratio: 21 (ref 9–23)
BUN: 14 mg/dL (ref 6–24)
Bilirubin Total: 0.3 mg/dL (ref 0.0–1.2)
CO2: 25 mmol/L (ref 20–29)
Calcium: 9.7 mg/dL (ref 8.7–10.2)
Chloride: 102 mmol/L (ref 96–106)
Creatinine, Ser: 0.68 mg/dL (ref 0.57–1.00)
Globulin, Total: 2.4 g/dL (ref 1.5–4.5)
Glucose: 91 mg/dL (ref 70–99)
Potassium: 3.9 mmol/L (ref 3.5–5.2)
Sodium: 140 mmol/L (ref 134–144)
Total Protein: 6.7 g/dL (ref 6.0–8.5)
eGFR: 103 mL/min/{1.73_m2} (ref >59)

## 2021-10-10 LAB — TSH: TSH: 1.72 u[IU]/mL (ref 0.450–4.500)

## 2021-10-16 DIAGNOSIS — M25552 Pain in left hip: Secondary | ICD-10-CM | POA: Diagnosis not present

## 2021-10-16 DIAGNOSIS — M1612 Unilateral primary osteoarthritis, left hip: Secondary | ICD-10-CM | POA: Diagnosis not present

## 2021-10-16 DIAGNOSIS — M7062 Trochanteric bursitis, left hip: Secondary | ICD-10-CM | POA: Diagnosis not present

## 2021-10-17 ENCOUNTER — Telehealth: Payer: BC Managed Care – PPO | Admitting: Physician Assistant

## 2021-10-17 ENCOUNTER — Ambulatory Visit: Payer: Self-pay | Admitting: *Deleted

## 2021-10-17 DIAGNOSIS — B9689 Other specified bacterial agents as the cause of diseases classified elsewhere: Secondary | ICD-10-CM

## 2021-10-17 DIAGNOSIS — J019 Acute sinusitis, unspecified: Secondary | ICD-10-CM | POA: Diagnosis not present

## 2021-10-17 MED ORDER — AMOXICILLIN-POT CLAVULANATE 875-125 MG PO TABS: 1.0000 | ORAL_TABLET | Freq: Two times a day (BID) | ORAL | 0 refills | Status: DC

## 2021-10-17 NOTE — Telephone Encounter (Signed)
It looks like pt is being seen today virtually with telehealth.

## 2021-10-17 NOTE — Progress Notes (Signed)

## 2021-10-17 NOTE — Telephone Encounter (Signed)
I returned pt's call.   She's c/o having "something in her head".   She was on the couch all day Sat.  She is requesting Dr. Wynetta Emery to call in something to make her feel better.   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   She mentioned she had the same thing in Dec.   Reason for Disposition  [1] Sinus congestion (pressure, fullness) AND [2] present > 10 days  Answer Assessment - Initial Assessment Questions 1. ONSET: "When did the nasal discharge start?"      I feel all stopped up.  I have a runny nose.    My voice is raspy.    Coughing a lot at night.   It's a yellow mucus.   No fever.    Covid is negative.    2. AMOUNT: "How much discharge is there?"      *No Answer* 3. COUGH: "Do you have a cough?" If yes, ask: "Describe the color of your sputum" (clear, white, yellow, green)     Yellow mucus.     4. RESPIRATORY DISTRESS: "Describe your breathing."      *No Answer* 5. FEVER: "Do you have a fever?" If Yes, ask: "What is your temperature, how was it measured, and when did it start?"     I feel like I could be.   6. SEVERITY: "Overall, how bad are you feeling right now?" (e.g., doesn't interfere with normal activities, staying home from school/work, staying in bed)      *No Answer* 7. OTHER SYMPTOMS: "Do you have any other symptoms?" (e.g., sore throat, earache, wheezing, vomiting)     *No Answer* 8. PREGNANCY: "Is there any chance you are pregnant?" "When was your last menstrual period?"     *No Answer*  Protocols used: Common Cold-A-AH

## 2021-10-17 NOTE — Progress Notes (Signed)
No show

## 2021-10-17 NOTE — Telephone Encounter (Signed)
°  Chief Complaint: sinus congestion and coughing Symptoms: Above Frequency: Started Sat.  Pertinent Negatives: Patient denies having Covid  Test is negative Disposition: [] ED /[] Urgent Care (no appt availability in office) / [] Appointment(In office/virtual)/ []  [facility]/ [] Home Care/ [] Refused Recommended Disposition /[] [facility]/ []  Follow-up with PCP Additional Notes: I have sent a message to the office to see if she can be seen sooner.   Pt agreeable to this plan

## 2021-10-30 DIAGNOSIS — R079 Chest pain, unspecified: Secondary | ICD-10-CM | POA: Diagnosis not present

## 2021-10-30 DIAGNOSIS — R0789 Other chest pain: Secondary | ICD-10-CM | POA: Diagnosis not present

## 2021-10-31 ENCOUNTER — Telehealth: Payer: Self-pay | Admitting: *Deleted

## 2021-10-31 DIAGNOSIS — R079 Chest pain, unspecified: Secondary | ICD-10-CM | POA: Diagnosis not present

## 2021-10-31 NOTE — Telephone Encounter (Signed)
Transition Care Management Unsuccessful Follow-up Telephone Call ? ?Date of discharge and from where:  [facility]  10-30-2021 ? ?Attempts:  1st Attempt ? ?Reason for unsuccessful TCM follow-up call:  Left voice message ? ?  ?

## 2021-11-01 NOTE — Telephone Encounter (Signed)
Transition Care Management Unsuccessful Follow-up Telephone Call ?Date of discharge and from where:   [facility] ? ?Attempts:  2nd Attempt ? ?Reason for unsuccessful TCM follow-up call:  Left voice message ? ?  ?

## 2021-11-02 NOTE — Telephone Encounter (Signed)
Transition Care Management Follow-up Telephone Call ?Date of discharge and from where: [facility]  10-30-2021 ?How have you been since you were released from the hospital? Feeling ok still have some spells ?Any questions or concerns? No ? ?Items Reviewed: ?Did the pt receive and understand the discharge instructions provided? Yes  ?Medications obtained and verified? Yes  ?Other? No  ?Any new allergies since your discharge? No  ?Dietary orders reviewed? No ?Do you have support at home? Yes  ? ?Home Care and Equipment/Supplies: ?Were home health services ordered? not applicable ?If so, what is the name of the agency?   ?Has the agency set up a time to come to the patient's home? not applicable ?Were any new equipment or medical supplies ordered?   ?What is the name of the medical supply agency?  ?Were you able to get the supplies/equipment? not applicable ?Do you have any questions related to the use of the equipment or supplies? No ? ?Functional Questionnaire: (I = Independent and D = Dependent) ?ADLs: I ? ?Bathing/Dressing- I ? ?Meal Prep- I ? ?Eating- I ? ?Maintaining continence- I ? ?Transferring/Ambulation- I ? ?Managing Meds- I ? ?Follow up appointments reviewed: ? ?PCP Hospital f/u appt confirmed? No  PATIENT WILL CALL AFTER CARDIOLOGY APPOINTMENT  ?[facility] f/u appt confirmed? Yes  Scheduled to see Cardiology 11-07-2011  ?Are transportation arrangements needed? No  ?If their condition worsens, is the pt aware to call PCP or go to the Emergency Dept.? Yes ?Was the patient provided with contact information for the PCP's office or ED? Yes ?Was to pt encouraged to call back with questions or concerns? Yes  ?

## 2021-11-05 NOTE — Progress Notes (Signed)
Date:  11/05/2021   ID:  [name], DOB [birth year], MRN [id number]  Patient Location:  [place] [address]   Provider location:   [facility], [place] office  PCP:  Valerie Roys, DO  Cardiologist:  [facility]  No chief complaint on file.   History of Present Illness:    Tracey Weaver is a 56 y.o. 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 The most 10/30/21 episode  was the worst  The episodes last from a few minutes to about 20 minutes.  EKG personally reviewed by myself on todays visit Normal sinus rhythm rate 76 bpm no significant ST-T wave changes  Lab work reviewed with her Total chol 188, LDL 82 in 02/2017   Had previous stress test Stress echocardiogram June 2016 in [place], performed for chest pain Stress test with no ischemia, normal LV function   No prior smoking history Both parents with significant coronary  disease   Past Medical History:  Diagnosis Date   Adjustment disorder with depressed mood    Anxiety    Cervical cancer ([place])    precancerous cells   Chest pain    Constipation    Degenerative disc disease, cervical    Dysplastic nevus 08/27/2013   s/p resection by Dr. Aubery Lapping.   Dysthymic disorder    Headache    from pinched nerve in neck   History of ectopic pregnancy    Multiple   Hypertension 05/19/2018   IBS (irritable bowel syndrome)    Constipation; Rx for Amitiza worked well.   Interstitial cystitis    [facility]; s/p bladder biopsy; s/p cystoscopy x 3 in 2015.   Kidney stones    Liver function study, abnormal    Neck stiffness    limited left turn   Pneumonia    PONV (postoperative nausea and vomiting)    Pyelonephritis    Recurrent UTI    Splenomegaly    s/p GI consult and hematology consult   Ulcer 09/28/2011   EGD: +gastric ulcer; rx for Dexilant.  [name].   Vertigo    Past Surgical History:  Procedure Laterality Date   ABDOMINAL EXPLORATION SURGERY  08/28/1995   ruptured tube from eptopic pregnancy   CESAREAN SECTION     x 2.   COLONOSCOPY  08/27/2010   [name].     COLONOSCOPY WITH PROPOFOL N/A 11/23/2019   Procedure: COLONOSCOPY WITH PROPOFOL;  Surgeon: Lucilla Lame, MD;  Location: [place];  Service: Endoscopy;  Laterality: N/A;   CYSTOSTOMY W/ BLADDER BIOPSY  05/27/2014   [name]. Benign.   ESOPHAGOGASTRODUODENOSCOPY  09/28/2011   +gastric ulcer.  [name].   TONSILLECTOMY  05/27/2014   [facility].   TOTAL ABDOMINAL HYSTERECTOMY  08/27/1998   cervical dysplasia;one ovary remaining     No outpatient medications have been marked as taking for the 11/06/21 encounter (Appointment) with Minna Merritts, MD.     Allergies:   Meloxicam   Social History   Tobacco Use   Smoking status: Never   Smokeless tobacco: Never  Vaping Use   Vaping Use: Never used  Substance Use Topics   Alcohol use: Yes    Alcohol/week: 0.0 standard  drinks    Comment: 1-2 drinks per month   Drug use: No     Current Outpatient Medications on File Prior to Visit  Medication Sig Dispense Refill   amoxicillin-clavulanate (AUGMENTIN) 875-125 MG tablet Take 1 tablet by mouth 2 (two) times daily. 14 tablet 0   Ascorbic Acid (VITAMIN C PO) Take by mouth daily.     CASCARA SAGRADA PO Take by mouth.     Cyanocobalamin 2500 MCG SUBL Place under the tongue daily.      diclofenac (VOLTAREN) 75 MG EC tablet Take 1 tablet (75 mg total) by mouth 2 (two) times daily as needed. 180 tablet 3   estradiol (ESTRACE) 0.5 MG tablet TAKE 1 & 1/2 TABLETS (0.75 MG TOTAL) BY MOUTH DAILY. 135 tablet 3   gabapentin (NEURONTIN) 100 MG capsule Take 1 capsule (100 mg total) by mouth at bedtime. 90 capsule 3   Glucosamine 500 MG CAPS Take 1,000 mg by mouth daily.     meclizine (ANTIVERT) 25 MG tablet Take 1 tablet (25 mg total) by mouth 3 (three) times daily as needed for dizziness. 30 tablet 0   Multiple Vitamin (MULTIVITAMIN) capsule Take by mouth daily.      RETIN-A MICRO PUMP 0.06 % GEL APPLY 1 APPLICATION ON THE SKIN AT NIGHT/BEDTIME  0   scopolamine (TRANSDERM-SCOP, 1.5 MG,) 1 MG/3DAYS Place 1 patch (1.5 mg total) onto the skin every 3 (three) days. 10 patch 12   Turmeric (QC TUMERIC COMPLEX PO) Take 1,000 mg by mouth daily.     VITAMIN D PO Take by mouth daily. Gummies     No current facility-administered medications on file prior to visit.     Family Hx:  family history includes Alcohol abuse in her mother; Breast cancer in her paternal grandmother; COPD in her mother; Cancer in her father and paternal grandmother; Cancer (age of onset: 29) in her brother; Colon polyps in her mother; Diabetes in her father, paternal grandmother, and son; Drug abuse in her brother; Epilepsy in her son; Heart attack in her mother; Heart disease in her maternal grandfather, maternal grandmother, paternal grandfather, and paternal grandmother; Heart disease (age of onset: 19) in  her mother; Heart disease (age of onset: 20) in her father; Hyperlipidemia in her brother, father, and mother; Hypertension in her brother, father, maternal grandfather, maternal grandmother, and mother; Hypothyroidism in her brother; Mental illness in her brother; Stroke in her maternal grandfather, maternal grandmother, and paternal grandfather.  ROS:   Please see the history of present illness.    Review of Systems  Constitutional: Negative.   HENT: Negative.    Respiratory: Negative.    Cardiovascular:  Positive for chest pain.  Gastrointestinal: Negative.   Musculoskeletal: Negative.   Neurological: Negative.  Psychiatric/Behavioral: Negative.    All other systems reviewed and are negative.   Labs/Other Tests and Data Reviewed:    Recent Labs: 10/09/2021: ALT 53; BUN 14; Creatinine, Ser 0.68; Hemoglobin 14.7; Platelets 228; Potassium 3.9; Sodium 140; TSH 1.720   Recent Lipid Panel Lab Results  Component Value Date/Time   CHOL 237 (H) 10/09/2021 11:38 AM   TRIG 336 (H) 10/09/2021 11:38 AM   HDL 49 10/09/2021 11:38 AM   CHOLHDL 4.5 11/24/2014 03:10 PM   LDLCALC 128 (H) 10/09/2021 11:38 AM    Wt Readings from Last 3 Encounters:  10/09/21 167 lb 3.2 oz (75.8 kg)  08/11/21 169 lb (76.7 kg)  04/03/21 167 lb 12.8 oz (76.1 kg)     Exam:    BP 136/88 (BP Location: Left Arm, Patient Position: Sitting, Cuff Size: Normal)    Pulse 76    Ht '5\' 6"'$  (1.676 m)    Wt 167 lb (75.8 kg)    SpO2 98%    BMI 26.95 kg/m  Constitutional:  oriented to person, place, and time. No distress.  HENT:  Head: Grossly normal Eyes:  no discharge. No scleral icterus.  Neck: No JVD, no carotid bruits  Cardiovascular: Regular rate and rhythm, no murmurs appreciated Pulmonary/Chest: Clear to auscultation bilaterally, no wheezes or rails Abdominal: Soft.  no distension.  no tenderness.  Musculoskeletal: Normal range of motion Neurological:  normal muscle tone. Coordination normal. No atrophy Skin: Skin  warm and dry Psychiatric: normal affect, pleasant  ASSESSMENT & PLAN:    Chest pain, unspecified type /angina Worsening chest pain past 2 weeks typically brought on with exertion, symptoms concerning for angina Strong family history of coronary disease We have ordered cardiac CTA   Mixed hyperlipidemia Cholesterol numbers have trended upwards, appears to be off her Crestor 10 mg daily Cardiac CTA as above for guidance   Paroxysmal tachycardia (HCC) No recent episodes, no further work-up needed   Family history of premature coronary artery disease Cardiac CTA ordered as above    Total encounter time more than 30 minutes  Greater than 50% was spent in counseling and coordination of care with the patient   Signed, Ida Rogue, MD  11/05/2021 6:33 PM    [facility] [address], [place], [place] [address]

## 2021-11-06 ENCOUNTER — Ambulatory Visit (INDEPENDENT_AMBULATORY_CARE_PROVIDER_SITE_OTHER): Payer: BC Managed Care – PPO | Admitting: Cardiovascular Disease

## 2021-11-06 ENCOUNTER — Other Ambulatory Visit: Payer: Self-pay

## 2021-11-06 ENCOUNTER — Encounter: Payer: Self-pay | Admitting: Cardiovascular Disease

## 2021-11-06 VITALS — BP 136/88 | HR 76 | Ht 66.0 in | Wt 167.0 lb

## 2021-11-06 DIAGNOSIS — R0789 Other chest pain: Secondary | ICD-10-CM | POA: Diagnosis not present

## 2021-11-06 DIAGNOSIS — I479 Paroxysmal tachycardia, unspecified: Secondary | ICD-10-CM

## 2021-11-06 DIAGNOSIS — R079 Chest pain, unspecified: Secondary | ICD-10-CM | POA: Diagnosis not present

## 2021-11-06 DIAGNOSIS — E782 Mixed hyperlipidemia: Secondary | ICD-10-CM

## 2021-11-06 DIAGNOSIS — R0602 Shortness of breath: Secondary | ICD-10-CM

## 2021-11-06 DIAGNOSIS — I209 Angina pectoris, unspecified: Secondary | ICD-10-CM | POA: Diagnosis not present

## 2021-11-06 MED ORDER — METOPROLOL TARTRATE 100 MG PO TABS
100.0000 mg | ORAL_TABLET | Freq: Once | ORAL | 0 refills | Status: DC
Start: 2021-11-06 — End: 2022-04-09

## 2021-11-06 NOTE — Patient Instructions (Signed)
Medication Instructions:  ?No changes ? ?If you need a refill on your cardiac medications before your next appointment, please call your pharmacy.  ? ?Lab work: ?No new labs needed ? ?Testing/Procedures: ?Cardiac CTA ? ?Follow-Up: ?At [facility], you and your health needs are our priority.  As part of our continuing mission to provide you with exceptional heart care, we have created designated Provider Care Teams.  These Care Teams include your primary Cardiologist (physician) and Advanced Practice Providers (APPs -  Physician Assistants and Nurse Practitioners) who all work together to provide you with the care you need, when you need it. ? ?You will need a follow up appointment as needed ? ?Providers on your designated Care Team:   ?Murray Hodgkins, NP ?Christell Faith, [name] ?Cadence Kathlen Mody, [name] ? ?COVID-19 Vaccine Information can be found at: ShippingScam.co.uk For questions related to vaccine distribution or appointments, please email [email or web address] or call [phone].  ? ? ?Cardiac (coronary) CTA  ? ?[facility] ?[address] ?[address] ?[place], [place] [address] ?[phone] ? ?Date:   Time:  [place] will call you with a date and time ? ?Please arrive 15 mins early for check-in and test prep. ? ? ?Hold all erectile dysfunction medications at least 3 days (72 hrs) prior to test. ? ?On the Night Before the Test: ?Be sure to Drink plenty of water. ?Do not consume any caffeinated/decaffeinated beverages or chocolate 12 hours prior to your test. ?This includes decaf as well ? ?On the Day of the Test: ?Drink plenty of water until 1 hour prior to the test. ?Do not eat any food 4 hours prior to the test. ?You may take your regular medications prior to the test.  ?Take metoprolol (Lopressor) two hours prior to test. ?Lopressor 100 mg ?This was sent in to your pharmacy for pick-up ?HOLD  Furosemide/Hydrochlorothiazide morning of the test. ?No fluid pills ?FEMALES- please wear underwire-free bra if available ?     ?After the Test: ?Drink plenty of water. ?After receiving IV contrast, you may experience a mild flushed feeling. This is normal. ?On occasion, you may experience a mild rash up to 24 hours after the test. This is not dangerous. If this occurs, you can take Benadryl 25 mg and increase your fluid intake (to flush out the kidneys) ?If you experience trouble breathing, this can be serious. If it is severe call 911 IMMEDIATELY. If it is mild, please call our office. ?If you take any of these medications:  ?Glipizide/Metformin ?Avandament,  ?Glucavance,  ?please do not take 48 hours after completing test unless otherwise instructed. ? ? ?Once we have confirmed authorization from your insurance company, we will call you to set up a date and time for your test. Based on how quickly your insurance processes prior authorizations requests, please allow up to 4 weeks to be contacted for scheduling your Cardiac CT appointment. Be advised that routine Cardiac CT appointments could be scheduled as many as 8 weeks after your provider has ordered it. ? ? ?For scheduling needs, including cancellations and rescheduling, please call [place], [phone]. ? ?

## 2021-11-10 ENCOUNTER — Telehealth (HOSPITAL_COMMUNITY): Payer: Self-pay | Admitting: Emergency Medicine

## 2021-11-10 NOTE — Telephone Encounter (Signed)
Reaching out to patient to offer assistance regarding upcoming cardiac imaging study; pt verbalizes understanding of appt date/time, parking situation and where to check in, pre-test NPO status and medications ordered, and verified current allergies; name and call back number provided for further questions should they arise ?Marchia Bond RN Navigator Cardiac Imaging ?[facility] ?[phone] office ?[phone] cell ? ?Denies iv issues ?'100mg'$  metoprolol tartrate  ?Arrival 845 ?

## 2021-11-13 ENCOUNTER — Ambulatory Visit
Admission: RE | Admit: 2021-11-13 | Discharge: 2021-11-13 | Disposition: A | Discharge status: Home / Self Care | Payer: BC Managed Care – PPO | Source: Ambulatory Visit | Attending: Cardiovascular Disease | Admitting: Cardiovascular Disease

## 2021-11-13 ENCOUNTER — Other Ambulatory Visit: Payer: Self-pay

## 2021-11-13 DIAGNOSIS — R0602 Shortness of breath: Secondary | ICD-10-CM | POA: Diagnosis not present

## 2021-11-13 DIAGNOSIS — R079 Chest pain, unspecified: Secondary | ICD-10-CM | POA: Insufficient documentation

## 2021-11-13 DIAGNOSIS — H02834 Dermatochalasis of left upper eyelid: Secondary | ICD-10-CM | POA: Diagnosis not present

## 2021-11-13 DIAGNOSIS — H02831 Dermatochalasis of right upper eyelid: Secondary | ICD-10-CM | POA: Diagnosis not present

## 2021-11-13 DIAGNOSIS — I209 Angina pectoris, unspecified: Secondary | ICD-10-CM | POA: Insufficient documentation

## 2021-11-13 DIAGNOSIS — L905 Scar conditions and fibrosis of skin: Secondary | ICD-10-CM | POA: Diagnosis not present

## 2021-11-13 MED ORDER — IOHEXOL 350 MG/ML SOLN
80.0000 mL | Freq: Once | INTRAVENOUS | Status: AC | PRN
  Administered 2021-11-13: 80 mL via INTRAVENOUS

## 2021-11-13 MED ORDER — NITROGLYCERIN 0.4 MG SL SUBL
0.8000 mg | SUBLINGUAL_TABLET | Freq: Once | SUBLINGUAL | Status: AC
  Administered 2021-11-13: 0.8 mg via SUBLINGUAL

## 2021-11-13 NOTE — Progress Notes (Signed)
Patient tolerated procedure well. Ambulate w/o difficulty. Denies any lightheadedness or being dizzy. Pt denies any pain at this time. Sitting in chair, drinking water provided. P is encouraged to drink additional water throughout the day and reason explained to patient. Patient verbalized understanding and all questions answered. ABC intact. No further needs at this time. Discharge from procedure area w/o issues.  °

## 2022-01-05 ENCOUNTER — Ambulatory Visit: Payer: BC Managed Care – PPO

## 2022-02-13 DIAGNOSIS — N951 Menopausal and female climacteric states: Secondary | ICD-10-CM | POA: Diagnosis not present

## 2022-02-13 DIAGNOSIS — R635 Abnormal weight gain: Secondary | ICD-10-CM | POA: Diagnosis not present

## 2022-02-13 DIAGNOSIS — R5382 Chronic fatigue, unspecified: Secondary | ICD-10-CM | POA: Diagnosis not present

## 2022-02-15 DIAGNOSIS — Z1339 Encounter for screening examination for other mental health and behavioral disorders: Secondary | ICD-10-CM | POA: Diagnosis not present

## 2022-02-15 DIAGNOSIS — R635 Abnormal weight gain: Secondary | ICD-10-CM | POA: Diagnosis not present

## 2022-02-15 DIAGNOSIS — N951 Menopausal and female climacteric states: Secondary | ICD-10-CM | POA: Diagnosis not present

## 2022-02-15 DIAGNOSIS — Z1331 Encounter for screening for depression: Secondary | ICD-10-CM | POA: Diagnosis not present

## 2022-03-01 DIAGNOSIS — R5382 Chronic fatigue, unspecified: Secondary | ICD-10-CM | POA: Diagnosis not present

## 2022-03-01 DIAGNOSIS — Z6828 Body mass index (BMI) 28.0-28.9, adult: Secondary | ICD-10-CM | POA: Diagnosis not present

## 2022-03-14 DIAGNOSIS — Z6828 Body mass index (BMI) 28.0-28.9, adult: Secondary | ICD-10-CM | POA: Diagnosis not present

## 2022-03-14 DIAGNOSIS — R5382 Chronic fatigue, unspecified: Secondary | ICD-10-CM | POA: Diagnosis not present

## 2022-03-22 DIAGNOSIS — M2550 Pain in unspecified joint: Secondary | ICD-10-CM | POA: Diagnosis not present

## 2022-03-22 DIAGNOSIS — Z6828 Body mass index (BMI) 28.0-28.9, adult: Secondary | ICD-10-CM | POA: Diagnosis not present

## 2022-03-22 DIAGNOSIS — R61 Generalized hyperhidrosis: Secondary | ICD-10-CM | POA: Diagnosis not present

## 2022-04-02 DIAGNOSIS — Z6827 Body mass index (BMI) 27.0-27.9, adult: Secondary | ICD-10-CM | POA: Diagnosis not present

## 2022-04-02 DIAGNOSIS — R5382 Chronic fatigue, unspecified: Secondary | ICD-10-CM | POA: Diagnosis not present

## 2022-04-09 ENCOUNTER — Ambulatory Visit (INDEPENDENT_AMBULATORY_CARE_PROVIDER_SITE_OTHER): Payer: BC Managed Care – PPO | Admitting: Family Medicine

## 2022-04-09 ENCOUNTER — Encounter: Payer: Self-pay | Admitting: Family Medicine

## 2022-04-09 ENCOUNTER — Ambulatory Visit
Admission: RE | Admit: 2022-04-09 | Discharge: 2022-04-09 | Disposition: A | Discharge status: Home / Self Care | Payer: BC Managed Care – PPO | Source: Ambulatory Visit | Attending: Family Medicine | Admitting: Family Medicine

## 2022-04-09 ENCOUNTER — Ambulatory Visit
Admission: RE | Admit: 2022-04-09 | Discharge: 2022-04-09 | Disposition: A | Discharge status: Home / Self Care | Payer: BC Managed Care – PPO | Attending: Family Medicine | Admitting: Family Medicine

## 2022-04-09 VITALS — BP 111/72 | HR 73 | Temp 98.3°F | Wt 166.9 lb

## 2022-04-09 DIAGNOSIS — C8581 Other specified types of non-Hodgkin lymphoma, lymph nodes of head, face, and neck: Secondary | ICD-10-CM

## 2022-04-09 DIAGNOSIS — R10A2 Flank pain, left side: Secondary | ICD-10-CM

## 2022-04-09 DIAGNOSIS — R300 Dysuria: Secondary | ICD-10-CM | POA: Diagnosis not present

## 2022-04-09 DIAGNOSIS — R109 Unspecified abdominal pain: Secondary | ICD-10-CM

## 2022-04-09 DIAGNOSIS — E782 Mixed hyperlipidemia: Secondary | ICD-10-CM

## 2022-04-09 DIAGNOSIS — K5900 Constipation, unspecified: Secondary | ICD-10-CM | POA: Diagnosis not present

## 2022-04-09 LAB — URINALYSIS, ROUTINE W REFLEX MICROSCOPIC
Bilirubin, UA: NEGATIVE
Glucose, UA: NEGATIVE
Ketones, UA: NEGATIVE
Leukocytes,UA: NEGATIVE
Nitrite, UA: NEGATIVE
Protein,UA: NEGATIVE
RBC, UA: NEGATIVE
Specific Gravity, UA: 1.025 (ref 1.005–1.030)
Urobilinogen, Ur: 0.2 mg/dL (ref 0.2–1.0)
pH, UA: 7 (ref 5.0–7.5)

## 2022-04-09 NOTE — Assessment & Plan Note (Signed)
Rechecking labs today. Await results. Treat as needed.  °

## 2022-04-09 NOTE — Assessment & Plan Note (Signed)
Checking labs today. Await results. Treat as needed.  

## 2022-04-09 NOTE — Progress Notes (Signed)
BP 111/72   Pulse 73   Temp 98.3 F (36.8 C)   Wt 166 lb 14.4 oz (75.7 kg)   BMI 26.94 kg/m    Subjective:    Patient ID: Tracey Weaver, female    DOB: [birth year], 56 y.o.   MRN: [id number]  HPI: Tracey Weaver is a 56 y.o. female  Chief Complaint  Patient presents with   Hyperlipidemia   Back Pain    Patient states she get an ache sometimes in her lower left back, patient states its almost the same feeling as when she had a kidney stone    HYPERLIPIDEMIA Hyperlipidemia status: stable Satisfied with current treatment?  yes Side effects:  not on anything Past cholesterol meds: none Supplements: none Aspirin:  no The 10-year ASCVD risk score (Arnett DK, et al., 2019) is: 2%   Values used to calculate the score:     Age: 25 years     Sex: Female     Is Non-Hispanic African American: No     Diabetic: No     Tobacco smoker: No     Systolic Blood Pressure: 027 mmHg     Is BP treated: No     HDL Cholesterol: 49 mg/dL     Total Cholesterol: 237 mg/dL Chest pain:  no Coronary artery disease:  no  BACK PAIN Duration: 3 months Mechanism of injury: unknown Location: Left and low back Onset: gradual Severity: 3/10 Quality: dull aching Frequency: coming and going Radiation: none Aggravating factors: worse with intercourse, standing, sitting Alleviating factors: ibuprofen Status: fluctuating Treatments attempted: ibuprofen, rest  Relief with NSAIDs?: moderate Nighttime pain:  no Paresthesias / decreased sensation:  no Bowel / bladder incontinence:  no Fevers:  no Dysuria / urinary frequency:  no  Relevant past medical, surgical, family and social history reviewed and updated as indicated. Interim medical history since our last visit reviewed. Allergies and medications reviewed and updated.  Review of Systems  Constitutional: Negative.   Respiratory: Negative.    Cardiovascular: Negative.   Gastrointestinal: Negative.   Genitourinary: Negative.    Musculoskeletal:  Positive for back pain and myalgias. Negative for arthralgias, gait problem, joint swelling, neck pain and neck stiffness.  Skin: Negative.   Psychiatric/Behavioral: Negative.      Per HPI unless specifically indicated above     Objective:    BP 111/72   Pulse 73   Temp 98.3 F (36.8 C)   Wt 166 lb 14.4 oz (75.7 kg)   BMI 26.94 kg/m   Wt Readings from Last 3 Encounters:  04/09/22 166 lb 14.4 oz (75.7 kg)  11/06/21 167 lb (75.8 kg)  10/09/21 167 lb 3.2 oz (75.8 kg)    Physical Exam Vitals and nursing note reviewed.  Constitutional:      General: She is not in acute distress.    Appearance: Normal appearance. She is not ill-appearing, toxic-appearing or diaphoretic.  HENT:     Head: Normocephalic and atraumatic.     Right Ear: External ear normal.     Left Ear: External ear normal.     Nose: Nose normal.     Mouth/Throat:     Mouth: Mucous membranes are moist.     Pharynx: Oropharynx is clear.  Eyes:     General: No scleral icterus.       Right eye: No discharge.        Left eye: No discharge.     Extraocular Movements: Extraocular movements intact.  Conjunctiva/sclera: Conjunctivae normal.     Pupils: Pupils are equal, round, and reactive to light.  Cardiovascular:     Rate and Rhythm: Normal rate and regular rhythm.     Pulses: Normal pulses.     Heart sounds: Normal heart sounds. No murmur heard.    No friction rub. No gallop.  Pulmonary:     Effort: Pulmonary effort is normal. No respiratory distress.     Breath sounds: Normal breath sounds. No stridor. No wheezing, rhonchi or rales.  Chest:     Chest wall: No tenderness.  Abdominal:     General: Abdomen is flat. Bowel sounds are normal. There is no distension.     Palpations: Abdomen is soft. There is no mass.     Tenderness: There is no abdominal tenderness. There is left CVA tenderness. There is no right CVA tenderness, guarding or rebound.     Hernia: No hernia is present.   Musculoskeletal:        General: Normal range of motion.     Cervical back: Normal range of motion and neck supple.  Skin:    General: Skin is warm and dry.     Capillary Refill: Capillary refill takes less than 2 seconds.     Coloration: Skin is not jaundiced or pale.     Findings: No bruising, erythema, lesion or rash.  Neurological:     General: No focal deficit present.     Mental Status: She is alert and oriented to person, place, and time. Mental status is at baseline.  Psychiatric:        Mood and Affect: Mood normal.        Behavior: Behavior normal.        Thought Content: Thought content normal.        Judgment: Judgment normal.     Results for orders placed or performed in visit on 10/09/21  CBC with Differential/Platelet  Result Value Ref Range   WBC 6.1 3.4 - 10.8 x10E3/uL   RBC 4.80 3.77 - 5.28 x10E6/uL   Hemoglobin 14.7 11.1 - 15.9 g/dL   Hematocrit 44.3 34.0 - 46.6 %   MCV 92 79 - 97 fL   MCH 30.6 26.6 - 33.0 pg   MCHC 33.2 31.5 - 35.7 g/dL   RDW 12.2 11.7 - 15.4 %   Platelets 228 150 - 450 x10E3/uL   Neutrophils 41 Not Estab. %   Lymphs 52 Not Estab. %   Monocytes 5 Not Estab. %   Eos 1 Not Estab. %   Basos 1 Not Estab. %   Neutrophils Absolute 2.5 1.4 - 7.0 x10E3/uL   Lymphocytes Absolute 3.2 (H) 0.7 - 3.1 x10E3/uL   Monocytes Absolute 0.3 0.1 - 0.9 x10E3/uL   EOS (ABSOLUTE) 0.1 0.0 - 0.4 x10E3/uL   Basophils Absolute 0.1 0.0 - 0.2 x10E3/uL   Immature Granulocytes 0 Not Estab. %   Immature Grans (Abs) 0.0 0.0 - 0.1 x10E3/uL  Comprehensive metabolic panel  Result Value Ref Range   Glucose 91 70 - 99 mg/dL   BUN 14 6 - 24 mg/dL   Creatinine, Ser 0.68 0.57 - 1.00 mg/dL   eGFR 103 >59 mL/min/1.73   BUN/Creatinine Ratio 21 9 - 23   Sodium 140 134 - 144 mmol/L   Potassium 3.9 3.5 - 5.2 mmol/L   Chloride 102 96 - 106 mmol/L   CO2 25 20 - 29 mmol/L   Calcium 9.7 8.7 - 10.2 mg/dL   Total Protein 6.7 6.0 -  8.5 g/dL   Albumin 4.3 3.8 - 4.9 g/dL    Globulin, Total 2.4 1.5 - 4.5 g/dL   Albumin/Globulin Ratio 1.8 1.2 - 2.2   Bilirubin Total 0.3 0.0 - 1.2 mg/dL   Alkaline Phosphatase 75 44 - 121 IU/L   AST 35 0 - 40 IU/L   ALT 53 (H) 0 - 32 IU/L  Lipid Panel w/o Chol/HDL Ratio  Result Value Ref Range   Cholesterol, Total 237 (H) 100 - 199 mg/dL   Triglycerides 336 (H) 0 - 149 mg/dL   HDL 49 >39 mg/dL   VLDL Cholesterol Cal 60 (H) 5 - 40 mg/dL   LDL Chol Calc (NIH) 128 (H) 0 - 99 mg/dL  Urinalysis, Routine w reflex microscopic  Result Value Ref Range   Specific Gravity, UA 1.025 1.005 - 1.030   pH, UA 6.0 5.0 - 7.5   Color, UA Yellow Yellow   Appearance Ur Cloudy (A) Clear   Leukocytes,UA Negative Negative   Protein,UA Negative Negative/Trace   Glucose, UA Negative Negative   Ketones, UA Negative Negative   RBC, UA Negative Negative   Bilirubin, UA Negative Negative   Urobilinogen, Ur 0.2 0.2 - 1.0 mg/dL   Nitrite, UA Negative Negative  TSH  Result Value Ref Range   TSH 1.720 0.450 - 4.500 uIU/mL  Microalbumin, Urine Waived  Result Value Ref Range   Microalb, Ur Waived 10 0 - 19 mg/L   Creatinine, Urine Waived 100 10 - 300 mg/dL   Microalb/Creat Ratio <30 <30 mg/g  Bayer DCA Hb A1c Waived  Result Value Ref Range   HB A1C (BAYER DCA - WAIVED) 5.1 4.8 - 5.6 %      Assessment & Plan:   Problem List Items Addressed This Visit       Immune and Lymphatic   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 Await results. Treat as needed.       Relevant Orders   Comprehensive metabolic panel   CBC with Differential/Platelet     Other   Mixed hyperlipidemia    Rechecking labs today. Await results. Treat as needed.       Relevant Orders   Comprehensive metabolic panel   Lipid Panel w/o Chol/HDL Ratio   Other Visit Diagnoses     Left flank pain    -  Primary   Concern for kidney stone. Trace blood. Will obtain KUB. Treat as needed.    Relevant Orders   DG Abd 1 View   Dysuria        Trace blood.   Relevant Orders   Urinalysis, Routine w reflex microscopic        Follow up plan: Return in about 6 months (around 10/10/2022) for physical.

## 2022-04-10 LAB — COMPREHENSIVE METABOLIC PANEL
ALT: 81 IU/L — ABNORMAL HIGH (ref 0–32)
AST: 32 IU/L (ref 0–40)
Albumin/Globulin Ratio: 2.1 (ref 1.2–2.2)
Albumin: 4.4 g/dL (ref 3.8–4.9)
Alkaline Phosphatase: 84 IU/L (ref 44–121)
BUN/Creatinine Ratio: 21 (ref 9–23)
BUN: 14 mg/dL (ref 6–24)
Bilirubin Total: 0.4 mg/dL (ref 0.0–1.2)
CO2: 23 mmol/L (ref 20–29)
Calcium: 9.4 mg/dL (ref 8.7–10.2)
Chloride: 104 mmol/L (ref 96–106)
Creatinine, Ser: 0.66 mg/dL (ref 0.57–1.00)
Globulin, Total: 2.1 g/dL (ref 1.5–4.5)
Glucose: 91 mg/dL (ref 70–99)
Potassium: 4.1 mmol/L (ref 3.5–5.2)
Sodium: 141 mmol/L (ref 134–144)
Total Protein: 6.5 g/dL (ref 6.0–8.5)
eGFR: 104 mL/min/{1.73_m2} (ref >59)

## 2022-04-10 LAB — CBC WITH DIFFERENTIAL/PLATELET
Basophils Absolute: 0.1 10*3/uL (ref 0.0–0.2)
Basos: 1 %
EOS (ABSOLUTE): 0.1 10*3/uL (ref 0.0–0.4)
Eos: 2 %
Hematocrit: 43.3 % (ref 34.0–46.6)
Hemoglobin: 14.1 g/dL (ref 11.1–15.9)
Immature Grans (Abs): 0 10*3/uL (ref 0.0–0.1)
Immature Granulocytes: 0 %
Lymphocytes Absolute: 2.5 10*3/uL (ref 0.7–3.1)
Lymphs: 47 %
MCH: 30.7 pg (ref 26.6–33.0)
MCHC: 32.6 g/dL (ref 31.5–35.7)
MCV: 94 fL (ref 79–97)
Monocytes Absolute: 0.3 10*3/uL (ref 0.1–0.9)
Monocytes: 6 %
Neutrophils Absolute: 2.3 10*3/uL (ref 1.4–7.0)
Neutrophils: 44 %
Platelets: 210 10*3/uL (ref 150–450)
RBC: 4.59 x10E6/uL (ref 3.77–5.28)
RDW: 11.9 % (ref 11.7–15.4)
WBC: 5.2 10*3/uL (ref 3.4–10.8)

## 2022-04-10 LAB — LIPID PANEL W/O CHOL/HDL RATIO
Cholesterol, Total: 232 mg/dL — ABNORMAL HIGH (ref 100–199)
HDL: 40 mg/dL (ref >39)
LDL Chol Calc (NIH): 110 mg/dL — ABNORMAL HIGH (ref 0–99)
Triglycerides: 475 mg/dL — ABNORMAL HIGH (ref 0–149)
VLDL Cholesterol Cal: 82 mg/dL — ABNORMAL HIGH (ref 5–40)

## 2022-04-16 DIAGNOSIS — R5382 Chronic fatigue, unspecified: Secondary | ICD-10-CM | POA: Diagnosis not present

## 2022-04-16 DIAGNOSIS — Z6827 Body mass index (BMI) 27.0-27.9, adult: Secondary | ICD-10-CM | POA: Diagnosis not present

## 2022-04-23 DIAGNOSIS — Z6827 Body mass index (BMI) 27.0-27.9, adult: Secondary | ICD-10-CM | POA: Diagnosis not present

## 2022-04-23 DIAGNOSIS — M2550 Pain in unspecified joint: Secondary | ICD-10-CM | POA: Diagnosis not present

## 2022-05-07 DIAGNOSIS — Z86018 Personal history of other benign neoplasm: Secondary | ICD-10-CM | POA: Diagnosis not present

## 2022-05-07 DIAGNOSIS — L578 Other skin changes due to chronic exposure to nonionizing radiation: Secondary | ICD-10-CM | POA: Diagnosis not present

## 2022-05-07 DIAGNOSIS — R5382 Chronic fatigue, unspecified: Secondary | ICD-10-CM | POA: Diagnosis not present

## 2022-05-15 ENCOUNTER — Other Ambulatory Visit: Payer: Self-pay | Admitting: Family Medicine

## 2022-05-15 DIAGNOSIS — Z1231 Encounter for screening mammogram for malignant neoplasm of breast: Secondary | ICD-10-CM

## 2022-05-21 DIAGNOSIS — Z6827 Body mass index (BMI) 27.0-27.9, adult: Secondary | ICD-10-CM | POA: Diagnosis not present

## 2022-05-21 DIAGNOSIS — M2550 Pain in unspecified joint: Secondary | ICD-10-CM | POA: Diagnosis not present

## 2022-06-04 DIAGNOSIS — R5382 Chronic fatigue, unspecified: Secondary | ICD-10-CM | POA: Diagnosis not present

## 2022-06-04 DIAGNOSIS — Z6826 Body mass index (BMI) 26.0-26.9, adult: Secondary | ICD-10-CM | POA: Diagnosis not present

## 2022-06-18 DIAGNOSIS — R5382 Chronic fatigue, unspecified: Secondary | ICD-10-CM | POA: Diagnosis not present

## 2022-06-18 DIAGNOSIS — Z6826 Body mass index (BMI) 26.0-26.9, adult: Secondary | ICD-10-CM | POA: Diagnosis not present

## 2022-07-23 DIAGNOSIS — R5382 Chronic fatigue, unspecified: Secondary | ICD-10-CM | POA: Diagnosis not present

## 2022-07-23 DIAGNOSIS — Z6826 Body mass index (BMI) 26.0-26.9, adult: Secondary | ICD-10-CM | POA: Diagnosis not present

## 2022-07-27 DIAGNOSIS — H02834 Dermatochalasis of left upper eyelid: Secondary | ICD-10-CM | POA: Diagnosis not present

## 2022-07-27 DIAGNOSIS — H57813 Brow ptosis, bilateral: Secondary | ICD-10-CM | POA: Diagnosis not present

## 2022-07-27 DIAGNOSIS — H02831 Dermatochalasis of right upper eyelid: Secondary | ICD-10-CM | POA: Diagnosis not present

## 2022-07-27 HISTORY — PX: EYE SURGERY: SHX253

## 2022-08-13 ENCOUNTER — Ambulatory Visit
Admission: RE | Admit: 2022-08-13 | Discharge: 2022-08-13 | Disposition: A | Discharge status: Home / Self Care | Payer: BC Managed Care – PPO | Source: Ambulatory Visit | Attending: Family Medicine | Admitting: Family Medicine

## 2022-08-13 DIAGNOSIS — Z1231 Encounter for screening mammogram for malignant neoplasm of breast: Secondary | ICD-10-CM | POA: Insufficient documentation

## 2022-08-21 DIAGNOSIS — Z6826 Body mass index (BMI) 26.0-26.9, adult: Secondary | ICD-10-CM | POA: Diagnosis not present

## 2022-08-21 DIAGNOSIS — M2550 Pain in unspecified joint: Secondary | ICD-10-CM | POA: Diagnosis not present

## 2022-10-15 ENCOUNTER — Encounter: Payer: BC Managed Care – PPO | Admitting: Family Medicine

## 2022-10-22 ENCOUNTER — Encounter: Payer: Self-pay | Admitting: Family Medicine

## 2022-10-22 ENCOUNTER — Ambulatory Visit (INDEPENDENT_AMBULATORY_CARE_PROVIDER_SITE_OTHER): Payer: BC Managed Care – PPO | Admitting: Family Medicine

## 2022-10-22 VITALS — BP 129/85 | HR 69 | Temp 98.5°F | Ht 65.5 in | Wt 158.9 lb

## 2022-10-22 DIAGNOSIS — R232 Flushing: Secondary | ICD-10-CM | POA: Diagnosis not present

## 2022-10-22 DIAGNOSIS — Z0000 Encounter for general adult medical examination without abnormal findings: Secondary | ICD-10-CM

## 2022-10-22 DIAGNOSIS — E782 Mixed hyperlipidemia: Secondary | ICD-10-CM | POA: Diagnosis not present

## 2022-10-22 DIAGNOSIS — Z23 Encounter for immunization: Secondary | ICD-10-CM | POA: Diagnosis not present

## 2022-10-22 DIAGNOSIS — C8581 Other specified types of non-Hodgkin lymphoma, lymph nodes of head, face, and neck: Secondary | ICD-10-CM

## 2022-10-22 LAB — URINALYSIS, ROUTINE W REFLEX MICROSCOPIC
Bilirubin, UA: NEGATIVE
Glucose, UA: NEGATIVE
Ketones, UA: NEGATIVE
Leukocytes,UA: NEGATIVE
Nitrite, UA: NEGATIVE
Protein,UA: NEGATIVE
RBC, UA: NEGATIVE
Specific Gravity, UA: 1.02 (ref 1.005–1.030)
Urobilinogen, Ur: 0.2 mg/dL (ref 0.2–1.0)
pH, UA: 7 (ref 5.0–7.5)

## 2022-10-22 MED ORDER — SCOPOLAMINE 1 MG/3DAYS TD PT72: 1.0000 | MEDICATED_PATCH | TRANSDERMAL | 12 refills | Status: DC

## 2022-10-22 MED ORDER — GABAPENTIN 100 MG PO CAPS: 100.0000 mg | ORAL_CAPSULE | Freq: Every day | ORAL | 3 refills | Status: DC

## 2022-10-22 MED ORDER — ESTRADIOL 0.5 MG PO TABS: ORAL_TABLET | ORAL | 3 refills | Status: DC

## 2022-10-22 NOTE — Assessment & Plan Note (Signed)
Rechecking labs today. Await results. Treat as needed.  °

## 2022-10-22 NOTE — Assessment & Plan Note (Signed)
Under good control on current regimen. Continue current regimen. Continue to monitor. Call with any concerns. Refills given.   

## 2022-10-22 NOTE — Assessment & Plan Note (Signed)
Checking labs today. Await results.  

## 2022-10-22 NOTE — Progress Notes (Signed)
BP 129/85   Pulse 69   Temp 98.5 F (36.9 C) (Oral)   Ht 5' 5.5" (1.664 m)   Wt 158 lb 14.4 oz (72.1 kg)   SpO2 96%   BMI 26.04 kg/m    Subjective:    Patient ID: Tracey Weaver, female    DOB: 09/26/1965, 56 y.o.   MRN: [id number]  HPI: Tracey Weaver is a 57 y.o. female presenting on 10/22/2022 for comprehensive medical examination. Current medical complaints include:  HYPERLIPIDEMIA Hyperlipidemia status: excellent compliance Satisfied with current treatment?  yes Side effects:  N/A Past cholesterol meds: none Supplements: none Aspirin:  no The 10-year ASCVD risk score (Arnett DK, et al., 2019) is: 3.4%   Values used to calculate the score:     Age: 55 years     Sex: Female     Is Non-Hispanic African American: No     Diabetic: No     Tobacco smoker: No     Systolic Blood Pressure: [id number] mmHg     Is BP treated: No     HDL Cholesterol: 40 mg/dL     Total Cholesterol: 232 mg/dL Chest pain:  no Coronary artery disease:  no  Menopausal Symptoms: no  Depression Screen done today and results listed below:     10/22/2022   11:01 AM 04/09/2022    8:51 AM 10/09/2021   11:14 AM 10/05/2020    8:12 AM 09/21/2019    2:32 PM  Depression screen PHQ 2/9  Decreased Interest 0 0 1 0 1  Down, Depressed, Hopeless 0 0 0 0 1  PHQ - 2 Score 0 0 1 0 2  Altered sleeping 0 0 1  0  Tired, decreased energy '2 1 2  1  '$ Change in appetite 1 0   0  Feeling bad or failure about yourself  0 0 0  1  Trouble concentrating 0 1 0  2  Moving slowly or fidgety/restless 1 0 0  1  Suicidal thoughts 0 0 0  0  PHQ-9 Score '4 2 4  7  '$ Difficult doing work/chores Not difficult at all Not difficult at all   Not difficult at all    Past Medical History:  Past Medical History:  Diagnosis Date   Adjustment disorder with depressed mood    Anxiety    Cervical cancer ([place])    precancerous cells   Chest pain    Constipation    Degenerative disc disease, cervical    Dysplastic nevus 08/27/2013   s/p resection  by Dr. Aubery Lapping.   Dysthymic disorder    Headache    from pinched nerve in neck   History of ectopic pregnancy    Multiple   Hypertension 05/19/2018   IBS (irritable bowel syndrome)    Constipation; Rx for Amitiza worked well.   Interstitial cystitis    [facility]; s/p bladder biopsy; s/p cystoscopy x 3 in 2015.   Kidney stones    Liver function study, abnormal    Neck stiffness    limited left turn   Pneumonia    PONV (postoperative nausea and vomiting)    Pyelonephritis    Recurrent UTI    Splenomegaly    s/p GI consult and hematology consult   Ulcer 09/28/2011   EGD: +gastric ulcer; rx for Dexilant.  [name].   Vertigo     Surgical History:  Past Surgical History:  Procedure Laterality Date   ABDOMINAL EXPLORATION SURGERY  08/28/1995   ruptured tube from eptopic  pregnancy   CESAREAN SECTION     x 2.   COLONOSCOPY  08/27/2010   [name].     COLONOSCOPY WITH PROPOFOL N/A 11/23/2019   Procedure: COLONOSCOPY WITH PROPOFOL;  Surgeon: Lucilla Lame, MD;  Location: [facility];  Service: Endoscopy;  Laterality: N/A;   CYSTOSTOMY W/ BLADDER BIOPSY  05/27/2014   [name]. Benign.   ESOPHAGOGASTRODUODENOSCOPY  09/28/2011   +gastric ulcer.  [name].   EYE SURGERY Right 07/2022   after dog bite   TONSILLECTOMY  05/27/2014   [place].   TOTAL ABDOMINAL HYSTERECTOMY  08/27/1998   cervical dysplasia;one ovary remaining    Medications:  Current Outpatient Medications on File Prior to Visit  Medication Sig   Ascorbic Acid (VITAMIN C PO) Take by mouth daily.   CASCARA SAGRADA PO Take by mouth.   Cyanocobalamin 2500 MCG SUBL Place under the tongue daily.    diclofenac (VOLTAREN) 75 MG EC tablet Take 1 tablet (75 mg total) by mouth 2 (two) times daily as needed.   Glucosamine 500 MG CAPS Take 1,000 mg by mouth daily.   meclizine (ANTIVERT) 25 MG tablet Take 1 tablet (25 mg total) by mouth 3 (three) times daily as needed for dizziness.   Multiple Vitamin  (MULTIVITAMIN) capsule Take by mouth daily.    RETIN-A MICRO PUMP 0.06 % GEL APPLY 1 APPLICATION ON THE SKIN AT NIGHT/BEDTIME   Turmeric (QC TUMERIC COMPLEX PO) Take 1,000 mg by mouth daily.   VITAMIN D PO Take by mouth daily. Gummies   No current facility-administered medications on file prior to visit.    Allergies:  Allergies  Allergen Reactions   Meloxicam Other (See Comments)    "MAKES ME FEEL FUNNY", Dizziness    Social History:  Social History   Socioeconomic History   Marital status: Married    Spouse name: Not on file   Number of children: 2   Years of education: 14   Highest education level: Not on file  Occupational History   Occupation: Oncologist: self employed  Tobacco Use   Smoking status: Never   Smokeless tobacco: Never  Vaping Use   Vaping Use: Never used  Substance and Sexual Activity   Alcohol use: Yes    Alcohol/week: 0.0 standard drinks of alcohol    Comment: 1-2 drinks per month   Drug use: No   Sexual activity: Yes    Partners: Male    Birth control/protection: Surgical  Other Topics Concern   Not on file  Social History Narrative   Marital status: married x 18 years; happily married; no abuse      Children: two (23, 48); 1 grandson Manufacturing systems engineer)      Lives: husband, son      Employment: hairdresser; 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      Tobacco:  None      Alcohol: 1-2 servings per month.      Drugs:  none      Exercise:  none      Caffeine use: Coffee, 1 serving per day   Always uses seat belts, smoke alarm and carbon monoxide detector in the home, Guns locked up.   Organ Donor: YES   Patient DOES not have living will, DOES not have HCPOA.         Social Determinants of Health   Financial Resource Strain: Not on file  Food Insecurity: Not on file  Transportation Needs: Not on file  Physical Activity: Not on file  Stress:  Not on file  Social Connections: Not on file  Intimate Partner Violence: Not on file   Social History    Tobacco Use  Smoking Status Never  Smokeless Tobacco Never   Social History   Substance and Sexual Activity  Alcohol Use Yes   Alcohol/week: 0.0 standard drinks of alcohol   Comment: 1-2 drinks per month    Family History:  Family History  Problem Relation Age of Onset   Hypertension Mother    COPD Mother    Hyperlipidemia Mother    Alcohol abuse Mother    Colon polyps Mother    Heart attack Mother    Heart disease Mother 3       AMI x 2; stents.  PAD s/p stenting.   Cancer Father        prostate cancer with mets   Diabetes Father    Heart disease Father 64       AMI age 47   Hyperlipidemia Father    Hypertension Father    Mental illness Brother        paranoid schizophrenia   Hypothyroidism Brother    Hyperlipidemia Brother    Cancer Brother 34       lung, germ cell; lobectomy at 65   Drug abuse Brother    Hypertension Brother    Epilepsy Son    Diabetes Son    Stroke Maternal Grandmother    Heart disease Maternal Grandmother    Hypertension Maternal Grandmother    Stroke Maternal Grandfather    Heart disease Maternal Grandfather    Hypertension Maternal Grandfather    Cancer Paternal Grandmother    Heart disease Paternal Grandmother    Diabetes Paternal Grandmother    Breast cancer Paternal Grandmother    Stroke Paternal Grandfather    Heart disease Paternal Grandfather     Past medical history, surgical history, medications, allergies, family history and social history reviewed with patient today and changes made to appropriate areas of the chart.   Review of Systems  Constitutional: Negative.   HENT: Negative.    Eyes: Negative.   Respiratory: Negative.    Cardiovascular: Negative.   Gastrointestinal: Negative.   Genitourinary: Negative.   Musculoskeletal:  Positive for neck pain. Negative for back pain, falls, joint pain and myalgias.  Skin: Negative.    All other ROS negative except what is listed above and in the HPI.      Objective:     BP 129/85   Pulse 69   Temp 98.5 F (36.9 C) (Oral)   Ht 5' 5.5" (1.664 m)   Wt 158 lb 14.4 oz (72.1 kg)   SpO2 96%   BMI 26.04 kg/m   Wt Readings from Last 3 Encounters:  10/22/22 158 lb 14.4 oz (72.1 kg)  04/09/22 166 lb 14.4 oz (75.7 kg)  11/06/21 167 lb (75.8 kg)    Physical Exam Vitals and nursing note reviewed.  Constitutional:      General: She is not in acute distress.    Appearance: Normal appearance. She is normal weight. She is not ill-appearing, toxic-appearing or diaphoretic.  HENT:     Head: Normocephalic and atraumatic.     Right Ear: Tympanic membrane, ear canal and external ear normal. There is no impacted cerumen.     Left Ear: Tympanic membrane, ear canal and external ear normal. There is no impacted cerumen.     Nose: Nose normal. No congestion or rhinorrhea.     Mouth/Throat:     Mouth:  Mucous membranes are moist.     Pharynx: Oropharynx is clear. No oropharyngeal exudate or posterior oropharyngeal erythema.  Eyes:     General: No scleral icterus.       Right eye: No discharge.        Left eye: No discharge.     Extraocular Movements: Extraocular movements intact.     Conjunctiva/sclera: Conjunctivae normal.     Pupils: Pupils are equal, round, and reactive to light.  Neck:     Vascular: No carotid bruit.  Cardiovascular:     Rate and Rhythm: Normal rate and regular rhythm.     Pulses: Normal pulses.     Heart sounds: No murmur heard.    No friction rub. No gallop.  Pulmonary:     Effort: Pulmonary effort is normal. No respiratory distress.     Breath sounds: Normal breath sounds. No stridor. No wheezing, rhonchi or rales.  Chest:     Chest wall: No tenderness.  Abdominal:     General: Abdomen is flat. Bowel sounds are normal. There is no distension.     Palpations: Abdomen is soft. There is no mass.     Tenderness: There is no abdominal tenderness. There is no right CVA tenderness, left CVA tenderness, guarding or rebound.     Hernia: No  hernia is present.  Genitourinary:    Comments: Breast and pelvic exams deferred with shared decision making Musculoskeletal:        General: No swelling, tenderness, deformity or signs of injury.     Cervical back: Normal range of motion and neck supple. No rigidity. No muscular tenderness.     Right lower leg: No edema.     Left lower leg: No edema.  Lymphadenopathy:     Cervical: No cervical adenopathy.  Skin:    General: Skin is warm and dry.     Capillary Refill: Capillary refill takes less than 2 seconds.     Coloration: Skin is not jaundiced or pale.     Findings: No bruising, erythema, lesion or rash.  Neurological:     General: No focal deficit present.     Mental Status: She is alert and oriented to person, place, and time. Mental status is at baseline.     Cranial Nerves: No cranial nerve deficit.     Sensory: No sensory deficit.     Motor: No weakness.     Coordination: Coordination normal.     Gait: Gait normal.     Deep Tendon Reflexes: Reflexes normal.  Psychiatric:        Mood and Affect: Mood normal.        Behavior: Behavior normal.        Thought Content: Thought content normal.        Judgment: Judgment normal.     Results for orders placed or performed in visit on 04/09/22  Comprehensive metabolic panel  Result Value Ref Range   Glucose 91 70 - 99 mg/dL   BUN 14 6 - 24 mg/dL   Creatinine, Ser 0.66 0.57 - 1.00 mg/dL   eGFR 104 >59 mL/min/1.73   BUN/Creatinine Ratio 21 9 - 23   Sodium 141 134 - 144 mmol/L   Potassium 4.1 3.5 - 5.2 mmol/L   Chloride 104 96 - 106 mmol/L   CO2 23 20 - 29 mmol/L   Calcium 9.4 8.7 - 10.2 mg/dL   Total Protein 6.5 6.0 - 8.5 g/dL   Albumin 4.4 3.8 - 4.9 g/dL  Globulin, Total 2.1 1.5 - 4.5 g/dL   Albumin/Globulin Ratio 2.1 1.2 - 2.2   Bilirubin Total 0.4 0.0 - 1.2 mg/dL   Alkaline Phosphatase 84 44 - 121 IU/L   AST 32 0 - 40 IU/L   ALT 81 (H) 0 - 32 IU/L  CBC with Differential/Platelet  Result Value Ref Range   WBC  5.2 3.4 - 10.8 x10E3/uL   RBC 4.59 3.77 - 5.28 x10E6/uL   Hemoglobin 14.1 11.1 - 15.9 g/dL   Hematocrit 43.3 34.0 - 46.6 %   MCV 94 79 - 97 fL   MCH 30.7 26.6 - 33.0 pg   MCHC 32.6 31.5 - 35.7 g/dL   RDW 11.9 11.7 - 15.4 %   Platelets 210 150 - 450 x10E3/uL   Neutrophils 44 Not Estab. %   Lymphs 47 Not Estab. %   Monocytes 6 Not Estab. %   Eos 2 Not Estab. %   Basos 1 Not Estab. %   Neutrophils Absolute 2.3 1.4 - 7.0 x10E3/uL   Lymphocytes Absolute 2.5 0.7 - 3.1 x10E3/uL   Monocytes Absolute 0.3 0.1 - 0.9 x10E3/uL   EOS (ABSOLUTE) 0.1 0.0 - 0.4 x10E3/uL   Basophils Absolute 0.1 0.0 - 0.2 x10E3/uL   Immature Granulocytes 0 Not Estab. %   Immature Grans (Abs) 0.0 0.0 - 0.1 x10E3/uL  Lipid Panel w/o Chol/HDL Ratio  Result Value Ref Range   Cholesterol, Total 232 (H) 100 - 199 mg/dL   Triglycerides 475 (H) 0 - 149 mg/dL   HDL 40 >39 mg/dL   VLDL Cholesterol Cal 82 (H) 5 - 40 mg/dL   LDL Chol Calc (NIH) 110 (H) 0 - 99 mg/dL  Urinalysis, Routine w reflex microscopic  Result Value Ref Range   Specific Gravity, UA 1.025 1.005 - 1.030   pH, UA 7.0 5.0 - 7.5   Color, UA Yellow Yellow   Appearance Ur Clear Clear   Leukocytes,UA Negative Negative   Protein,UA Negative Negative/Trace   Glucose, UA Negative Negative   Ketones, UA Negative Negative   RBC, UA Negative Negative   Bilirubin, UA Negative Negative   Urobilinogen, Ur 0.2 0.2 - 1.0 mg/dL   Nitrite, UA Negative Negative      Assessment & Plan:   Problem List Items Addressed This Visit       Cardiovascular and Mediastinum   Hot flashes    Under good control on current regimen. Continue current regimen. Continue to monitor. Call with any concerns. Refills given.          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 Await results.       Relevant Medications   gabapentin (NEURONTIN) 100 MG capsule   scopolamine (TRANSDERM-SCOP, 1.5 MG,) 1 MG/3DAYS     Other    Mixed hyperlipidemia    Rechecking labs today. Await results. Treat as needed.       Other Visit Diagnoses     Routine general medical examination at a health care facility    -  Primary   Vaccines up to date. Screening labs checked today. Pap, mammo, colonoscopy up to date. Continue diet and exercise. Call with any concerns.   Relevant Orders   CBC with Differential/Platelet   Comprehensive metabolic panel   Lipid Panel w/o Chol/HDL Ratio   Urinalysis, Routine w reflex microscopic   TSH   Need for shingles vaccine       Will come  back for shot next week.   Relevant Orders   Zoster Recombinant (Shingrix )        Follow up plan: Return in about 1 year (around 10/23/2023) for physical.   LABORATORY TESTING:  - Pap smear: up to date  IMMUNIZATIONS:   - Tdap: Tetanus vaccination status reviewed: last tetanus booster within 10 years. - Influenza: Refused - Pneumovax: Not applicable - Prevnar: Not applicable - COVID: Refused - HPV: Not applicable - Shingrix vaccine: will come back next week  SCREENING: -Mammogram: Up to date  - Colonoscopy: Up to date  - Bone Density: Not applicable   PATIENT COUNSELING:   Advised to take 1 mg of folate supplement per day if capable of pregnancy.   Sexuality: Discussed sexually transmitted diseases, partner selection, use of condoms, avoidance of unintended pregnancy  and contraceptive alternatives.   Advised to avoid cigarette smoking.  I discussed with the patient that most people either abstain from alcohol or drink within safe limits (<=14/week and <=4 drinks/occasion for males, <=7/weeks and <= 3 drinks/occasion for females) and that the risk for alcohol disorders and other health effects rises proportionally with the number of drinks per week and how often a drinker exceeds daily limits.  Discussed cessation/primary prevention of drug use and availability of treatment for abuse.   Diet: Encouraged to adjust caloric intake to  maintain  or achieve ideal body weight, to reduce intake of dietary saturated fat and total fat, to limit sodium intake by avoiding high sodium foods and not adding table salt, and to maintain adequate dietary potassium and calcium preferably from fresh fruits, vegetables, and low-fat dairy products.    stressed the importance of regular exercise  Injury prevention: Discussed safety belts, safety helmets, smoke detector, smoking near bedding or upholstery.   Dental health: Discussed importance of regular tooth brushing, flossing, and dental visits.    NEXT PREVENTATIVE PHYSICAL DUE IN 1 YEAR. Return in about 1 year (around 10/23/2023) for physical.

## 2022-10-23 ENCOUNTER — Other Ambulatory Visit: Payer: Self-pay | Admitting: Family Medicine

## 2022-10-23 DIAGNOSIS — M2550 Pain in unspecified joint: Secondary | ICD-10-CM

## 2022-10-23 LAB — CBC WITH DIFFERENTIAL/PLATELET
Basophils Absolute: 0.1 10*3/uL (ref 0.0–0.2)
Basos: 1 %
EOS (ABSOLUTE): 0.1 10*3/uL (ref 0.0–0.4)
Eos: 2 %
Hematocrit: 42.3 % (ref 34.0–46.6)
Hemoglobin: 14.1 g/dL (ref 11.1–15.9)
Immature Grans (Abs): 0 10*3/uL (ref 0.0–0.1)
Immature Granulocytes: 0 %
Lymphocytes Absolute: 2.9 10*3/uL (ref 0.7–3.1)
Lymphs: 48 %
MCH: 31.5 pg (ref 26.6–33.0)
MCHC: 33.3 g/dL (ref 31.5–35.7)
MCV: 95 fL (ref 79–97)
Monocytes Absolute: 0.4 10*3/uL (ref 0.1–0.9)
Monocytes: 6 %
Neutrophils Absolute: 2.5 10*3/uL (ref 1.4–7.0)
Neutrophils: 43 %
Platelets: 232 10*3/uL (ref 150–450)
RBC: 4.47 x10E6/uL (ref 3.77–5.28)
RDW: 11.8 % (ref 11.7–15.4)
WBC: 5.9 10*3/uL (ref 3.4–10.8)

## 2022-10-23 LAB — COMPREHENSIVE METABOLIC PANEL
ALT: 82 IU/L — ABNORMAL HIGH (ref 0–32)
AST: 41 IU/L — ABNORMAL HIGH (ref 0–40)
Albumin/Globulin Ratio: 2.1 (ref 1.2–2.2)
Albumin: 4.5 g/dL (ref 3.8–4.9)
Alkaline Phosphatase: 85 IU/L (ref 44–121)
BUN/Creatinine Ratio: 20 (ref 9–23)
BUN: 13 mg/dL (ref 6–24)
Bilirubin Total: 0.5 mg/dL (ref 0.0–1.2)
CO2: 25 mmol/L (ref 20–29)
Calcium: 9.7 mg/dL (ref 8.7–10.2)
Chloride: 100 mmol/L (ref 96–106)
Creatinine, Ser: 0.65 mg/dL (ref 0.57–1.00)
Globulin, Total: 2.1 g/dL (ref 1.5–4.5)
Glucose: 92 mg/dL (ref 70–99)
Potassium: 4.1 mmol/L (ref 3.5–5.2)
Sodium: 142 mmol/L (ref 134–144)
Total Protein: 6.6 g/dL (ref 6.0–8.5)
eGFR: 103 mL/min/{1.73_m2} (ref >59)

## 2022-10-23 LAB — LIPID PANEL W/O CHOL/HDL RATIO
Cholesterol, Total: 258 mg/dL — ABNORMAL HIGH (ref 100–199)
HDL: 55 mg/dL (ref >39)
LDL Chol Calc (NIH): 147 mg/dL — ABNORMAL HIGH (ref 0–99)
Triglycerides: 305 mg/dL — ABNORMAL HIGH (ref 0–149)
VLDL Cholesterol Cal: 56 mg/dL — ABNORMAL HIGH (ref 5–40)

## 2022-10-23 LAB — TSH: TSH: 1.7 u[IU]/mL (ref 0.450–4.500)

## 2022-10-23 NOTE — Telephone Encounter (Signed)
Requested medication (s) are due for refill today -expired Rx  Requested medication (s) are on the active medication list -yes  Future visit scheduled -no  Last refill: 10/09/21 #[id number]  Notes to clinic: expired Rx  Requested Prescriptions  Pending Prescriptions Disp Refills   diclofenac (VOLTAREN) 75 MG EC tablet [Pharmacy Med Name: DICLOFENAC SOD EC 75 MG TAB] 60 tablet 11    Sig: TAKE 1 TABLET BY MOUTH 2 TIMES DAILY AS NEEDED.     Analgesics:  NSAIDS Failed - 10/23/2022  1:43 AM      Failed - Manual Review: Labs are only required if the patient has taken medication for more than 8 weeks.      Passed - Cr in normal range and within 360 days    Creatinine  Date Value Ref Range Status  12/15/2014 0.71 mg/dL Final    Comment:    0.44-1.00 NOTE: New Reference Range  11/02/14    Creat  Date Value Ref Range Status  11/24/2014 0.66 0.50 - 1.10 mg/dL Final   Creatinine, Ser  Date Value Ref Range Status  10/22/2022 0.65 0.57 - 1.00 mg/dL Final         Passed - HGB in normal range and within 360 days    Hemoglobin  Date Value Ref Range Status  10/22/2022 14.1 11.1 - 15.9 g/dL Final         Passed - PLT in normal range and within 360 days    Platelets  Date Value Ref Range Status  10/22/2022 232 150 - 450 x10E3/uL Final         Passed - HCT in normal range and within 360 days    Hematocrit  Date Value Ref Range Status  10/22/2022 42.3 34.0 - 46.6 % Final         Passed - eGFR is 30 or above and within 360 days    GFR, Est African American  Date Value Ref Range Status  11/30/2013 >89 mL/min Final   EGFR (African American)  Date Value Ref Range Status  12/15/2014 >60  Final   GFR calc Af Amer  Date Value Ref Range Status  10/05/2020 118 >59 mL/min/1.73 Final    Comment:    **In accordance with recommendations from the NKF-ASN Task force,**   [facility] is in the process of updating its eGFR calculation to the   2021 CKD-EPI creatinine equation that estimates  kidney function   without a race variable.    GFR, Est Non African American  Date Value Ref Range Status  11/30/2013 >89 mL/min Final    Comment:      The estimated GFR is a calculation valid for adults (>=87 years old) that uses the CKD-EPI algorithm to adjust for age and sex. It is   not to be used for children, pregnant women, hospitalized patients,    patients on dialysis, or with rapidly changing kidney function. According to the NKDEP, eGFR >89 is normal, 60-89 shows mild impairment, 30-59 shows moderate impairment, 15-29 shows severe impairment and <15 is ESRD.     EGFR (Non-African Amer.)  Date Value Ref Range Status  12/15/2014 >60  Final    Comment:    eGFR values <21m/min/1.73 m2 may be an indication of chronic kidney disease (CKD). Calculated eGFR is useful in patients with stable renal function. The eGFR calculation will not be reliable in acutely ill patients when serum creatinine is changing rapidly. It is not useful in patients on dialysis. The eGFR calculation  may not be applicable to patients at the low and high extremes of body sizes, pregnant women, and vegetarians.    GFR calc non Af Amer  Date Value Ref Range Status  10/05/2020 103 >59 mL/min/1.73 Final   eGFR  Date Value Ref Range Status  10/22/2022 103 >59 mL/min/1.73 Final         Passed - Patient is not pregnant      Passed - Valid encounter within last 12 months    Recent Outpatient Visits           Yesterday Routine general medical examination at a health care facility   [place] P, DO   6 months ago Left flank pain   [place], [place], DO   1 year ago Routine general medical examination at a health care facility   [place], [place], DO   1 year ago Influenza-like illness   [place], [place], DO   1 year ago Dizziness   [place], [place] P, DO                 Requested Prescriptions  Pending Prescriptions Disp Refills   diclofenac (VOLTAREN) 75 MG EC tablet [Pharmacy Med Name: DICLOFENAC SOD EC 75 MG TAB] 60 tablet 11    Sig: TAKE 1 TABLET BY MOUTH 2 TIMES DAILY AS NEEDED.     Analgesics:  NSAIDS Failed - 10/23/2022  1:43 AM      Failed - Manual Review: Labs are only required if the patient has taken medication for more than 8 weeks.      Passed - Cr in normal range and within 360 days    Creatinine  Date Value Ref Range Status  12/15/2014 0.71 mg/dL Final    Comment:    0.44-1.00 NOTE: New Reference Range  11/02/14    Creat  Date Value Ref Range Status  11/24/2014 0.66 0.50 - 1.10 mg/dL Final   Creatinine, Ser  Date Value Ref Range Status  10/22/2022 0.65 0.57 - 1.00 mg/dL Final         Passed - HGB in normal range and within 360 days    Hemoglobin  Date Value Ref Range Status  10/22/2022 14.1 11.1 - 15.9 g/dL Final         Passed - PLT in normal range and within 360 days    Platelets  Date Value Ref Range Status  10/22/2022 232 150 - 450 x10E3/uL Final         Passed - HCT in normal range and within 360 days    Hematocrit  Date Value Ref Range Status  10/22/2022 42.3 34.0 - 46.6 % Final         Passed - eGFR is 30 or above and within 360 days    GFR, Est African American  Date Value Ref Range Status  11/30/2013 >89 mL/min Final   EGFR (African American)  Date Value Ref Range Status  12/15/2014 >60  Final   GFR calc Af Amer  Date Value Ref Range Status  10/05/2020 118 >59 mL/min/1.73 Final    Comment:    **In accordance with recommendations from the NKF-ASN Task force,**   [facility] is in the process of updating its eGFR calculation to the   2021 CKD-EPI creatinine equation that estimates kidney function   without a race variable.  GFR, Est Non African American  Date Value Ref Range Status  11/30/2013 >89 mL/min Final    Comment:       The estimated GFR is a calculation valid for adults (>=37 years old) that uses the CKD-EPI algorithm to adjust for age and sex. It is   not to be used for children, pregnant women, hospitalized patients,    patients on dialysis, or with rapidly changing kidney function. According to the NKDEP, eGFR >89 is normal, 60-89 shows mild impairment, 30-59 shows moderate impairment, 15-29 shows severe impairment and <15 is ESRD.     EGFR (Non-African Amer.)  Date Value Ref Range Status  12/15/2014 >60  Final    Comment:    eGFR values <62m/min/1.73 m2 may be an indication of chronic kidney disease (CKD). Calculated eGFR is useful in patients with stable renal function. The eGFR calculation will not be reliable in acutely ill patients when serum creatinine is changing rapidly. It is not useful in patients on dialysis. The eGFR calculation may not be applicable to patients at the low and high extremes of body sizes, pregnant women, and vegetarians.    GFR calc non Af Amer  Date Value Ref Range Status  10/05/2020 103 >59 mL/min/1.73 Final   eGFR  Date Value Ref Range Status  10/22/2022 103 >59 mL/min/1.73 Final         Passed - Patient is not pregnant      Passed - Valid encounter within last 12 months    Recent Outpatient Visits           Yesterday Routine general medical examination at a health care facility   [place]   6 months ago Left flank pain   [place]   1 year ago Routine general medical examination at a health care facility   [place]   1 year ago Influenza-like illness   [place] DO   1 year ago [place] DO

## 2023-07-09 ENCOUNTER — Other Ambulatory Visit: Payer: Self-pay | Admitting: Family Medicine

## 2023-07-09 DIAGNOSIS — Z1231 Encounter for screening mammogram for malignant neoplasm of breast: Secondary | ICD-10-CM

## 2023-07-29 ENCOUNTER — Ambulatory Visit: Payer: Self-pay | Admitting: *Deleted

## 2023-07-29 DIAGNOSIS — M2550 Pain in unspecified joint: Secondary | ICD-10-CM

## 2023-07-29 MED ORDER — DICLOFENAC SODIUM 75 MG PO TBEC: 75.0000 mg | DELAYED_RELEASE_TABLET | Freq: Two times a day (BID) | ORAL | 0 refills | Status: DC | PRN

## 2023-07-29 NOTE — Telephone Encounter (Signed)
Requested Prescriptions  Pending Prescriptions Disp Refills   diclofenac (VOLTAREN) 75 MG EC tablet 60 tablet 0    Sig: Take 1 tablet (75 mg total) by mouth 2 (two) times daily as needed. TAKE 1 TABLET BY MOUTH 2 TIMES DAILY AS NEEDED.     Analgesics:  NSAIDS Failed - 07/29/2023  1:06 PM      Failed - Manual Review: Labs are only required if the patient has taken medication for more than 8 weeks.      Passed - Cr in normal range and within 360 days    Creatinine  Date Value Ref Range Status  12/15/2014 0.71 mg/dL Final    Comment:    0.16-0.10 NOTE: New Reference Range  11/02/14    Creat  Date Value Ref Range Status  11/24/2014 0.66 0.50 - 1.10 mg/dL Final   Creatinine, Ser  Date Value Ref Range Status  10/22/2022 0.65 0.57 - 1.00 mg/dL Final         Passed - HGB in normal range and within 360 days    Hemoglobin  Date Value Ref Range Status  10/22/2022 14.1 11.1 - 15.9 g/dL Final         Passed - PLT in normal range and within 360 days    Platelets  Date Value Ref Range Status  10/22/2022 232 150 - 450 x10E3/uL Final         Passed - HCT in normal range and within 360 days    Hematocrit  Date Value Ref Range Status  10/22/2022 42.3 34.0 - 46.6 % Final         Passed - eGFR is 30 or above and within 360 days    GFR, Est African American  Date Value Ref Range Status  11/30/2013 >89 mL/min Final   EGFR (African American)  Date Value Ref Range Status  12/15/2014 >60  Final   GFR calc Af Amer  Date Value Ref Range Status  10/05/2020 118 >59 mL/min/1.73 Final    Comment:    **In accordance with recommendations from the NKF-ASN Task force,**   [facility] is in the process of updating its eGFR calculation to the   2021 CKD-EPI creatinine equation that estimates kidney function   without a race variable.    GFR, Est Non African American  Date Value Ref Range Status  11/30/2013 >89 mL/min Final    Comment:      The estimated GFR is a calculation valid for  adults (>=48 years old) that uses the CKD-EPI algorithm to adjust for age and sex. It is   not to be used for children, pregnant women, hospitalized patients,    patients on dialysis, or with rapidly changing kidney function. According to the NKDEP, eGFR >89 is normal, 60-89 shows mild impairment, 30-59 shows moderate impairment, 15-29 shows severe impairment and <15 is ESRD.     EGFR (Non-African Amer.)  Date Value Ref Range Status  12/15/2014 >60  Final    Comment:    eGFR values <27mL/min/1.73 m2 may be an indication of chronic kidney disease (CKD). Calculated eGFR is useful in patients with stable renal function. The eGFR calculation will not be reliable in acutely ill patients when serum creatinine is changing rapidly. It is not useful in patients on dialysis. The eGFR calculation may not be applicable to patients at the low and high extremes of body sizes, pregnant women, and vegetarians.    GFR calc non Af Amer  Date Value Ref Range Status  10/05/2020 103 >59 mL/min/1.73 Final   eGFR  Date Value Ref Range Status  10/22/2022 103 >59 mL/min/1.73 Final         Passed - Patient is not pregnant      Passed - Valid encounter within last 12 months    Recent Outpatient Visits           9 months ago Routine general medical examination at a health care facility   [facility] [place], [place] P, DO   1 year ago Left flank pain   [facility] [place], [place] P, DO   1 year ago Routine general medical examination at a health care facility   [facility] [place], [place], DO   1 year ago Influenza-like illness   [place] [place], [place], DO   2 years ago Dizziness   [place] [facility] [place], Oralia Rud, DO       Future Appointments             In 3 weeks Laural [place], Oralia Rud, DO [facility], [facility]

## 2023-08-02 ENCOUNTER — Ambulatory Visit: Payer: BC Managed Care – PPO | Admitting: Family Medicine

## 2023-08-23 ENCOUNTER — Ambulatory Visit (INDEPENDENT_AMBULATORY_CARE_PROVIDER_SITE_OTHER): Payer: BC Managed Care – PPO | Admitting: Family Medicine

## 2023-08-23 VITALS — BP 134/86 | HR 90 | Temp 98.2°F | Ht 65.5 in | Wt 159.4 lb

## 2023-08-23 DIAGNOSIS — E782 Mixed hyperlipidemia: Secondary | ICD-10-CM | POA: Diagnosis not present

## 2023-08-23 DIAGNOSIS — R748 Abnormal levels of other serum enzymes: Secondary | ICD-10-CM | POA: Diagnosis not present

## 2023-08-23 DIAGNOSIS — M2550 Pain in unspecified joint: Secondary | ICD-10-CM | POA: Diagnosis not present

## 2023-08-23 DIAGNOSIS — Z809 Family history of malignant neoplasm, unspecified: Secondary | ICD-10-CM | POA: Diagnosis not present

## 2023-08-23 MED ORDER — DICLOFENAC SODIUM 75 MG PO TBEC: 75.0000 mg | DELAYED_RELEASE_TABLET | Freq: Two times a day (BID) | ORAL | 1 refills | Status: DC | PRN

## 2023-08-23 NOTE — Progress Notes (Signed)
BP 134/86 (BP Location: Left Arm, Patient Position: Sitting, Cuff Size: Normal)   Pulse 90   Temp 98.2 F (36.8 C) (Oral)   Ht 5' 5.5" (1.664 m)   Wt 159 lb 6.4 oz (72.3 kg)   SpO2 95%   BMI 26.12 kg/m    Subjective:    Patient ID: Tracey Weaver, female    DOB: [birth year], 57 y.o.   MRN: [id number]  HPI: Tracey Weaver is a 57 y.o. female  Chief Complaint  Patient presents with   genetic testing   test    Would like to have liver test for any deficiencies    Brother was diagnosed with large B-cell lymphoma earlier this year. Her other brother also has cancer. She has several family members on her dad's side that also have cancer. She is very anxious about cancer. She was talking to her brother's oncologist and they recommended her do some genetic testing. She also notes that her liver functions have been up the past few years- she'd like to have them rechecked. No other concerns or complaints at this time.   Relevant past medical, surgical, family and social history reviewed and updated as indicated. Interim medical history since our last visit reviewed. Allergies and medications reviewed and updated.  Review of Systems  Constitutional: Negative.   Respiratory: Negative.    Cardiovascular: Negative.   Gastrointestinal: Negative.   Musculoskeletal: Negative.   Neurological: Negative.   Psychiatric/Behavioral: Negative.      Per HPI unless specifically indicated above     Objective:    BP 134/86 (BP Location: Left Arm, Patient Position: Sitting, Cuff Size: Normal)   Pulse 90   Temp 98.2 F (36.8 C) (Oral)   Ht 5' 5.5" (1.664 m)   Wt 159 lb 6.4 oz (72.3 kg)   SpO2 95%   BMI 26.12 kg/m   Wt Readings from Last 3 Encounters:  08/23/23 159 lb 6.4 oz (72.3 kg)  10/22/22 158 lb 14.4 oz (72.1 kg)  04/09/22 166 lb 14.4 oz (75.7 kg)    Physical Exam Vitals and nursing note reviewed.  Constitutional:      General: She is not in acute distress.    Appearance: Normal  appearance. She is normal weight. She is not ill-appearing, toxic-appearing or diaphoretic.  HENT:     Head: Normocephalic and atraumatic.     Right Ear: External ear normal.     Left Ear: External ear normal.     Nose: Nose normal.     Mouth/Throat:     Mouth: Mucous membranes are moist.     Pharynx: Oropharynx is clear.  Eyes:     General: No scleral icterus.       Right eye: No discharge.        Left eye: No discharge.     Extraocular Movements: Extraocular movements intact.     Conjunctiva/sclera: Conjunctivae normal.     Pupils: Pupils are equal, round, and reactive to light.  Cardiovascular:     Rate and Rhythm: Normal rate and regular rhythm.     Pulses: Normal pulses.     Heart sounds: Normal heart sounds. No murmur heard.    No friction rub. No gallop.  Pulmonary:     Effort: Pulmonary effort is normal. No respiratory distress.     Breath sounds: Normal breath sounds. No stridor. No wheezing, rhonchi or rales.  Chest:     Chest wall: No tenderness.  Musculoskeletal:  General: Normal range of motion.     Cervical back: Normal range of motion and neck supple.  Skin:    General: Skin is warm and dry.     Capillary Refill: Capillary refill takes less than 2 seconds.     Coloration: Skin is not jaundiced or pale.     Findings: No bruising, erythema, lesion or rash.  Neurological:     General: No focal deficit present.     Mental Status: She is alert and oriented to person, place, and time. Mental status is at baseline.  Psychiatric:        Mood and Affect: Mood normal.        Behavior: Behavior normal.        Thought Content: Thought content normal.        Judgment: Judgment normal.     Results for orders placed or performed in visit on 10/22/22  Urinalysis, Routine w reflex microscopic   Collection Time: 10/22/22 11:34 AM  Result Value Ref Range   Specific Gravity, UA 1.020 1.005 - 1.030   pH, UA 7.0 5.0 - 7.5   Color, UA Yellow Yellow   Appearance Ur  Clear Clear   Leukocytes,UA Negative Negative   Protein,UA Negative Negative/Trace   Glucose, UA Negative Negative   Ketones, UA Negative Negative   RBC, UA Negative Negative   Bilirubin, UA Negative Negative   Urobilinogen, Ur 0.2 0.2 - 1.0 mg/dL   Nitrite, UA Negative Negative   Microscopic Examination Comment   CBC with Differential/Platelet   Collection Time: 10/22/22 11:36 AM  Result Value Ref Range   WBC 5.9 3.4 - 10.8 x10E3/uL   RBC 4.47 3.77 - 5.28 x10E6/uL   Hemoglobin 14.1 11.1 - 15.9 g/dL   Hematocrit 04.5 40.9 - 46.6 %   MCV 95 79 - 97 fL   MCH 31.5 26.6 - 33.0 pg   MCHC 33.3 31.5 - 35.7 g/dL   RDW 81.1 91.4 - 78.2 %   Platelets 232 150 - 450 x10E3/uL   Neutrophils 43 Not Estab. %   Lymphs 48 Not Estab. %   Monocytes 6 Not Estab. %   Eos 2 Not Estab. %   Basos 1 Not Estab. %   Neutrophils Absolute 2.5 1.4 - 7.0 x10E3/uL   Lymphocytes Absolute 2.9 0.7 - 3.1 x10E3/uL   Monocytes Absolute 0.4 0.1 - 0.9 x10E3/uL   EOS (ABSOLUTE) 0.1 0.0 - 0.4 x10E3/uL   Basophils Absolute 0.1 0.0 - 0.2 x10E3/uL   Immature Granulocytes 0 Not Estab. %   Immature Grans (Abs) 0.0 0.0 - 0.1 x10E3/uL  Comprehensive metabolic panel   Collection Time: 10/22/22 11:36 AM  Result Value Ref Range   Glucose 92 70 - 99 mg/dL   BUN 13 6 - 24 mg/dL   Creatinine, Ser 9.56 0.57 - 1.00 mg/dL   eGFR 213 >08 MV/HQI/6.96   BUN/Creatinine Ratio 20 9 - 23   Sodium 142 134 - 144 mmol/L   Potassium 4.1 3.5 - 5.2 mmol/L   Chloride 100 96 - 106 mmol/L   CO2 25 20 - 29 mmol/L   Calcium 9.7 8.7 - 10.2 mg/dL   Total Protein 6.6 6.0 - 8.5 g/dL   Albumin 4.5 3.8 - 4.9 g/dL   Globulin, Total 2.1 1.5 - 4.5 g/dL   Albumin/Globulin Ratio 2.1 1.2 - 2.2   Bilirubin Total 0.5 0.0 - 1.2 mg/dL   Alkaline Phosphatase 85 44 - 121 IU/L   AST 41 (H) 0 - 40  IU/L   ALT 82 (H) 0 - 32 IU/L  Lipid Panel w/o Chol/HDL Ratio   Collection Time: 10/22/22 11:36 AM  Result Value Ref Range   Cholesterol, Total 258 (H) 100 -  199 mg/dL   Triglycerides 782 (H) 0 - 149 mg/dL   HDL 55 >95 mg/dL   VLDL Cholesterol Cal 56 (H) 5 - 40 mg/dL   LDL Chol Calc (NIH) 621 (H) 0 - 99 mg/dL  TSH   Collection Time: 10/22/22 11:36 AM  Result Value Ref Range   TSH 1.700 0.450 - 4.500 uIU/mL      Assessment & Plan:   Problem List Items Addressed This Visit       Other   Mixed hyperlipidemia   Rechecking labs today. Await results. Treat as needed.       Relevant Orders   Lipid Panel w/o Chol/HDL Ratio   Other Visit Diagnoses       Family history of cancer    -  Primary   Would like to have genetic testing. Referral to genetics placed today. Await their input.   Relevant Orders   Ambulatory referral to Genetics     Elevated liver enzymes       Will recheck labs today. Await results. Treat as needed.   Relevant Orders   CBC with Differential/Platelet   Comprehensive metabolic panel     Multiple joint pain       Doing well with voltaren. Refills given today.   Relevant Medications   diclofenac (VOLTAREN) 75 MG EC tablet        Follow up plan: Return after 10/23/23, for physical.

## 2023-08-23 NOTE — Assessment & Plan Note (Signed)
Rechecking labs today. Await results. Treat as needed.  °

## 2023-08-24 LAB — CBC WITH DIFFERENTIAL/PLATELET
Basophils Absolute: 0 10*3/uL (ref 0.0–0.2)
Basos: 1 %
EOS (ABSOLUTE): 0.1 10*3/uL (ref 0.0–0.4)
Eos: 1 %
Hematocrit: 41.9 % (ref 34.0–46.6)
Hemoglobin: 14.2 g/dL (ref 11.1–15.9)
Immature Grans (Abs): 0 10*3/uL (ref 0.0–0.1)
Immature Granulocytes: 0 %
Lymphocytes Absolute: 2.6 10*3/uL (ref 0.7–3.1)
Lymphs: 41 %
MCH: 31.7 pg (ref 26.6–33.0)
MCHC: 33.9 g/dL (ref 31.5–35.7)
MCV: 94 fL (ref 79–97)
Monocytes Absolute: 0.3 10*3/uL (ref 0.1–0.9)
Monocytes: 5 %
Neutrophils Absolute: 3.4 10*3/uL (ref 1.4–7.0)
Neutrophils: 52 %
Platelets: 242 10*3/uL (ref 150–450)
RBC: 4.48 x10E6/uL (ref 3.77–5.28)
RDW: 11.8 % (ref 11.7–15.4)
WBC: 6.4 10*3/uL (ref 3.4–10.8)

## 2023-08-24 LAB — COMPREHENSIVE METABOLIC PANEL
ALT: 32 [IU]/L (ref 0–32)
AST: 20 [IU]/L (ref 0–40)
Albumin: 4.4 g/dL (ref 3.8–4.9)
Alkaline Phosphatase: 77 [IU]/L (ref 44–121)
BUN/Creatinine Ratio: 31 — ABNORMAL HIGH (ref 9–23)
BUN: 16 mg/dL (ref 6–24)
Bilirubin Total: <0.2 mg/dL (ref 0.0–1.2)
CO2: 25 mmol/L (ref 20–29)
Calcium: 9.6 mg/dL (ref 8.7–10.2)
Chloride: 102 mmol/L (ref 96–106)
Creatinine, Ser: 0.52 mg/dL — ABNORMAL LOW (ref 0.57–1.00)
Globulin, Total: 1.9 g/dL (ref 1.5–4.5)
Glucose: 85 mg/dL (ref 70–99)
Potassium: 3.9 mmol/L (ref 3.5–5.2)
Sodium: 141 mmol/L (ref 134–144)
Total Protein: 6.3 g/dL (ref 6.0–8.5)
eGFR: 108 mL/min/{1.73_m2} (ref >59)

## 2023-08-24 LAB — LIPID PANEL W/O CHOL/HDL RATIO
Cholesterol, Total: 260 mg/dL — ABNORMAL HIGH (ref 100–199)
HDL: 57 mg/dL (ref >39)
LDL Chol Calc (NIH): 122 mg/dL — ABNORMAL HIGH (ref 0–99)
Triglycerides: 459 mg/dL — ABNORMAL HIGH (ref 0–149)
VLDL Cholesterol Cal: 81 mg/dL — ABNORMAL HIGH (ref 5–40)

## 2023-08-26 ENCOUNTER — Ambulatory Visit: Payer: BC Managed Care – PPO | Admitting: Family Medicine

## 2023-08-26 ENCOUNTER — Ambulatory Visit
Admission: RE | Admit: 2023-08-26 | Discharge: 2023-08-26 | Disposition: A | Discharge status: Home / Self Care | Payer: BC Managed Care – PPO | Source: Ambulatory Visit | Attending: Family Medicine | Admitting: Family Medicine

## 2023-08-26 DIAGNOSIS — Z1231 Encounter for screening mammogram for malignant neoplasm of breast: Secondary | ICD-10-CM | POA: Insufficient documentation

## 2023-08-27 ENCOUNTER — Telehealth: Payer: Self-pay | Admitting: Genetic Counselor

## 2023-08-27 NOTE — Telephone Encounter (Signed)
Patient scheduled per scheduling message. Patient is aware of made appointments and will be mailed an appointment reminder.

## 2023-08-30 ENCOUNTER — Encounter: Payer: Self-pay | Admitting: Nurse Practitioner

## 2023-08-30 ENCOUNTER — Ambulatory Visit (INDEPENDENT_AMBULATORY_CARE_PROVIDER_SITE_OTHER): Payer: BC Managed Care – PPO | Admitting: Nurse Practitioner

## 2023-08-30 VITALS — BP 129/86 | HR 75 | Temp 98.0°F | Ht 65.5 in | Wt 160.6 lb

## 2023-08-30 DIAGNOSIS — R051 Acute cough: Secondary | ICD-10-CM

## 2023-08-30 DIAGNOSIS — J019 Acute sinusitis, unspecified: Secondary | ICD-10-CM

## 2023-08-30 DIAGNOSIS — B9689 Other specified bacterial agents as the cause of diseases classified elsewhere: Secondary | ICD-10-CM

## 2023-08-30 NOTE — Progress Notes (Signed)
 BP 129/86 (BP Location: Right Arm, Patient Position: Sitting, Cuff Size: Large)   Pulse 75   Temp 98 F (36.7 C) (Oral)   Ht 5' 5.5 (1.664 m)   Wt 160 lb 9.6 oz (72.8 kg)   SpO2 97%   BMI 26.32 kg/m    Subjective:    Patient ID: Tracey Weaver, female    DOB: Jan 05, 1966, 58 y.o.   MRN: [id number]  HPI: Tracey Weaver is a 58 y.o. female  Chief Complaint  Patient presents with   Cough    Has been coughing up mucus, worse in the mornings   Results    Would like to discuss lab results concerning kidneys    Nasal Congestion   UPPER RESPIRATORY TRACT INFECTION Worst symptom: Symptoms started on Monday Fever: no Cough: yes Shortness of breath: no Wheezing: no Chest pain: no Chest tightness: yes- when she wakes up in the morning and is coughing stuff up Chest congestion: yes Nasal congestion: yes Runny nose: no Post nasal drip: yes Sneezing: yes Sore throat: yes Swollen glands: no Sinus pressure: yes Headache: yes Face pain: yes Toothache: no Ear pain: yes bilateral Ear pressure: yes bilateral Eyes red/itching:no Eye drainage/crusting: no  Vomiting: no Rash: no Fatigue: yes Sick contacts: yes Strep contacts: no  Context: stable Recurrent sinusitis: no Relief with OTC cold/cough medications: yes  Treatments attempted:  Alka seltzer     Relevant past medical, surgical, family and social history reviewed and updated as indicated. Interim medical history since our last visit reviewed. Allergies and medications reviewed and updated.  Review of Systems  Constitutional:  Positive for fatigue. Negative for fever.  HENT:  Positive for congestion, ear pain, postnasal drip, sinus pressure, sinus pain, sneezing and sore throat. Negative for dental problem and rhinorrhea.   Respiratory:  Positive for cough and chest tightness. Negative for shortness of breath and wheezing.   Cardiovascular:  Negative for chest pain.  Gastrointestinal:  Negative for vomiting.  Skin:   Negative for rash.  Neurological:  Positive for headaches.    Per HPI unless specifically indicated above     Objective:    BP 129/86 (BP Location: Right Arm, Patient Position: Sitting, Cuff Size: Large)   Pulse 75   Temp 98 F (36.7 C) (Oral)   Ht 5' 5.5 (1.664 m)   Wt 160 lb 9.6 oz (72.8 kg)   SpO2 97%   BMI 26.32 kg/m   Wt Readings from Last 3 Encounters:  08/30/23 160 lb 9.6 oz (72.8 kg)  08/23/23 159 lb 6.4 oz (72.3 kg)  10/22/22 158 lb 14.4 oz (72.1 kg)    Physical Exam Vitals and nursing note reviewed.  Constitutional:      General: She is not in acute distress.    Appearance: Normal appearance. She is normal weight. She is not ill-appearing, toxic-appearing or diaphoretic.  HENT:     Head: Normocephalic.     Right Ear: External ear normal. No tenderness. A middle ear effusion is present. Tympanic membrane is not erythematous.     Left Ear: External ear normal. No tenderness. A middle ear effusion is present. Tympanic membrane is not erythematous.     Nose: Congestion and rhinorrhea present.     Right Sinus: Frontal sinus tenderness present.     Left Sinus: Frontal sinus tenderness present.     Mouth/Throat:     Mouth: Mucous membranes are moist.     Pharynx: Oropharynx is clear. Posterior oropharyngeal erythema present. No oropharyngeal  exudate.  Eyes:     General:        Right eye: No discharge.        Left eye: No discharge.     Extraocular Movements: Extraocular movements intact.     Conjunctiva/sclera: Conjunctivae normal.     Pupils: Pupils are equal, round, and reactive to light.  Cardiovascular:     Rate and Rhythm: Normal rate and regular rhythm.     Heart sounds: No murmur heard. Pulmonary:     Effort: Pulmonary effort is normal. No respiratory distress.     Breath sounds: Normal breath sounds. No wheezing or rales.  Musculoskeletal:     Cervical back: Normal range of motion and neck supple.  Skin:    General: Skin is warm and dry.      Capillary Refill: Capillary refill takes less than 2 seconds.  Neurological:     General: No focal deficit present.     Mental Status: She is alert and oriented to person, place, and time. Mental status is at baseline.  Psychiatric:        Mood and Affect: Mood normal.        Behavior: Behavior normal.        Thought Content: Thought content normal.        Judgment: Judgment normal.     Results for orders placed or performed in visit on 08/23/23  CBC with Differential/Platelet   Collection Time: 08/23/23  3:41 PM  Result Value Ref Range   WBC 6.4 3.4 - 10.8 x10E3/uL   RBC 4.48 3.77 - 5.28 x10E6/uL   Hemoglobin 14.2 11.1 - 15.9 g/dL   Hematocrit 58.0 65.9 - 46.6 %   MCV 94 79 - 97 fL   MCH 31.7 26.6 - 33.0 pg   MCHC 33.9 31.5 - 35.7 g/dL   RDW 88.1 88.2 - 84.5 %   Platelets 242 150 - 450 x10E3/uL   Neutrophils 52 Not Estab. %   Lymphs 41 Not Estab. %   Monocytes 5 Not Estab. %   Eos 1 Not Estab. %   Basos 1 Not Estab. %   Neutrophils Absolute 3.4 1.4 - 7.0 x10E3/uL   Lymphocytes Absolute 2.6 0.7 - 3.1 x10E3/uL   Monocytes Absolute 0.3 0.1 - 0.9 x10E3/uL   EOS (ABSOLUTE) 0.1 0.0 - 0.4 x10E3/uL   Basophils Absolute 0.0 0.0 - 0.2 x10E3/uL   Immature Granulocytes 0 Not Estab. %   Immature Grans (Abs) 0.0 0.0 - 0.1 x10E3/uL  Comprehensive metabolic panel   Collection Time: 08/23/23  3:41 PM  Result Value Ref Range   Glucose 85 70 - 99 mg/dL   BUN 16 6 - 24 mg/dL   Creatinine, Ser 9.47 (L) 0.57 - 1.00 mg/dL   eGFR 891 >40 fO/fpw/8.26   BUN/Creatinine Ratio 31 (H) 9 - 23   Sodium 141 134 - 144 mmol/L   Potassium 3.9 3.5 - 5.2 mmol/L   Chloride 102 96 - 106 mmol/L   CO2 25 20 - 29 mmol/L   Calcium  9.6 8.7 - 10.2 mg/dL   Total Protein 6.3 6.0 - 8.5 g/dL   Albumin 4.4 3.8 - 4.9 g/dL   Globulin, Total 1.9 1.5 - 4.5 g/dL   Bilirubin Total <9.7 0.0 - 1.2 mg/dL   Alkaline Phosphatase 77 44 - 121 IU/L   AST 20 0 - 40 IU/L   ALT 32 0 - 32 IU/L  Lipid Panel w/o Chol/HDL Ratio    Collection Time: 08/23/23  3:41 PM  Result Value Ref Range   Cholesterol, Total 260 (H) 100 - 199 mg/dL   Triglycerides 540 (H) 0 - 149 mg/dL   HDL 57 >60 mg/dL   VLDL Cholesterol Cal 81 (H) 5 - 40 mg/dL   LDL Chol Calc (NIH) 877 (H) 0 - 99 mg/dL      Assessment & Plan:   Problem List Items Addressed This Visit   None Visit Diagnoses       Acute bacterial sinusitis    -  Primary   Will treat with augmentin .  Complete course of antibiotics.  Recommend mucinex for symptom management. Rest and hydrate. Follow up if not improved.     Acute cough       Relevant Orders   Novel Coronavirus, NAA ([facility])   Veritor Flu A/B Waived   Rapid Strep screen([facility])        Follow up plan: No follow-ups on file.

## 2023-08-31 LAB — NOVEL CORONAVIRUS, NAA: SARS-CoV-2, NAA: NOT DETECTED

## 2023-09-02 MED ORDER — AMOXICILLIN-POT CLAVULANATE 875-125 MG PO TABS: 1.0000 | ORAL_TABLET | Freq: Two times a day (BID) | ORAL | 0 refills | Status: DC

## 2023-09-02 NOTE — Addendum Note (Signed)
 Addended by: Larae Grooms on: 09/02/2023 09:48 AM   Modules accepted: Orders

## 2023-09-03 LAB — VERITOR FLU A/B WAIVED
Influenza A: NEGATIVE
Influenza B: NEGATIVE

## 2023-09-03 LAB — RAPID STREP SCREEN (MED CTR MEBANE ONLY): Strep Gp A Ag, IA W/Reflex: NEGATIVE

## 2023-09-03 LAB — CULTURE, GROUP A STREP

## 2023-10-14 ENCOUNTER — Telehealth: Payer: Self-pay

## 2023-10-14 NOTE — Telephone Encounter (Signed)
 Received surgical clearance form from [facility], surgery has not been scheduled. Please call and schedule the patient an appointment for this. Form will be placed in the incomplete bin until it can be completed.

## 2023-10-16 NOTE — Telephone Encounter (Signed)
 Patient will keep up coming appt for physical and have forms completed then. Patient expressed understanding that a  copay can be applied.

## 2023-10-25 ENCOUNTER — Encounter: Payer: Self-pay | Admitting: Family Medicine

## 2023-10-25 ENCOUNTER — Ambulatory Visit (INDEPENDENT_AMBULATORY_CARE_PROVIDER_SITE_OTHER): Payer: BC Managed Care – PPO | Admitting: Family Medicine

## 2023-10-25 ENCOUNTER — Other Ambulatory Visit: Payer: Self-pay | Admitting: Family Medicine

## 2023-10-25 ENCOUNTER — Other Ambulatory Visit (HOSPITAL_COMMUNITY)
Admission: RE | Admit: 2023-10-25 | Discharge: 2023-10-25 | Disposition: A | Discharge status: Home / Self Care | Source: Ambulatory Visit | Attending: Family Medicine | Admitting: Family Medicine

## 2023-10-25 VITALS — BP 136/79 | HR 79 | Temp 99.1°F | Resp 16 | Wt 158.9 lb

## 2023-10-25 DIAGNOSIS — Z0000 Encounter for general adult medical examination without abnormal findings: Secondary | ICD-10-CM

## 2023-10-25 DIAGNOSIS — Z01818 Encounter for other preprocedural examination: Secondary | ICD-10-CM | POA: Diagnosis not present

## 2023-10-25 DIAGNOSIS — E782 Mixed hyperlipidemia: Secondary | ICD-10-CM | POA: Diagnosis not present

## 2023-10-25 LAB — COAGUCHEK XS/INR WAIVED
INR: 1 (ref 0.9–1.1)
Prothrombin Time: 11.5 s

## 2023-10-25 MED ORDER — GABAPENTIN 100 MG PO CAPS: 100.0000 mg | ORAL_CAPSULE | Freq: Every day | ORAL | 3 refills | Status: AC

## 2023-10-25 MED ORDER — VALACYCLOVIR HCL 1 G PO TABS: 1000.0000 mg | ORAL_TABLET | Freq: Two times a day (BID) | ORAL | 0 refills | Status: AC

## 2023-10-25 MED ORDER — ESTRADIOL 0.5 MG PO TABS
ORAL_TABLET | ORAL | 3 refills | Status: AC
Start: 2023-10-25 — End: ?

## 2023-10-25 NOTE — Progress Notes (Unsigned)
 BP 136/79 (BP Location: Left Arm, Patient Position: Sitting, Cuff Size: Normal)   Pulse 79   Temp 99.1 F (37.3 C) (Oral)   Resp 16   Wt 158 lb 14.4 oz (72.1 kg)   SpO2 99%   BMI 26.04 kg/m    Subjective:    Patient ID: Tracey Weaver, female    DOB: 1965/10/24, 58 y.o.   MRN: [id number]  HPI: Tracey Weaver is a 58 y.o. female presenting on 10/25/2023 for comprehensive medical examination. Current medical complaints include:  Has had surgery in the past. Has never had problems with anesthesia. No N/V. No issues with extubation. She has always been able to go home when she was supposed to. No family history of issues with anesthesia. She has generally been feeling well. She has no SOB. No CP. She is able to walk a couple of miles a day. No other concerns.   HYPERLIPIDEMIA Hyperlipidemia status: stable Satisfied with current treatment?  yes Side effects:  N/A Medication compliance: N/A Past cholesterol meds: none Supplements: none Aspirin:  no The 10-year ASCVD risk score (Arnett DK, et al., 2019) is: 3.4%   Values used to calculate the score:     Age: 27 years     Sex: Female     Is Non-Hispanic African American: No     Diabetic: No     Tobacco smoker: No     Systolic Blood Pressure: 136 mmHg     Is BP treated: No     HDL Cholesterol: 57 mg/dL     Total Cholesterol: 260 mg/dL Chest pain:  no Coronary artery disease:  no  She currently lives with: husband Menopausal Symptoms: no  Depression Screen done today and results listed below:     10/25/2023    8:59 AM 08/23/2023    3:16 PM 10/22/2022   11:01 AM 04/09/2022    8:51 AM 10/09/2021   11:14 AM  Depression screen PHQ 2/9  Decreased Interest 0 0 0 0 1  Down, Depressed, Hopeless 0 0 0 0 0  PHQ - 2 Score 0 0 0 0 1  Altered sleeping 0 0 0 0 1  Tired, decreased energy 0 2 2 1 2   Change in appetite 1 0 1 0   Feeling bad or failure about yourself  0 0 0 0 0  Trouble concentrating 0 0 0 1 0  Moving slowly or  fidgety/restless 0 0 1 0 0  Suicidal thoughts 0 0 0 0 0  PHQ-9 Score 1 2 4 2 4   Difficult doing work/chores   Not difficult at all Not difficult at all     Past Medical History:  Past Medical History:  Diagnosis Date   Adjustment disorder with depressed mood    Anxiety    Cervical cancer (HCC)    precancerous cells   Chest pain    Constipation    Degenerative disc disease, cervical    Dysplastic nevus 08/27/2013   s/p resection by Dr. Ebony Cargo.   Dysthymic disorder    Headache    from pinched nerve in neck   History of ectopic pregnancy    Multiple   Hypertension 05/19/2018   IBS (irritable bowel syndrome)    Constipation; Rx for Amitiza worked well.   Interstitial cystitis    [facility]/Urology; s/p bladder biopsy; s/p cystoscopy x 3 in 2015.   Kidney stones    Liver function study, abnormal    Neck stiffness    limited left turn  Pneumonia    PONV (postoperative nausea and vomiting)    Pyelonephritis    Recurrent UTI    Splenomegaly    s/p GI consult and hematology consult   Ulcer 09/28/2011   EGD: +gastric ulcer; rx for Dexilant.  [name].   Vertigo     Surgical History:  Past Surgical History:  Procedure Laterality Date   ABDOMINAL EXPLORATION SURGERY  08/28/1995   ruptured tube from eptopic pregnancy   CESAREAN SECTION     x 2.   COLONOSCOPY  08/27/2010   [name].     COLONOSCOPY WITH PROPOFOL N/A 11/23/2019   Procedure: COLONOSCOPY WITH PROPOFOL;  Surgeon: Midge Minium, MD;  [facility];  Service: Endoscopy;  Laterality: N/A;   CYSTOSTOMY W/ BLADDER BIOPSY  05/27/2014   Edwyna Shell. Benign.   ESOPHAGOGASTRODUODENOSCOPY  09/28/2011   +gastric ulcer.  [name].   EYE SURGERY Right 07/2022   after dog bite   TONSILLECTOMY  05/27/2014   [facility].   TOTAL ABDOMINAL HYSTERECTOMY  08/27/1998   cervical dysplasia;one ovary remaining    Medications:  Current Outpatient Medications on File Prior to Visit  Medication Sig   CASCARA SAGRADA PO Take  by mouth.   diclofenac (VOLTAREN) 75 MG EC tablet Take 1 tablet (75 mg total) by mouth 2 (two) times daily as needed. TAKE 1 TABLET BY MOUTH 2 TIMES DAILY AS NEEDED.   Glucosamine 500 MG CAPS Take 1,000 mg by mouth daily.   Multiple Vitamin (MULTIVITAMIN) capsule Take by mouth daily.    RETIN-A MICRO PUMP 0.06 % GEL APPLY 1 APPLICATION ON THE SKIN AT NIGHT/BEDTIME   Turmeric (QC TUMERIC COMPLEX PO) Take 1,000 mg by mouth daily.   VITAMIN D PO Take by mouth daily. Gummies   No current facility-administered medications on file prior to visit.    Allergies:  Allergies  Allergen Reactions   Meloxicam Other (See Comments)    "MAKES ME FEEL FUNNY", Dizziness    Social History:  Social History   Socioeconomic History   Marital status: Married    Spouse name: Not on file   Number of children: 2   Years of education: 14   Highest education level: Associate degree: occupational, Scientist, product/process development, or vocational program  Occupational History   Occupation: Presenter, broadcasting: self employed  Tobacco Use   Smoking status: Never   Smokeless tobacco: Never  Vaping Use   Vaping status: Never Used  Substance and Sexual Activity   Alcohol use: Yes    Alcohol/week: 0.0 standard drinks of alcohol    Comment: 1-2 drinks per month   Drug use: No   Sexual activity: Yes    Partners: Male    Birth control/protection: Surgical  Other Topics Concern   Not on file  Social History Narrative   Marital status: married x 18 years; happily married; no abuse      Children: two (23, 16); 1 grandson Engineer, petroleum)      Lives: husband, son      Employment: hairdresser; 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      Tobacco:  None      Alcohol: 1-2 servings per month.      Drugs:  none      Exercise:  none      Caffeine use: Coffee, 1 serving per day   Always uses seat belts, smoke alarm and carbon monoxide detector in the home, Guns locked up.   Organ Donor: YES   Patient DOES not have living will, DOES  not have HCPOA.          Social Drivers of Corporate investment banker Strain: Low Risk  (10/09/2023)   Received from [facility]   Overall Financial Resource Strain (CARDIA)    Difficulty of Paying Living Expenses: Not hard at all  Food Insecurity: No Food Insecurity (10/09/2023)   Received from [facility]   Hunger Vital Sign    Worried About Running Out of Food in the Last Year: Never true    Ran Out of Food in the Last Year: Never true  Transportation Needs: No Transportation Needs (10/09/2023)   Received from [facility] - Transportation    In the past 12 months, has lack of transportation kept you from medical appointments or from getting medications?: No    Lack of Transportation (Non-Medical): No  Physical Activity: Insufficiently Active (08/23/2023)   Exercise Vital Sign    Days of Exercise per Week: 2 days    Minutes of Exercise per Session: 30 min  Stress: No Stress Concern Present (08/23/2023)   [employer] of Occupational Health - Occupational Stress Questionnaire    Feeling of Stress : Only a little  Social Connections: Moderately Integrated (08/23/2023)   Social Connection and Isolation Panel [NHANES]    Frequency of Communication with Friends and Family: More than three times a week    Frequency of Social Gatherings with Friends and Family: Three times a week    Attends Religious Services: 1 to 4 times per year    Active Member of Clubs or Organizations: No    Attends Engineer, structural: Not on file    Marital Status: Married  Catering manager Violence: Not on file   Social History   Tobacco Use  Smoking Status Never  Smokeless Tobacco Never   Social History   Substance and Sexual Activity  Alcohol Use Yes   Alcohol/week: 0.0 standard drinks of alcohol   Comment: 1-2 drinks per month    Family History:  Family History  Problem Relation Age of Onset   Hypertension Mother    COPD Mother     Hyperlipidemia Mother    Alcohol abuse Mother    Colon polyps Mother    Heart attack Mother    Heart disease Mother 58       AMI x 2; stents.  PAD s/p stenting.   Cancer Father        prostate cancer with mets   Diabetes Father    Heart disease Father 40       AMI age 89   Hyperlipidemia Father    Hypertension Father    Mental illness Brother        paranoid schizophrenia   Hypothyroidism Brother    Hyperlipidemia Brother    Lymphoma Brother        Passed from Sepsis   Cancer Brother 40       lung, germ cell; lobectomy at 74   Drug abuse Brother    Hypertension Brother    Epilepsy Son    Diabetes Son    Stroke Maternal Grandmother    Heart disease Maternal Grandmother    Hypertension Maternal Grandmother    Stroke Maternal Grandfather    Heart disease Maternal Grandfather    Hypertension Maternal Grandfather    Cancer Paternal Grandmother    Heart disease Paternal Grandmother    Diabetes Paternal Grandmother    Breast cancer Paternal Grandmother  Stroke Paternal Grandfather    Heart disease Paternal Grandfather     Past medical history, surgical history, medications, allergies, family history and social history reviewed with patient today and changes made to appropriate areas of the chart.   Review of Systems  Constitutional: Negative.   HENT:  Positive for congestion. Negative for ear discharge, ear pain, hearing loss, nosebleeds, sinus pain, sore throat and tinnitus.   Eyes: Negative.   Respiratory: Negative.  Negative for stridor.   Cardiovascular: Negative.   Gastrointestinal: Negative.   Genitourinary: Negative.   Musculoskeletal: Negative.   Skin:  Positive for rash. Negative for itching.  Neurological: Negative.   Endo/Heme/Allergies: Negative.   Psychiatric/Behavioral: Negative.     All other ROS negative except what is listed above and in the HPI.      Objective:    BP 136/79 (BP Location: Left Arm, Patient Position: Sitting, Cuff Size: Normal)    Pulse 79   Temp 99.1 F (37.3 C) (Oral)   Resp 16   Wt 158 lb 14.4 oz (72.1 kg)   SpO2 99%   BMI 26.04 kg/m   Wt Readings from Last 3 Encounters:  10/25/23 158 lb 14.4 oz (72.1 kg)  08/30/23 160 lb 9.6 oz (72.8 kg)  08/23/23 159 lb 6.4 oz (72.3 kg)    Physical Exam Vitals and nursing note reviewed. Exam conducted with a chaperone present.  Constitutional:      General: She is not in acute distress.    Appearance: Normal appearance. She is normal weight. She is not ill-appearing, toxic-appearing or diaphoretic.  HENT:     Head: Normocephalic and atraumatic.     Right Ear: Tympanic membrane, ear canal and external ear normal. There is no impacted cerumen.     Left Ear: Tympanic membrane, ear canal and external ear normal. There is no impacted cerumen.     Nose: Nose normal. No congestion or rhinorrhea.     Mouth/Throat:     Mouth: Mucous membranes are moist.     Pharynx: Oropharynx is clear. No oropharyngeal exudate or posterior oropharyngeal erythema.  Eyes:     General: No scleral icterus.       Right eye: No discharge.        Left eye: No discharge.     Extraocular Movements: Extraocular movements intact.     Conjunctiva/sclera: Conjunctivae normal.     Pupils: Pupils are equal, round, and reactive to light.  Neck:     Vascular: No carotid bruit.  Cardiovascular:     Rate and Rhythm: Normal rate and regular rhythm.     Pulses: Normal pulses.     Heart sounds: No murmur heard.    No friction rub. No gallop.  Pulmonary:     Effort: Pulmonary effort is normal. No respiratory distress.     Breath sounds: Normal breath sounds. No stridor. No wheezing, rhonchi or rales.  Chest:     Chest wall: No tenderness.  Breasts:    Right: Normal.     Left: Normal.  Abdominal:     General: Abdomen is flat. Bowel sounds are normal. There is no distension.     Palpations: Abdomen is soft. There is no mass.     Tenderness: There is no abdominal tenderness. There is no right CVA  tenderness, left CVA tenderness, guarding or rebound.     Hernia: No hernia is present.  Genitourinary:    Labia:        Right: No rash, tenderness, lesion or  injury.        Left: No rash, tenderness, lesion or injury.      Vagina: Normal.     Cervix: Normal.     Uterus: Normal.      Adnexa: Right adnexa normal and left adnexa normal.  Musculoskeletal:        General: No swelling, tenderness, deformity or signs of injury.     Cervical back: Normal range of motion and neck supple. No rigidity. No muscular tenderness.     Right lower leg: No edema.     Left lower leg: No edema.  Lymphadenopathy:     Cervical: No cervical adenopathy.  Skin:    General: Skin is warm and dry.     Capillary Refill: Capillary refill takes less than 2 seconds.     Coloration: Skin is not jaundiced or pale.     Findings: No bruising, erythema, lesion or rash.  Neurological:     General: No focal deficit present.     Mental Status: She is alert and oriented to person, place, and time. Mental status is at baseline.     Cranial Nerves: No cranial nerve deficit.     Sensory: No sensory deficit.     Motor: No weakness.     Coordination: Coordination normal.     Gait: Gait normal.     Deep Tendon Reflexes: Reflexes normal.  Psychiatric:        Mood and Affect: Mood normal.        Behavior: Behavior normal.        Thought Content: Thought content normal.        Judgment: Judgment normal.     Results for orders placed or performed in visit on 10/25/23  CoaguChek XS/INR Waived   Collection Time: 10/25/23  9:35 AM  Result Value Ref Range   INR 1.0 0.9 - 1.1   Prothrombin Time 11.5 sec      Assessment & Plan:   Problem List Items Addressed This Visit       Other   Mixed hyperlipidemia   Rechecking labs today. Await results. Treat as needed.       Other Visit Diagnoses       Routine general medical examination at a health care facility    -  Primary   Vaccines up to date. Screening labs  checked today. Pap done. Mammo and colonoscopy up to date. Continue diet and exercise. Call with any concerns.   Relevant Orders   CBC with Differential/Platelet   Comprehensive metabolic panel   Lipid Panel w/o Chol/HDL Ratio   TSH   Cytology - PAP     Preop exam for internal medicine       EKG normal. Cleared for surgery.   Relevant Orders   CoaguChek XS/INR Waived (Completed)   EKG 12-Lead        Follow up plan: Return in about 1 year (around 10/24/2024) for physical.   LABORATORY TESTING:  - Pap smear: pap done  IMMUNIZATIONS:   - Tdap: Tetanus vaccination status reviewed: last tetanus booster within 10 years. - Influenza: Refused - Pneumovax: Not applicable - Prevnar: will return for it - COVID: Refused - HPV: Not applicable - Shingrix vaccine:  will return for it  SCREENING: -Mammogram: Up to date  - Colonoscopy: Up to date   PATIENT COUNSELING:   Advised to take 1 mg of folate supplement per day if capable of pregnancy.   Sexuality: Discussed sexually transmitted diseases, partner selection, use of  condoms, avoidance of unintended pregnancy  and contraceptive alternatives.   Advised to avoid cigarette smoking.  I discussed with the patient that most people either abstain from alcohol or drink within safe limits (<=14/week and <=4 drinks/occasion for males, <=7/weeks and <= 3 drinks/occasion for females) and that the risk for alcohol disorders and other health effects rises proportionally with the number of drinks per week and how often a drinker exceeds daily limits.  Discussed cessation/primary prevention of drug use and availability of treatment for abuse.   Diet: Encouraged to adjust caloric intake to maintain  or achieve ideal body weight, to reduce intake of dietary saturated fat and total fat, to limit sodium intake by avoiding high sodium foods and not adding table salt, and to maintain adequate dietary potassium and calcium preferably from fresh fruits,  vegetables, and low-fat dairy products.    stressed the importance of regular exercise  Injury prevention: Discussed safety belts, safety helmets, smoke detector, smoking near bedding or upholstery.   Dental health: Discussed importance of regular tooth brushing, flossing, and dental visits.    NEXT PREVENTATIVE PHYSICAL DUE IN 1 YEAR. Return in about 1 year (around 10/24/2024) for physical.

## 2023-10-28 ENCOUNTER — Encounter: Payer: Self-pay | Admitting: Family Medicine

## 2023-10-29 ENCOUNTER — Encounter: Payer: Self-pay | Admitting: Family Medicine

## 2023-10-29 NOTE — Assessment & Plan Note (Signed)
 Rechecking labs today. Await results. Treat as needed.

## 2023-10-31 ENCOUNTER — Encounter: Payer: Self-pay | Admitting: Family Medicine

## 2023-10-31 LAB — CYTOLOGY - PAP
Comment: NEGATIVE
Diagnosis: NEGATIVE
High risk HPV: NEGATIVE

## 2023-11-01 NOTE — Progress Notes (Signed)
 REFERRING PROVIDER: Dorcas Carrow, DO [address] [place],  [place] [address]  PRIMARY PROVIDER:  Dorcas Carrow, DO  PRIMARY REASON FOR VISIT:  Encounter Diagnoses  Name Primary?   Family history of prostate cancer Yes   Family history of breast cancer    Family history of colon cancer    Family history of lung cancer    Family history of lymphoma     HISTORY OF PRESENT ILLNESS:   Ms. Brines, a 58 y.o. female, was seen for a [facility] cancer genetics consultation at the request of Dr. Laural Benes due to a family history of cancer.  Ms. Brymer presents to clinic today to discuss the possibility of a hereditary predisposition to cancer, to discuss genetic testing, and to further clarify her future cancer risks, as well as potential cancer risks for family members.   Ms. Berhow is a 58 y.o. female with no personal history of cancer.  She reports pre-cancerous cells in uterus at age 75.   SCREENING/RISK FACTORS:  Mammogram within the last year: yes; most recent Dec 2024; category C density  Number of breast biopsies:  0 . Colonoscopy: yes;  most recent in 2021--f/u in 10 years . Hysterectomy: yes at age 71 due to pre-cancerous cell  Ovaries intact: one intact.  Other removed at age 66 due to ectopic pregnancy.   Menarche was at age 61.  First live birth at age 8.  Menopausal status: postmenopausal.  OCP use for approximately  1  years.  HRT use: current use; estradiol for 7 years; premarin for 3 years Dermatology screening: yes; annually    Past Medical History:  Diagnosis Date   Adjustment disorder with depressed mood    Anxiety    Cervical cancer (HCC)    precancerous cells   Chest pain    Constipation    Degenerative disc disease, cervical    Dysplastic nevus 08/27/2013   s/p resection by Dr. Ebony Cargo.   Dysthymic disorder    Headache    from pinched nerve in neck   History of ectopic pregnancy    Multiple   Hypertension 05/19/2018   IBS (irritable bowel syndrome)     Constipation; Rx for Amitiza worked well.   Interstitial cystitis    [facility]/Urology; s/p bladder biopsy; s/p cystoscopy x 3 in 2015.   Kidney stones    Liver function study, abnormal    Neck stiffness    limited left turn   Pneumonia    PONV (postoperative nausea and vomiting)    Pyelonephritis    Recurrent UTI    Splenomegaly    s/p GI consult and hematology consult   Ulcer 09/28/2011   EGD: +gastric ulcer; rx for Dexilant.  [name].   Vertigo     Past Surgical History:  Procedure Laterality Date   ABDOMINAL EXPLORATION SURGERY  08/28/1995   ruptured tube from eptopic pregnancy   CESAREAN SECTION     x 2.   COLONOSCOPY  08/27/2010   [name].     COLONOSCOPY WITH PROPOFOL N/A 11/23/2019   Procedure: COLONOSCOPY WITH PROPOFOL;  Surgeon: Midge Minium, MD;  Location: [facility];  Service: Endoscopy;  Laterality: N/A;   CYSTOSTOMY W/ BLADDER BIOPSY  05/27/2014   Edwyna Shell. Benign.   ESOPHAGOGASTRODUODENOSCOPY  09/28/2011   +gastric ulcer.  [name].   EYE SURGERY Right 07/2022   after dog bite   TONSILLECTOMY  05/27/2014   Vaught.   TOTAL ABDOMINAL HYSTERECTOMY  08/27/1998   cervical dysplasia;one ovary remaining  FAMILY HISTORY:  We obtained a detailed, 4-generation family history.  Significant diagnoses are listed below: Family History  Problem Relation Age of Onset   Colon polyps Mother    Prostate cancer Father        mets to unknown site   Lymphoma Brother        Large B cell; passed from Sepsis   Cancer Brother 40       lung, germ cell; lobectomy at 76   Breast cancer Paternal Aunt        dx >50   Cervical cancer Paternal Aunt        dx 18s   Colon cancer Paternal Aunt        dx >50   Cancer Paternal Uncle        unknown type; dx 60s   Breast cancer Paternal Grandmother 3   Cervical cancer Cousin        dx 66s; paternal cousin   Leukemia Cousin        dx late 72s; maternal female cousin     Ms. Cardamone is unaware of previous family history of  genetic testing for hereditary cancer risks. There is no reported Ashkenazi Jewish ancestry. There is no known consanguinity.  GENETIC COUNSELING ASSESSMENT: Ms. Privitera is a 58 y.o. female with a family history of cancer which is somewhat suggestive of a hereditary cancer syndrome and predisposition to cancer given the presence of related cancers in the family (breast, prostate, etc). We, therefore, discussed and recommended the following at today's visit.   DISCUSSION: We discussed that 5 - 10% of cancer is hereditary, with most cases of hereditary hereditary breast and prostate cancer associated with mutations in BRCA1/2.  There are other genes that can be associated with hereditary breast, prostate, or other cancer syndromes.  We discussed that testing is beneficial for several reasons, including knowing about other cancer risks, identifying potential screening and risk-reduction options that may be appropriate, and understanding if other family members could be at an increased risk for cancer and allowing them to undergo genetic testing.  We reviewed the characteristics, features and inheritance patterns of hereditary cancer syndromes. We also discussed genetic testing, including the appropriate family members to test, the process of testing, insurance coverage and turn-around-time for results. We discussed the implications of a negative, positive, and variant of uncertain significant result. We discussed that negative results would be uninformative given that Ms. Mikelson does not have a personal history of cancer. We recommended Ms. Horn pursue genetic testing for a panel that contains genes associated with breast, prostate, colon, hematologic malignancies, and other cancers.   The CancerNext-Expanded gene panel offered by [facility] and includes sequencing, rearrangement, and RNA analysis for the following 76 genes: AIP, ALK, APC, ATM, AXIN2, BAP1, BARD1, BMPR1A, BRCA1, BRCA2, BRIP1, CDC73, CDH1, CDK4,  CDKN1B, CDKN2A, CEBPA, CHEK2, CTNNA1, DDX41, DICER1, ETV6, FH, FLCN, GATA2, LZTR1, MAX, MBD4, MEN1, MET, MLH1, MSH2, MSH3, MSH6, MUTYH, NF1, NF2, NTHL1, PALB2, PHOX2B, PMS2, POT1, PRKAR1A, PTCH1, PTEN, RAD51C, RAD51D, RB1, RET, RUNX1, SDHA, SDHAF2, SDHB, SDHC, SDHD, SMAD4, SMARCA4, SMARCB1, SMARCE1, STK11, SUFU, TMEM127, TP53, TSC1, TSC2, VHL, and WT1 (sequencing and deletion/duplication); EGFR, HOXB13, KIT, MITF, PDGFRA, POLD1, and POLE (sequencing only); EPCAM and GREM1 (deletion/duplication only).   Based on Ms. Giambalvo's family history of metastatic prostate cancer in her father, she meets NCCN medical criteria for genetic testing.  Other relatives are unavailable for genetic testing at this time. Despite that she meets criteria, she may still  have an out of pocket cost. We discussed that if her out of pocket cost for testing is over $100, the laboratory should contact them to discuss self-pay options and/or patient pay assistance programs.   We discussed the Genetic Information Non-Discrimination Act (GINA) of 2008, which helps protect individuals against genetic discrimination based on their genetic test results.  It impacts both health insurance and employment.  With health insurance, it protects against genetic test results being used for increased premiums or policy termination. For employment, it protects against hiring, firing and promoting decisions based on genetic test results.  GINA does not apply to those in the [employer], those who work for companies with less than 15 employees, and new life insurance or long-term disability insurance policies.  Health status due to a cancer diagnosis is not protected under GINA.  PLAN: After considering the risks, benefits, and limitations, Ms. Strickler provided informed consent to pursue genetic testing and the blood sample was sent to [facility] for analysis of the CancerNext-Expanded +RNAinsight Panel. Results should be available within approximately 3  weeks' time, at which point they will be disclosed by telephone to Ms. Lusher, as will any additional recommendations warranted by these results. Ms. Lauricella will receive a summary of her genetic counseling visit and a copy of her results once available. This information will also be available in Epic.   Ms. Helzer questions were answered to her satisfaction today. Our contact information was provided should additional questions or concerns arise. Thank you for the referral and allowing [place] to share in the care of your patient.   Amairany Schumpert M. Rennie Plowman, MS, [facility] Genetic Counselor [email or web address] (P) [phone]    40 minutes were spent on the date of the encounter in service to the patient including preparation, face-to-face consultation, documentation and care coordination.  The patient was seen alone.  Drs. Gunnar Bulla and/or Mosetta Putt were available to discuss this case as needed.   _______________________________________________________________________ For Office Staff:  Number of people involved in session: 1 Was an Intern/ student involved with case: no

## 2023-11-02 LAB — CBC WITH DIFFERENTIAL/PLATELET
Basophils Absolute: 0.1 10*3/uL (ref 0.0–0.2)
Basos: 1 %
EOS (ABSOLUTE): 0.1 10*3/uL (ref 0.0–0.4)
Eos: 1 %
Hematocrit: 43.3 % (ref 34.0–46.6)
Hemoglobin: 14.5 g/dL (ref 11.1–15.9)
Immature Grans (Abs): 0 10*3/uL (ref 0.0–0.1)
Immature Granulocytes: 0 %
Lymphocytes Absolute: 2.3 10*3/uL (ref 0.7–3.1)
Lymphs: 30 %
MCH: 31.2 pg (ref 26.6–33.0)
MCHC: 33.5 g/dL (ref 31.5–35.7)
MCV: 93 fL (ref 79–97)
Monocytes Absolute: 0.3 10*3/uL (ref 0.1–0.9)
Monocytes: 4 %
Neutrophils Absolute: 4.9 10*3/uL (ref 1.4–7.0)
Neutrophils: 64 %
Platelets: 241 10*3/uL (ref 150–450)
RBC: 4.65 x10E6/uL (ref 3.77–5.28)
RDW: 11.7 % (ref 11.7–15.4)
WBC: 7.7 10*3/uL (ref 3.4–10.8)

## 2023-11-02 LAB — TSH: TSH: 1.51 u[IU]/mL (ref 0.450–4.500)

## 2023-11-02 LAB — COMPREHENSIVE METABOLIC PANEL
ALT: 89 IU/L — ABNORMAL HIGH (ref 0–32)
AST: 39 IU/L (ref 0–40)
Albumin: 4.3 g/dL (ref 3.8–4.9)
Alkaline Phosphatase: 104 IU/L (ref 44–121)
BUN/Creatinine Ratio: 21 (ref 9–23)
BUN: 13 mg/dL (ref 6–24)
Bilirubin Total: <0.2 mg/dL (ref 0.0–1.2)
CO2: 25 mmol/L (ref 20–29)
Calcium: 9.6 mg/dL (ref 8.7–10.2)
Chloride: 102 mmol/L (ref 96–106)
Creatinine, Ser: 0.62 mg/dL (ref 0.57–1.00)
Globulin, Total: 2 g/dL (ref 1.5–4.5)
Glucose: 86 mg/dL (ref 70–99)
Potassium: 4.3 mmol/L (ref 3.5–5.2)
Sodium: 139 mmol/L (ref 134–144)
Total Protein: 6.3 g/dL (ref 6.0–8.5)
eGFR: 104 mL/min/{1.73_m2} (ref >59)

## 2023-11-02 LAB — LIPID PANEL W/O CHOL/HDL RATIO
Cholesterol, Total: 258 mg/dL — ABNORMAL HIGH (ref 100–199)
HDL: 70 mg/dL (ref >39)
LDL Chol Calc (NIH): 155 mg/dL — ABNORMAL HIGH (ref 0–99)
Triglycerides: 186 mg/dL — ABNORMAL HIGH (ref 0–149)
VLDL Cholesterol Cal: 33 mg/dL (ref 5–40)

## 2023-11-04 ENCOUNTER — Inpatient Hospital Stay: Payer: BC Managed Care – PPO | Attending: Genetic Counselor | Admitting: Genetic Counselor

## 2023-11-04 ENCOUNTER — Inpatient Hospital Stay: Payer: BC Managed Care – PPO

## 2023-11-04 ENCOUNTER — Encounter: Payer: Self-pay | Admitting: Genetic Counselor

## 2023-11-04 DIAGNOSIS — Z8042 Family history of malignant neoplasm of prostate: Secondary | ICD-10-CM

## 2023-11-04 DIAGNOSIS — Z803 Family history of malignant neoplasm of breast: Secondary | ICD-10-CM

## 2023-11-04 DIAGNOSIS — Z800 Family history of malignant neoplasm of digestive organs: Secondary | ICD-10-CM

## 2023-11-04 DIAGNOSIS — Z1379 Encounter for other screening for genetic and chromosomal anomalies: Secondary | ICD-10-CM

## 2023-11-04 DIAGNOSIS — Z807 Family history of other malignant neoplasms of lymphoid, hematopoietic and related tissues: Secondary | ICD-10-CM

## 2023-11-04 DIAGNOSIS — Z801 Family history of malignant neoplasm of trachea, bronchus and lung: Secondary | ICD-10-CM

## 2023-11-04 LAB — GENETIC SCREENING ORDER

## 2023-11-05 ENCOUNTER — Other Ambulatory Visit: Payer: Self-pay | Admitting: Orthopedic Surgery

## 2023-11-05 ENCOUNTER — Encounter: Payer: Self-pay | Admitting: Family Medicine

## 2023-11-11 ENCOUNTER — Other Ambulatory Visit: Payer: Self-pay

## 2023-11-11 ENCOUNTER — Encounter
Admission: RE | Admit: 2023-11-11 | Discharge: 2023-11-11 | Disposition: A | Discharge status: Home / Self Care | Source: Ambulatory Visit | Attending: Orthopedic Surgery | Admitting: Orthopedic Surgery

## 2023-11-11 VITALS — BP 116/74 | HR 76 | Ht 65.5 in | Wt 163.8 lb

## 2023-11-11 DIAGNOSIS — Z01818 Encounter for other preprocedural examination: Secondary | ICD-10-CM | POA: Diagnosis present

## 2023-11-11 DIAGNOSIS — Z01812 Encounter for preprocedural laboratory examination: Secondary | ICD-10-CM | POA: Diagnosis not present

## 2023-11-11 HISTORY — DX: Hyperlipidemia, unspecified: E78.5

## 2023-11-11 HISTORY — DX: Unilateral primary osteoarthritis, left hip: M16.12

## 2023-11-11 HISTORY — DX: Other sprain of left hip, initial encounter: S73.192A

## 2023-11-11 HISTORY — DX: Gastro-esophageal reflux disease without esophagitis: K21.9

## 2023-11-11 HISTORY — DX: Benign paroxysmal vertigo, unspecified ear: H81.10

## 2023-11-11 HISTORY — DX: Paroxysmal tachycardia, unspecified: I47.9

## 2023-11-11 LAB — URINALYSIS, ROUTINE W REFLEX MICROSCOPIC
Bilirubin Urine: NEGATIVE
Glucose, UA: NEGATIVE mg/dL
Hgb urine dipstick: NEGATIVE
Ketones, ur: NEGATIVE mg/dL
Leukocytes,Ua: NEGATIVE
Nitrite: NEGATIVE
Protein, ur: NEGATIVE mg/dL
Specific Gravity, Urine: 1.019 (ref 1.005–1.030)
pH: 6 (ref 5.0–8.0)

## 2023-11-11 LAB — SURGICAL PCR SCREEN
MRSA, PCR: NEGATIVE
Staphylococcus aureus: NEGATIVE

## 2023-11-11 NOTE — Patient Instructions (Addendum)
 Your procedure is scheduled on:11-21-23 Thursday Report to the Registration Desk on the 1st floor of the Medical Mall.Then proceed to the 2nd floor Surgery Desk To find out your arrival time, please call [phone] between 1PM - 3PM on:11-20-23 Wednesday If your arrival time is 6:00 am, do not arrive before that time as the Medical Mall entrance doors do not open until 6:00 am.  REMEMBER: Instructions that are not followed completely may result in serious medical risk, up to and including death; or upon the discretion of your surgeon and anesthesiologist your surgery may need to be rescheduled.  Do not eat food after midnight the night before surgery.  No gum chewing or hard candies.  You may however, drink CLEAR liquids up to 2 hours before you are scheduled to arrive for your surgery. Do not drink anything within 2 hours of your scheduled arrival time.  Clear liquids include: - water  - apple juice without pulp - gatorade (not RED colors) - black coffee or tea (Do NOT add milk or creamers to the coffee or tea) Do NOT drink anything that is not on this list.  In addition, your doctor has ordered for you to drink the provided:  Ensure Pre-Surgery Clear Carbohydrate Drink  Drinking this carbohydrate drink up to two hours before surgery helps to reduce insulin resistance and improve patient outcomes. Please complete drinking 2 hours before scheduled arrival time.  One week prior to surgery:Last dose will be on 11-13-23  Stop Anti-inflammatories (NSAIDS) such as diclofenac (VOLTAREN), Advil, Aleve, Ibuprofen, Motrin, Naproxen, Naprosyn and Aspirin based products such as Excedrin, Goody's Powder, BC Powder. Stop ANY OVER THE COUNTER supplements until after surgery (Ashwagandha, [place], Vitamin D, Glucosamine, [place], Multivitamin, Turmeric)  You may however, continue to take Tylenol if needed for pain up until the day of surgery.  Continue taking all of your other prescription  medications up until the day of surgery.  Do NOT take any medication the day of surgery  No Alcohol for 24 hours before or after surgery.  No Smoking including e-cigarettes for 24 hours before surgery.  No chewable tobacco products for at least 6 hours before surgery.  No nicotine patches on the day of surgery.  Do not use any "recreational" drugs for at least a week (preferably 2 weeks) before your surgery.  Please be advised that the combination of cocaine and anesthesia may have negative outcomes, up to and including death. If you test positive for cocaine, your surgery will be cancelled.  On the morning of surgery brush your teeth with toothpaste and water, you may rinse your mouth with mouthwash if you wish. Do not swallow any toothpaste or mouthwash.  Use CHG Soap as directed on instruction sheet.  Do not wear jewelry, make-up, hairpins, clips or nail polish.  For welded (permanent) jewelry: bracelets, anklets, waist bands, etc.  Please have this removed prior to surgery.  If it is not removed, there is a chance that hospital personnel will need to cut it off on the day of surgery.  Do not wear lotions, powders, or perfumes.   Do not shave body hair from the neck down 48 hours before surgery.  Contact lenses, hearing aids and dentures may not be worn into surgery.  Do not bring valuables to the hospital. [facility] is not responsible for any missing/lost belongings or valuables.   Notify your doctor if there is any change in your medical condition (cold, fever, infection).  Wear comfortable clothing (specific  to your surgery type) to the hospital.  After surgery, you can help prevent lung complications by doing breathing exercises.  Take deep breaths and cough every 1-2 hours. Your doctor may order a device called an Incentive Spirometer to help you take deep breaths. When coughing or sneezing, hold a pillow firmly against your incision with both hands. This is called  "splinting." Doing this helps protect your incision. It also decreases belly discomfort.  If you are being admitted to the hospital overnight, leave your suitcase in the car. After surgery it may be brought to your room.  In case of increased patient census, it may be necessary for you, the patient, to continue your postoperative care in the Same Day Surgery department.  If you are being discharged the day of surgery, you will not be allowed to drive home. You will need a responsible individual to drive you home and stay with you for 24 hours after surgery.   If you are taking public transportation, you will need to have a responsible individual with you.  Please call the Pre-admissions Testing Dept. at [phone] if you have any questions about these instructions.  Surgery Visitation Policy:  Patients having surgery or a procedure may have two visitors.  Children under the age of 74 must have an adult with them who is not the patient.  Temporary Visitor Restrictions Due to increasing cases of flu, RSV and COVID-19: Children ages 43 and under will not be able to visit patients in [employer] [employer] hospitals under most circumstances.  Inpatient Visitation:    Visiting hours are 7 a.m. to 8 p.m. Up to four visitors are allowed at one time in a patient room. The visitors may rotate out with other people during the day.  One visitor age 58 or older may stay with the patient overnight and must be in the room by 8 p.m.     Pre-operative 5 CHG Bath Instructions   You can play a key role in reducing the risk of infection after surgery. Your skin needs to be as free of germs as possible. You can reduce the number of germs on your skin by washing with CHG (chlorhexidine gluconate) soap before surgery. CHG is an antiseptic soap that kills germs and continues to kill germs even after washing.   DO NOT use if you have an allergy to chlorhexidine/CHG or antibacterial soaps. If your skin becomes  reddened or irritated, stop using the CHG and notify one of our RNs at [phone].   Please shower with the CHG soap starting 4 days before surgery using the following schedule:     Please keep in mind the following:  DO NOT shave, including legs and underarms, starting the day of your first shower.   You may shave your face at any point before/day of surgery.  Place clean sheets on your bed the day you start using CHG soap. Use a clean washcloth (not used since being washed) for each shower. DO NOT sleep with pets once you start using the CHG.   CHG Shower Instructions:  If you choose to wash your hair and private area, wash first with your normal shampoo/soap.  After you use shampoo/soap, rinse your hair and body thoroughly to remove shampoo/soap residue.  Turn the water OFF and apply about 3 tablespoons (45 ml) of CHG soap to a CLEAN washcloth.  Apply CHG soap ONLY FROM YOUR NECK DOWN TO YOUR TOES (washing for 3-5 minutes)  DO NOT use CHG soap  on face, private areas, open wounds, or sores.  Pay special attention to the area where your surgery is being performed.  If you are having back surgery, having someone wash your back for you may be helpful. Wait 2 minutes after CHG soap is applied, then you may rinse off the CHG soap.  Pat dry with a clean towel  Put on clean clothes/pajamas   If you choose to wear lotion, please use ONLY the CHG-compatible lotions on the back of this paper.     Additional instructions for the day of surgery: DO NOT APPLY any lotions, deodorants, cologne, or perfumes.   Put on clean/comfortable clothes.  Brush your teeth.  Ask your nurse before applying any prescription medications to the skin.      CHG Compatible Lotions   Aveeno Moisturizing lotion  Cetaphil Moisturizing Cream  Cetaphil Moisturizing Lotion  Clairol Herbal Essence Moisturizing Lotion, Dry Skin  Clairol Herbal Essence Moisturizing Lotion, Extra Dry Skin  Clairol Herbal Essence  Moisturizing Lotion, Normal Skin  Curel Age Defying Therapeutic Moisturizing Lotion with Alpha Hydroxy  Curel Extreme Care Body Lotion  Curel Soothing Hands Moisturizing Hand Lotion  Curel Therapeutic Moisturizing Cream, Fragrance-Free  Curel Therapeutic Moisturizing Lotion, Fragrance-Free  Curel Therapeutic Moisturizing Lotion, Original Formula  Eucerin Daily Replenishing Lotion  Eucerin Dry Skin Therapy Plus Alpha Hydroxy Crme  Eucerin Dry Skin Therapy Plus Alpha Hydroxy Lotion  Eucerin Original Crme  Eucerin Original Lotion  Eucerin Plus Crme Eucerin Plus Lotion  Eucerin TriLipid Replenishing Lotion  Keri Anti-Bacterial Hand Lotion  Keri Deep Conditioning Original Lotion Dry Skin Formula Softly Scented  Keri Deep Conditioning Original Lotion, Fragrance Free Sensitive Skin Formula  Keri Lotion Fast Absorbing Fragrance Free Sensitive Skin Formula  Keri Lotion Fast Absorbing Softly Scented Dry Skin Formula  Keri Original Lotion  Keri Skin Renewal Lotion Keri Silky Smooth Lotion  Keri Silky Smooth Sensitive Skin Lotion  Nivea Body Creamy Conditioning Oil  Nivea Body Extra Enriched Lotion  Nivea Body Original Lotion  Nivea Body Sheer Moisturizing Lotion Nivea Crme  Nivea Skin Firming Lotion  NutraDerm 30 Skin Lotion  NutraDerm Skin Lotion  NutraDerm Therapeutic Skin Cream  NutraDerm Therapeutic Skin Lotion  ProShield Protective Hand Cream  Provon moisturizing lotion  How to Use an Incentive Spirometer An incentive spirometer is a tool that measures how well you are filling your lungs with each breath. Learning to take long, deep breaths using this tool can help you keep your lungs clear and active. This may help to reverse or lessen your chance of developing breathing (pulmonary) problems, especially infection. You may be asked to use a spirometer: After a surgery. If you have a lung problem or a history of smoking. After a long period of time when you have been unable to move  or be active. If the spirometer includes an indicator to show the highest number that you have reached, your health care provider or respiratory therapist will help you set a goal. Keep a log of your progress as told by your health care provider. What are the risks? Breathing too quickly may cause dizziness or cause you to pass out. Take your time so you do not get dizzy or light-headed. If you are in pain, you may need to take pain medicine before doing incentive spirometry. It is harder to take a deep breath if you are having pain. How to use your incentive spirometer  Sit up on the edge of your bed or on a chair. Hold  the incentive spirometer so that it is in an upright position. Before you use the spirometer, breathe out normally. Place the mouthpiece in your mouth. Make sure your lips are closed tightly around it. Breathe in slowly and as deeply as you can through your mouth, causing the piston or the ball to rise toward the top of the chamber. Hold your breath for 3-5 seconds, or for as long as possible. If the spirometer includes a coach indicator, use this to guide you in breathing. Slow down your breathing if the indicator goes above the marked areas. Remove the mouthpiece from your mouth and breathe out normally. The piston or ball will return to the bottom of the chamber. Rest for a few seconds, then repeat the steps 10 or more times. Take your time and take a few normal breaths between deep breaths so that you do not get dizzy or light-headed. Do this every 1-2 hours when you are awake. If the spirometer includes a goal marker to show the highest number you have reached (best effort), use this as a goal to work toward during each repetition. After each set of 10 deep breaths, cough a few times. This will help to make sure that your lungs are clear. If you have an incision on your chest or abdomen from surgery, place a pillow or a rolled-up towel firmly against the incision when you  cough. This can help to reduce pain while taking deep breaths and coughing. General tips When you are able to get out of bed: Walk around often. Continue to take deep breaths and cough in order to clear your lungs. Keep using the incentive spirometer until your health care provider says it is okay to stop using it. If you have been in the hospital, you may be told to keep using the spirometer at home. Contact a health care provider if: You are having difficulty using the spirometer. You have trouble using the spirometer as often as instructed. Your pain medicine is not giving enough relief for you to use the spirometer as told. You have a fever. Get help right away if: You develop shortness of breath. You develop a cough with bloody mucus from the lungs. You have fluid or blood coming from an incision site after you cough. Summary An incentive spirometer is a tool that can help you learn to take long, deep breaths to keep your lungs clear and active. You may be asked to use a spirometer after a surgery, if you have a lung problem or a history of smoking, or if you have been inactive for a long period of time. Use your incentive spirometer as instructed every 1-2 hours while you are awake. If you have an incision on your chest or abdomen, place a pillow or a rolled-up towel firmly against your incision when you cough. This will help to reduce pain. Get help right away if you have shortness of breath, you cough up bloody mucus, or blood comes from your incision when you cough. This information is not intended to replace advice given to you by your health care provider. Make sure you discuss any questions you have with your health care provider.  Preoperative Educational Videos for Total Hip, Knee and Shoulder Replacements  To better prepare for surgery, please view our videos that explain the physical activity and discharge planning required to have the best surgical recovery at [facility].  [email or web address]  Questions? Call [phone] or email [email or web address]

## 2023-11-20 LAB — TYPE AND SCREEN
ABO/RH(D): O POS
Antibody Screen: NEGATIVE

## 2023-11-20 MED ORDER — CHLORHEXIDINE GLUCONATE 0.12 % MT SOLN
15.0000 mL | Freq: Once | OROMUCOSAL | Status: AC
  Administered 2023-11-21: 15 mL via OROMUCOSAL

## 2023-11-20 MED ORDER — DEXAMETHASONE SODIUM PHOSPHATE 10 MG/ML IJ SOLN
8.0000 mg | Freq: Once | INTRAMUSCULAR | Status: AC
  Administered 2023-11-21: 8 mg via INTRAVENOUS

## 2023-11-20 MED ORDER — LACTATED RINGERS IV SOLN: INTRAVENOUS | Status: DC

## 2023-11-20 MED ORDER — TRANEXAMIC ACID-NACL 1000-0.7 MG/100ML-% IV SOLN
1000.0000 mg | INTRAVENOUS | Status: AC
  Administered 2023-11-21 (×2): 1000 mg via INTRAVENOUS

## 2023-11-20 MED ORDER — ORAL CARE MOUTH RINSE: 15.0000 mL | Freq: Once | OROMUCOSAL | Status: AC

## 2023-11-20 MED ORDER — CEFAZOLIN SODIUM-DEXTROSE 2-4 GM/100ML-% IV SOLN
2.0000 g | INTRAVENOUS | Status: AC
  Administered 2023-11-21: 2 g via INTRAVENOUS

## 2023-11-21 ENCOUNTER — Ambulatory Visit: Payer: Self-pay | Admitting: Urgent Care

## 2023-11-21 ENCOUNTER — Encounter: Payer: Self-pay | Admitting: Genetic Counselor

## 2023-11-21 ENCOUNTER — Ambulatory Visit

## 2023-11-21 ENCOUNTER — Other Ambulatory Visit: Payer: Self-pay

## 2023-11-21 ENCOUNTER — Ambulatory Visit
Admission: RE | Admit: 2023-11-21 | Discharge: 2023-11-22 | Disposition: A | Discharge status: Home Health | Source: Ambulatory Visit | Attending: Orthopedic Surgery | Admitting: Orthopedic Surgery

## 2023-11-21 ENCOUNTER — Ambulatory Visit: Admitting: Certified Registered"

## 2023-11-21 ENCOUNTER — Encounter
Admission: RE | Disposition: A | Discharge status: Home Health | Payer: Self-pay | Source: Ambulatory Visit | Attending: Orthopedic Surgery

## 2023-11-21 ENCOUNTER — Encounter: Payer: Self-pay | Admitting: Orthopedic Surgery

## 2023-11-21 DIAGNOSIS — M1612 Unilateral primary osteoarthritis, left hip: Secondary | ICD-10-CM | POA: Insufficient documentation

## 2023-11-21 DIAGNOSIS — I10 Essential (primary) hypertension: Secondary | ICD-10-CM | POA: Diagnosis not present

## 2023-11-21 DIAGNOSIS — K219 Gastro-esophageal reflux disease without esophagitis: Secondary | ICD-10-CM | POA: Insufficient documentation

## 2023-11-21 DIAGNOSIS — X58XXXA Exposure to other specified factors, initial encounter: Secondary | ICD-10-CM | POA: Insufficient documentation

## 2023-11-21 DIAGNOSIS — M25852 Other specified joint disorders, left hip: Secondary | ICD-10-CM | POA: Insufficient documentation

## 2023-11-21 DIAGNOSIS — Z96642 Presence of left artificial hip joint: Secondary | ICD-10-CM | POA: Diagnosis present

## 2023-11-21 DIAGNOSIS — S73192A Other sprain of left hip, initial encounter: Secondary | ICD-10-CM | POA: Diagnosis present

## 2023-11-21 DIAGNOSIS — Z1379 Encounter for other screening for genetic and chromosomal anomalies: Secondary | ICD-10-CM | POA: Insufficient documentation

## 2023-11-21 HISTORY — PX: TOTAL HIP ARTHROPLASTY: SHX124

## 2023-11-21 LAB — ABO/RH: ABO/RH(D): O POS

## 2023-11-21 SURGERY — ARTHROPLASTY, HIP, TOTAL, ANTERIOR APPROACH
Anesthesia: General | Site: Hip | Laterality: Left

## 2023-11-21 MED ORDER — HYDROCODONE-ACETAMINOPHEN 5-325 MG PO TABS
1.0000 | ORAL_TABLET | ORAL | Status: DC | PRN
  Administered 2023-11-21 – 2023-11-22 (×2): 2 via ORAL
  Filled 2023-11-21 (×2): qty 2

## 2023-11-21 MED ORDER — KETOROLAC TROMETHAMINE 15 MG/ML IJ SOLN
7.5000 mg | Freq: Four times a day (QID) | INTRAMUSCULAR | Status: DC
  Administered 2023-11-21 – 2023-11-22 (×3): 7.5 mg via INTRAVENOUS
  Filled 2023-11-21 (×3): qty 1

## 2023-11-21 MED ORDER — DOCUSATE SODIUM 100 MG PO CAPS
100.0000 mg | ORAL_CAPSULE | Freq: Two times a day (BID) | ORAL | Status: DC
  Administered 2023-11-21 – 2023-11-22 (×2): 100 mg via ORAL
  Filled 2023-11-21 (×2): qty 1

## 2023-11-21 MED ORDER — METOCLOPRAMIDE HCL 5 MG/ML IJ SOLN: 5.0000 mg | Freq: Three times a day (TID) | INTRAMUSCULAR | Status: DC | PRN

## 2023-11-21 MED ORDER — FENTANYL CITRATE (PF) 100 MCG/2ML IJ SOLN
INTRAMUSCULAR | Status: DC | PRN
Start: 2023-11-21 — End: 2023-11-21
  Administered 2023-11-21: 50 ug via INTRAVENOUS
  Administered 2023-11-21 (×2): 25 ug via INTRAVENOUS

## 2023-11-21 MED ORDER — MORPHINE SULFATE (PF) 2 MG/ML IV SOLN: 0.5000 mg | INTRAVENOUS | Status: DC | PRN

## 2023-11-21 MED ORDER — EPHEDRINE SULFATE-NACL 50-0.9 MG/10ML-% IV SOSY
PREFILLED_SYRINGE | INTRAVENOUS | Status: DC | PRN
  Administered 2023-11-21: 10 mg via INTRAVENOUS
  Administered 2023-11-21 (×2): 5 mg via INTRAVENOUS

## 2023-11-21 MED ORDER — TRAMADOL HCL 50 MG PO TABS: 50.0000 mg | ORAL_TABLET | Freq: Four times a day (QID) | ORAL | Status: DC | PRN

## 2023-11-21 MED ORDER — ONDANSETRON HCL 4 MG PO TABS: 4.0000 mg | ORAL_TABLET | Freq: Four times a day (QID) | ORAL | Status: DC | PRN

## 2023-11-21 MED ORDER — OXYCODONE HCL 5 MG/5ML PO SOLN: 5.0000 mg | Freq: Once | ORAL | Status: AC | PRN

## 2023-11-21 MED ORDER — SODIUM CHLORIDE 0.9 % IV SOLN: INTRAVENOUS | Status: DC

## 2023-11-21 MED ORDER — PHENYLEPHRINE HCL-NACL 20-0.9 MG/250ML-% IV SOLN
INTRAVENOUS | Status: DC | PRN
  Administered 2023-11-21: 40 ug/min via INTRAVENOUS

## 2023-11-21 MED ORDER — PHENOL 1.4 % MT LIQD: 1.0000 | OROMUCOSAL | Status: DC | PRN

## 2023-11-21 MED ORDER — GABAPENTIN 100 MG PO CAPS
100.0000 mg | ORAL_CAPSULE | Freq: Every day | ORAL | Status: DC
  Administered 2023-11-21: 100 mg via ORAL
  Filled 2023-11-21: qty 1

## 2023-11-21 MED ORDER — PHENYLEPHRINE 80 MCG/ML (10ML) SYRINGE FOR IV PUSH (FOR BLOOD PRESSURE SUPPORT)
PREFILLED_SYRINGE | INTRAVENOUS | Status: DC | PRN
Start: 2023-11-21 — End: 2023-11-21
  Administered 2023-11-21 (×7): 80 ug via INTRAVENOUS

## 2023-11-21 MED ORDER — 0.9 % SODIUM CHLORIDE (POUR BTL) OPTIME
TOPICAL | Status: DC | PRN
  Administered 2023-11-21: 500 mL

## 2023-11-21 MED ORDER — OXYCODONE HCL 5 MG PO TABS
ORAL_TABLET | ORAL | Status: AC
Start: 2023-11-21 — End: ?
  Filled 2023-11-21: qty 1

## 2023-11-21 MED ORDER — PROPOFOL 1000 MG/100ML IV EMUL
INTRAVENOUS | Status: AC
Start: 2023-11-21 — End: ?
  Filled 2023-11-21: qty 100

## 2023-11-21 MED ORDER — LIDOCAINE HCL (CARDIAC) PF 100 MG/5ML IV SOSY
PREFILLED_SYRINGE | INTRAVENOUS | Status: DC | PRN
  Administered 2023-11-21: 100 mg via INTRAVENOUS

## 2023-11-21 MED ORDER — CEFAZOLIN SODIUM-DEXTROSE 2-4 GM/100ML-% IV SOLN
INTRAVENOUS | Status: AC
  Filled 2023-11-21: qty 100

## 2023-11-21 MED ORDER — TRANEXAMIC ACID-NACL 1000-0.7 MG/100ML-% IV SOLN
INTRAVENOUS | Status: AC
  Filled 2023-11-21: qty 100

## 2023-11-21 MED ORDER — SURGIPHOR WOUND IRRIGATION SYSTEM - OPTIME: TOPICAL | Status: DC | PRN

## 2023-11-21 MED ORDER — MENTHOL 3 MG MT LOZG: 1.0000 | LOZENGE | OROMUCOSAL | Status: DC | PRN

## 2023-11-21 MED ORDER — OXYCODONE HCL 5 MG PO TABS
5.0000 mg | ORAL_TABLET | Freq: Once | ORAL | Status: AC | PRN
  Administered 2023-11-21: 5 mg via ORAL

## 2023-11-21 MED ORDER — DROPERIDOL 2.5 MG/ML IJ SOLN
INTRAMUSCULAR | Status: AC
  Filled 2023-11-21: qty 2

## 2023-11-21 MED ORDER — SODIUM CHLORIDE (PF) 0.9 % IJ SOLN
INTRAMUSCULAR | Status: AC
  Filled 2023-11-21: qty 10

## 2023-11-21 MED ORDER — DROPERIDOL 2.5 MG/ML IJ SOLN
0.6250 mg | Freq: Once | INTRAMUSCULAR | Status: AC
  Administered 2023-11-21: 0.625 mg via INTRAVENOUS

## 2023-11-21 MED ORDER — ONDANSETRON HCL 4 MG/2ML IJ SOLN: 4.0000 mg | Freq: Four times a day (QID) | INTRAMUSCULAR | Status: DC | PRN

## 2023-11-21 MED ORDER — ONDANSETRON HCL 4 MG/2ML IJ SOLN
INTRAMUSCULAR | Status: DC | PRN
  Administered 2023-11-21: 4 mg via INTRAVENOUS

## 2023-11-21 MED ORDER — BUPIVACAINE-EPINEPHRINE (PF) 0.25% -1:200000 IJ SOLN
INTRAMUSCULAR | Status: AC
  Filled 2023-11-21: qty 30

## 2023-11-21 MED ORDER — ACETAMINOPHEN 500 MG PO TABS
1000.0000 mg | ORAL_TABLET | Freq: Three times a day (TID) | ORAL | Status: DC
  Administered 2023-11-21: 1000 mg via ORAL
  Administered 2023-11-22: 500 mg via ORAL
  Administered 2023-11-22: 1000 mg via ORAL
  Filled 2023-11-21 (×3): qty 2

## 2023-11-21 MED ORDER — FENTANYL CITRATE (PF) 100 MCG/2ML IJ SOLN
25.0000 ug | INTRAMUSCULAR | Status: DC | PRN
Start: 2023-11-21 — End: 2023-11-21

## 2023-11-21 MED ORDER — SODIUM CHLORIDE (PF) 0.9 % IJ SOLN
INTRAMUSCULAR | Status: DC | PRN
  Administered 2023-11-21: 50 mL

## 2023-11-21 MED ORDER — FENTANYL CITRATE (PF) 100 MCG/2ML IJ SOLN
INTRAMUSCULAR | Status: AC
  Filled 2023-11-21: qty 2

## 2023-11-21 MED ORDER — GLYCOPYRROLATE 0.2 MG/ML IJ SOLN
INTRAMUSCULAR | Status: DC | PRN
  Administered 2023-11-21: .2 mg via INTRAVENOUS

## 2023-11-21 MED ORDER — CHLORHEXIDINE GLUCONATE 0.12 % MT SOLN
OROMUCOSAL | Status: AC
  Filled 2023-11-21: qty 15

## 2023-11-21 MED ORDER — MIDAZOLAM HCL 5 MG/5ML IJ SOLN
INTRAMUSCULAR | Status: DC | PRN
  Administered 2023-11-21 (×2): 1 mg via INTRAVENOUS

## 2023-11-21 MED ORDER — METOCLOPRAMIDE HCL 10 MG PO TABS: 5.0000 mg | ORAL_TABLET | Freq: Three times a day (TID) | ORAL | Status: DC | PRN

## 2023-11-21 MED ORDER — ACETAMINOPHEN 325 MG PO TABS: 325.0000 mg | ORAL_TABLET | Freq: Four times a day (QID) | ORAL | Status: DC | PRN

## 2023-11-21 MED ORDER — PANTOPRAZOLE SODIUM 40 MG PO TBEC
40.0000 mg | DELAYED_RELEASE_TABLET | Freq: Every day | ORAL | Status: DC
  Administered 2023-11-21 – 2023-11-22 (×2): 40 mg via ORAL
  Filled 2023-11-21 (×2): qty 1

## 2023-11-21 MED ORDER — MIDAZOLAM HCL 2 MG/2ML IJ SOLN
INTRAMUSCULAR | Status: AC
  Filled 2023-11-21: qty 2

## 2023-11-21 MED ORDER — BUPIVACAINE HCL (PF) 0.5 % IJ SOLN
INTRAMUSCULAR | Status: DC | PRN
  Administered 2023-11-21: 2.6 mg

## 2023-11-21 MED ORDER — ENOXAPARIN SODIUM 40 MG/0.4ML IJ SOSY
40.0000 mg | PREFILLED_SYRINGE | INTRAMUSCULAR | Status: DC
  Administered 2023-11-22: 40 mg via SUBCUTANEOUS
  Filled 2023-11-21: qty 0.4

## 2023-11-21 MED ORDER — BUPIVACAINE LIPOSOME 1.3 % IJ SUSP
INTRAMUSCULAR | Status: AC
  Filled 2023-11-21: qty 20

## 2023-11-21 MED ORDER — CEFAZOLIN SODIUM-DEXTROSE 2-4 GM/100ML-% IV SOLN
2.0000 g | Freq: Four times a day (QID) | INTRAVENOUS | Status: AC
  Administered 2023-11-21 (×2): 2 g via INTRAVENOUS
  Filled 2023-11-21 (×2): qty 100

## 2023-11-21 MED ORDER — PROPOFOL 10 MG/ML IV BOLUS
INTRAVENOUS | Status: DC | PRN
  Administered 2023-11-21: 150 mg via INTRAVENOUS

## 2023-11-21 MED ORDER — SODIUM CHLORIDE 0.9 % IR SOLN
Status: DC | PRN
  Administered 2023-11-21: 100 mL

## 2023-11-21 SURGICAL SUPPLY — 63 items
BIT DRILL TRIDENT 4X40 SU (BIT) IMPLANT
BLADE CLIPPER SURG (BLADE) IMPLANT
BLADE SAGITTAL AGGR TOOTH XLG (BLADE) ×1 IMPLANT
BNDG COHESIVE 6X5 TAN ST LF (GAUZE/BANDAGES/DRESSINGS) ×1 IMPLANT
BRUSH SCRUB EZ PLAIN DRY (MISCELLANEOUS) ×1 IMPLANT
CHLORAPREP W/TINT 26 (MISCELLANEOUS) ×1 IMPLANT
DERMABOND ADVANCED .7 DNX12 (GAUZE/BANDAGES/DRESSINGS) ×1 IMPLANT
DRAPE C-ARM XRAY 36X54 (DRAPES) ×1 IMPLANT
DRAPE SHEET LG 3/4 BI-LAMINATE (DRAPES) ×3 IMPLANT
DRAPE TABLE BACK 80X90 (DRAPES) ×1 IMPLANT
DRSG MEPILEX SACRM 8.7X9.8 (GAUZE/BANDAGES/DRESSINGS) ×1 IMPLANT
DRSG OPSITE POSTOP 4X8 (GAUZE/BANDAGES/DRESSINGS) ×1 IMPLANT
ELECT BLADE 4.0 EZ CLEAN MEGAD (MISCELLANEOUS) ×1 IMPLANT
ELECT REM PT RETURN 9FT ADLT (ELECTROSURGICAL) ×1 IMPLANT
ELECTRODE BLDE 4.0 EZ CLN MEGD (MISCELLANEOUS) ×1 IMPLANT
ELECTRODE REM PT RTRN 9FT ADLT (ELECTROSURGICAL) ×1 IMPLANT
GLOVE BIO SURGEON STRL SZ8 (GLOVE) ×1 IMPLANT
GLOVE BIOGEL PI IND STRL 8 (GLOVE) ×1 IMPLANT
GLOVE PI ORTHO PRO STRL 7.5 (GLOVE) ×2 IMPLANT
GLOVE PI ORTHO PRO STRL SZ8 (GLOVE) ×2 IMPLANT
GLOVE SURG SYN 7.5 E (GLOVE) ×1 IMPLANT
GLOVE SURG SYN 7.5 PF PI (GLOVE) ×1 IMPLANT
GOWN SRG XL LVL 3 NONREINFORCE (GOWNS) ×1 IMPLANT
GOWN STRL REUS W/ TWL LRG LVL3 (GOWN DISPOSABLE) ×1 IMPLANT
GOWN STRL REUS W/ TWL XL LVL3 (GOWN DISPOSABLE) ×1 IMPLANT
HEAD CERAMIC FEMORAL 36MM (Head) IMPLANT
HOOD PEEL AWAY T7 (MISCELLANEOUS) ×2 IMPLANT
INSERT 0 DEGREE 36 (Miscellaneous) IMPLANT
IV NS 100ML SINGLE PACK (IV SOLUTION) ×1 IMPLANT
KIT PATIENT CARE HANA TABLE (KITS) ×1 IMPLANT
KIT TURNOVER CYSTO (KITS) ×1 IMPLANT
LIGHT WAVEGUIDE WIDE FLAT (MISCELLANEOUS) ×1 IMPLANT
MANIFOLD NEPTUNE II (INSTRUMENTS) ×1 IMPLANT
MARKER SKIN DUAL TIP RULER LAB (MISCELLANEOUS) ×1 IMPLANT
MAT ABSORB FLUID 56X50 GRAY (MISCELLANEOUS) ×1 IMPLANT
NDL SPNL 20GX3.5 QUINCKE YW (NEEDLE) ×1 IMPLANT
NEEDLE SPNL 20GX3.5 QUINCKE YW (NEEDLE) ×1 IMPLANT
NS IRRIG 500ML POUR BTL (IV SOLUTION) ×1 IMPLANT
PACK HIP COMPR (MISCELLANEOUS) ×1 IMPLANT
PAD ARMBOARD POSITIONER FOAM (MISCELLANEOUS) ×1 IMPLANT
PENCIL SMOKE EVACUATOR (MISCELLANEOUS) ×1 IMPLANT
SCREW HEX LP 6.5X15 (Screw) IMPLANT
SCREW HEX LP 6.5X30 (Screw) IMPLANT
SHELL ACETAB TRIDENT 48 (Shell) IMPLANT
SLEEVE SCD COMPRESS KNEE MED (STOCKING) ×1 IMPLANT
SOLUTION IRRIG SURGIPHOR (IV SOLUTION) ×1 IMPLANT
STEM HIGH OFFSET SZ4 36X103 (Stem) IMPLANT
SURGIFLO W/THROMBIN 8M KIT (HEMOSTASIS) IMPLANT
SUT BONE WAX W31G (SUTURE) ×1 IMPLANT
SUT ETHIBOND 2 V 37 (SUTURE) ×1 IMPLANT
SUT SILK 0 30XBRD TIE 6 (SUTURE) ×1 IMPLANT
SUT STRATA 1 CT-1 DLB (SUTURE) ×1 IMPLANT
SUT STRATAFIX 14 PDO 36 VLT (SUTURE) ×1 IMPLANT
SUT STRATAFIX PDO 1 14 VIOLET (SUTURE) ×1 IMPLANT
SUT VIC AB 0 CT1 36 (SUTURE) ×1 IMPLANT
SUT VIC AB 2-0 CT2 27 (SUTURE) ×1 IMPLANT
SUTURE STRATA SPIR 4-0 18 (SUTURE) ×1 IMPLANT
SYR 20ML LL LF (SYRINGE) ×2 IMPLANT
TAPE MICROFOAM 4IN (TAPE) IMPLANT
TOWEL OR 17X26 4PK STRL BLUE (TOWEL DISPOSABLE) IMPLANT
TRAP FLUID SMOKE EVACUATOR (MISCELLANEOUS) ×1 IMPLANT
WAND WEREWOLF FASTSEAL 6.0 (MISCELLANEOUS) ×1 IMPLANT
WATER STERILE IRR 1000ML POUR (IV SOLUTION) ×1 IMPLANT

## 2023-11-21 NOTE — Anesthesia Procedure Notes (Signed)
 Procedure Name: LMA Insertion Date/Time: 11/21/2023 10:54 AM  Performed by: Lanell Matar, CRNAPre-anesthesia Checklist: Patient identified, Emergency Drugs available, Suction available and Patient being monitored Patient Re-evaluated:Patient Re-evaluated prior to induction Oxygen Delivery Method: Circle System Utilized Preoxygenation: Pre-oxygenation with 100% oxygen Induction Type: IV induction Ventilation: Mask ventilation without difficulty LMA: LMA inserted LMA Size: 4.0 Number of attempts: 1 Airway Equipment and Method: Bite block Placement Confirmation: positive ETCO2 Tube secured with: Tape Dental Injury: Teeth and Oropharynx as per pre-operative assessment

## 2023-11-21 NOTE — Plan of Care (Signed)
  Problem: Nutrition: Goal: Adequate nutrition will be maintained Outcome: Progressing   Problem: Pain Managment: Goal: General experience of comfort will improve and/or be controlled Outcome: Progressing

## 2023-11-21 NOTE — Anesthesia Preprocedure Evaluation (Signed)
 Anesthesia Evaluation  Patient identified by MRN, date of birth, ID band Patient awake    Reviewed: Allergy & Precautions, NPO status , Patient's Chart, lab work & pertinent test results  History of Anesthesia Complications (+) PONV and history of anesthetic complications  Airway Mallampati: I  TM Distance: >3 FB Neck ROM: Full    Dental   Pulmonary    breath sounds clear to auscultation       Cardiovascular hypertension, (-) angina (-) DOE  Rhythm:Regular Rate:Normal   HLD   Neuro/Psych  Headaches PSYCHIATRIC DISORDERS Anxiety Depression       GI/Hepatic PUD,GERD  Controlled,, IBS   Endo/Other    Renal/GU      Musculoskeletal   Abdominal   Peds  Hematology   Anesthesia Other Findings Cervical cancer Splenomegaly Lymphoma  Reproductive/Obstetrics                             Anesthesia Physical Anesthesia Plan  ASA: 2  Anesthesia Plan: General/Spinal   Post-op Pain Management:    Induction:   PONV Risk Score and Plan: 2 and Ondansetron, Dexamethasone, Propofol infusion, TIVA and Midazolam  Airway Management Planned: Natural Airway and Nasal Cannula  Additional Equipment:   Intra-op Plan:   Post-operative Plan:   Informed Consent: I have reviewed the patients History and Physical, chart, labs and discussed the procedure including the risks, benefits and alternatives for the proposed anesthesia with the patient or authorized representative who has indicated his/her understanding and acceptance.     Dental Advisory Given  Plan Discussed with: Anesthesiologist, CRNA and Surgeon  Anesthesia Plan Comments: (Patient reports no bleeding problems and no anticoagulant use.  Plan for spinal with backup GA  Patient consented for risks of anesthesia including but not limited to:  - adverse reactions to medications - damage to eyes, teeth, lips or other oral mucosa - nerve  damage due to positioning  - risk of bleeding, infection and or nerve damage from spinal that could lead to paralysis - risk of headache or failed spinal - damage to teeth, lips or other oral mucosa - sore throat or hoarseness - damage to heart, brain, nerves, lungs, other parts of body or loss of life  Patient voiced understanding and assent.)       Anesthesia Quick Evaluation

## 2023-11-21 NOTE — Transfer of Care (Signed)
 Immediate Anesthesia Transfer of Care Note  Patient: Tracey Weaver  Procedure(s) Performed: ARTHROPLASTY, HIP, TOTAL, ANTERIOR APPROACH (Left: Hip)  Patient Location: PACU  Anesthesia Type:General and Spinal  Level of Consciousness: awake, drowsy, and patient cooperative  Airway & Oxygen Therapy: Patient Spontanous Breathing  Post-op Assessment: Report given to RN and Post -op Vital signs reviewed and stable  Post vital signs: Reviewed and stable  Last Vitals:  Vitals Value Taken Time  BP 114/67 11/21/23 1241  Temp    Pulse 74 11/21/23 1243  Resp 12 11/21/23 1243  SpO2 93 % 11/21/23 1243  Vitals shown include unfiled device data.  Last Pain:  Vitals:   11/21/23 0924  PainSc: 0-No pain         Complications: No notable events documented.

## 2023-11-21 NOTE — H&P (Signed)
 .History of Present Illness: Tracey Weaver is an 58 y.o. female presents for history and physical for left anterior total hip arthroplasty with Dr. Audelia Acton on 11/21/2023. Patient has had increasing pain in the left hip for 2 years. She has been treated for bursitis and gluteal tendinitis with a tear. She has had intra-articular injections of her left hip joint to provide her with 4 months worth relief but recently relief has diminished. She has an MRI showing arthritic changes and cartilage loss throughout the left hip joint with a labral tear. Her pain is located in her groin and she has a catching sensation with flexing her hip. She also has left gluteal pain and left lateral hip pain and some pain in the anterior thigh. Pain is interfering with her quality of life and activities daily living. 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  Patient is a non-smoker, nondiabetic with no history of metal allergy with a BMI of 26.3 and historical A1c of 5.1 a few years ago.  Past Medical History: Past Medical History:  Diagnosis Date  Constipation  GERD (gastroesophageal reflux disease)  H/O colonoscopy  repeat 2016  Irritable bowel syndrome  Nephrolithiasis   Past Surgical History: Past Surgical History:  Procedure Laterality Date  exploratory laparotomy 1997  left ovary and fallopian tube removal  COLONOSCOPY 01/21/2014  Per [facility], repeat in 2016  C-SECTIONS 1991, 1998  HYSTERECTOMY VAGINAL W/REMOVAL TUBES &/OR OVARIES   Past Family History: Family History  Problem Relation Age of Onset  COPD Mother  Ulcers Mother  Arthritis Mother  Psoriasis Mother  Prostate cancer Father  Gout Father  Diabetes Father  Testicular cancer Brother  Colon cancer Paternal Aunt  Breast cancer Paternal Grandmother  Uterine cancer Paternal Aunt  Uterine cancer Paternal Aunt  Rheum arthritis Cousin  Rheum arthritis Maternal Grandmother  Lupus Daughter  Rheum arthritis Daughter    Medications: Current Outpatient Medications  Medication Sig Dispense Refill  diclofenac (VOLTAREN) 75 MG EC tablet Take 75 mg by mouth 2 (two) times daily with meals  estrogens, conjugated, (PREMARIN) 0.625 MG tablet Take 0.625 mg by mouth once daily  gabapentin (NEURONTIN) 100 MG capsule Take 100 mg by mouth 3 (three) times daily  phendimetrazine tartrate 35 mg 1 TABLET 1 HOUR BEFORE A MEAL ORALLY THREE TIMES A DAY 14 DAYS  RETIN-A MICRO PUMP 0.08 % GlwP 1(ONE) APPLICATION(S) TOPICAL EVERY NIGHT AT BEDTIME   No current facility-administered medications for this visit.   Allergies: Allergies  Allergen Reactions  Mobic [Meloxicam] Dizziness  "MAKES ME FEEL FUNNY"    Visit Vitals: Vitals:  11/06/23 0814  BP: 118/82    Review of Systems:  A comprehensive 14 point ROS was performed, reviewed, and the pertinent orthopaedic findings are documented in the HPI.  Physical Exam: Body mass index is 27.12 kg/m. Vitals:  11/06/23 0814  BP: 118/82  General:  Well developed, well nourished, no apparent distress, normal affect, normal gait with no antalgic component.   HEENT: Head normocephalic, atraumatic, PERRL.   Abdomen: Soft, non tender, non distended, Bowel sounds present.  Heart: Examination of the heart reveals regular, rate, and rhythm. There is no murmur noted on ascultation. There is a normal apical pulse.  Lungs: Lungs are clear to auscultation. There is no wheeze, rhonchi, or crackles. There is normal expansion of bilateral chest walls.   Left hip exam  SKIN: intact, no scars over the hip prior abdominoplasty scars noted well appearing with no evidence of  infection SWELLING: none WARMTH: no warmth TENDERNESS: Mild tenderness over the lateral hip, Stinchfield Positive ROM: 0 degrees internal rotation and 40 degrees external rotation and pain with internal rotation and hip flexion localized to the groin which reproduces her pain ,; Hip Flexion 110 STRENGTH:  Limited by pain in the left GAIT: stiff-legged STABILITY: stable to testing CREPITUS: no LEG LENGTH DISCREPANCY: none NEUROLOGICAL EXAM: normal VASCULAR EXAM: normal LUMBAR SPINE: tenderness: no straight leg raising sign: no motor exam: normal  The contralateral hip was examined for comparison and it showed: TENDERNESS: none ROM: normal and full STRENGTH: normal STABILITY: stable to testing  Hip Imaging :  I have reviewed AP pelvis and lateral hip X-rays (2 views) taken today in the office of the left hip which reveal mild degenerative changes with some medial joint space narrowing and inferior joint space narrowing worse on the left than the right there is also some superior acetabular sclerosis and a pincer style shape to her bilateral acetabulum. No fractures or dislocations noted  MRI of the left hip performed 09/02/2023, report reviewed by myself. Patient has some hamstring tendinosis with insertional tearing on the left. There is joint space narrowing with cartilage thinning and a labral tear in the left hip. Radiologist read below partially agree with radiologist.  Narrative & Impression  MRI HIP LEFT WITHOUT CONTRAST  Indication: Hip replacement, bursitis/tendinitis suspected, Hip pain, chronic, impingement suspected, xray done, M16.12 Unilateral primary osteoarthritis, left hip, M70.62 Trochanteric bursitis, left hip.  Comparison(s): None available.  Technique: Noncontrast multiplanar, multiecho imaging of the left hip was performed, including T1-weighted and fluid sensitive sequences.  FINDINGS:  Alignment: No dislocation.  Marrow: No acute fracture, avascular necrosis, or concerning osseous lesion.  Hip joints: Mild symmetric hip degenerative changes without subchondral cystic change or edema.  Visualized SI joints and pubic symphysis: Intact.  Visualized lower lumbar spine: Mild spondylosis.  Tendons: Right proximal hamstring tendinosis. Left proximal  hamstring partial insertional tear. Asymmetric thickening and signal heterogeneity with some minor interstitial tearing at the left gluteus minimus tendon insertion. Additional tendinosis and possible low-grade partial tearing at the leading edge of the lateral portion of the gluteus medius.  Muscles: No muscle asymmetry.  Other soft tissues: Asymmetric small volume peritendinous and peritrochanteric bursal edema.   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   Electronically Reviewed by: [place] Verlare, DO, [facility] Electronically Reviewed on: 09/02/2023 9:49 AM  I have reviewed the images and concur with the above findings.  Electronically Signed by: Roma Schanz, MD, [facility] Electronically Signed on: 09/02/2023 10:02 AM   Assessment:  Encounter Diagnoses  Name Primary?  Tear of left acetabular labrum, initial encounter Yes  Primary osteoarthritis of left hip  Enthesopathy of left hip region  Left hip labral tear Left hip tendon tears  Plan: Takyla is a 58 year old female who presents with severe left hip pain refractory to conservative treatments with a known labral tear mild degenerative changes and partial tendon tears. Pain has been increasing over the last 2 years and interfering with her quality of life and activities of daily living. 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 She has had diminishing results with conservative treatment. Pain is interfering with her quality of life and activities day living. Risks, benefits, complications of a left anterior total hip arthroplasty have been discussed with the patient and patient has agreed exam procedure with Dr. Audelia Acton on 11/21/2023.  The hospitalization and post-operative  care and rehabilitation were also discussed. The use of perioperative antibiotics and DVT prophylaxis were discussed. The risk, benefits and alternatives to a surgical intervention were  discussed at length with the patient. The patient was also advised of risks related to the medical comorbidities and elevated body mass index (BMI). A lengthy discussion took place to review the most common complications including but not limited to: deep vein thrombosis, pulmonary embolus, heart attack, stroke, infection, wound breakdown, heterotopic ossification, dislocation, numbness, leg length in-equality, intraoperative fracture, damage to nerves, tendon,muscles, arteries or other blood vessels, death and other possible complications from anesthesia. The patient was told that we will take steps to minimize these risks by using sterile technique, antibiotics and DVT prophylaxis when appropriate and follow the patient postoperatively in the office setting to monitor progress. The possibility of recurrent pain, no improvement in pain and actual worsening of pain were also discussed with the patient. The risk of dislocation following total hip replacement was discussed and potential precautions to prevent dislocation were reviewed. We did have a specific conversation about her young age and her increased risk of need for revision during her life.

## 2023-11-21 NOTE — Op Note (Signed)
 Patient Name: Tracey Weaver  JWJ:[id number]  Pre-Operative Diagnosis: Left hip osteoarthritis, Left hip labral tear, Left hip impingement   Post-Operative Diagnosis: (same)  Procedure: Left Total Hip Arthroplasty  Components/Implants: Cup: Trident Tritanium Clusterhole 48/D w/ x2 screws    Liner: Neutral X3 poly 36/D  Stem: Insignia #4 high offset  Head:36 +26mm biolox ceramic   Date of Surgery: 11/21/2023  Surgeon: Reinaldo Berber MD  Assistant: Amador Cunas PA (present and scrubbed throughout the case, critical for assistance with exposure, retraction, instrumentation, and closure)   Anesthesiologist: Lorette Ang  Anesthesia: Spinal  EBL: 150cc  IVF:900cc  Complications: None   Brief history: The patient is a 58 year old female with a history of impingement, arthritis, and a labral tear of the left hipwith pain limiting their range of motion and activities of daily living, which has failed multiple attempts at conservative therapy.  The risks and benefits of total hip arthroplasty as definitive surgical treatment were discussed with the patient, who opted to proceed with the operation.  After outpatient medical clearance and optimization was completed the patient was admitted to [facility] for the procedure.  All preoperative films were reviewed and an appropriate surgical plan was made prior to surgery.   Description of procedure: The patient was brought to the operating room where laterality was confirmed by all those present to be the left side.  The patient was administered spinal anesthesia on a stretcher prior to being moved supine on the operating room table. Patient was given an intravenous dose of antibiotics for surgical prophylaxis and TXA.  All bony prominences and extremities were well padded and the patient was securely attached to the table boots, a perineal post was placed and the patient had a safety strap placed.  Surgical site was prepped with alcohol  and chlorhexidine. The surgical site over the hip was and draped in typical sterile fashion with multiple layers of adhesive and nonadhesive drapes.  The incision site was marked out with a sterile marker and care was taken to assess the position of the ASIS and ensure appropriate position for the incision.    A surgical timeout was then called with participation of all staff in the room the patient was then a confirmed again and laterality confirmed.  Incision was made over the anterior lateral aspect of the proximal thigh in line with the TFL.  Appropriate retractors were placed and all bleeding vessels were coagulated within the subcutaneous and fatty layers.  An incision was made in the TFL fascia in the interval was carefully identified.  The lateral ascending branches of the circumflex vessels were identified, cauterized and carefully dissected. The main vessels were then tied with a 0 silk hand tie.  Retractors were placed around the superior lateral and inferior medial aspects of the femoral neck and a capsulotomy was performed exposing the hip joint. The femoral head was found to have a large CAM lesion with bony osteophytes along the anterior femoral neck with an adjacent complex labral tear of the anterior/superior labrum.  Retraction stitches were placed and the capsulotomy to assist with visualization.  Femoral neck cut was then made and the femoral head was extracted after placing the leg in traction.  Bone wax was then applied to the proximal cut surface of the femur and aqua mantis was used to address any bleeding around the femoral neck cut.  Retractors were then placed around the acetabulum to fully visualize the joint space, and the remaining labral tissue was removed and  pulvinar was removed.   The acetabulum was then sequentially reamed up to the appropriate size in order to get good fit and fill for the acetabular component while under fluoroscopic guidance.  Acetabular component was then  placed and malleted into a secure fit while confirming position and abduction angle and anteversion utilizing fluoroscopy.  2 screws were then placed in the acetabular cup to assist in securing the cup in place. The cup was irrigated,  a real neutral liner was placed, impacted, and checked for stability. The femur traction was dropped and sequentially externally rotated while performing a release of the posterior and superomedial tissues off of the proximal femur to allow for mobility, care was taken to preserve the external rotators and piriformis attachments.  The remaining interval between the abductors and the capsule was dissected out and a retractor was placed over the superolateral aspect of the femur over the greater trochanter.  The leg was carefully brought down into extension and adducted to provide visualization of the proximal femur for broaching.  The femur was then sequentially broached up to an appropriate size which provided for good fill and stability to the femoral broach.  A trial neck and head were placed on the femoral broach and the leg was brought up for reduction.  The hip was reduced and manual check of stability was performed.  The hip was found to be stable in flexion internal rotation and extension external rotation.  Leg lengths were confirmed on fluoroscopy.   The hip was then dislocated the trial neck and head were removed.  The leg was then brought down into extension and adduction in the proximal femur was reexposed.  The broach trial was removed and the femur was irrigated with normal saline prior to the real femoral stem being implanted.  After the femoral stem was seated and shown to have good fit and fill the appropriate head was impacted the leg was brought up and reduced.  There was good range of motion with stability in flexion internal rotation and extension external rotation on testing.  Leg lengths were found to be appropriate on fluoroscopic evaluation at this time.   The hip was then irrigated with betdine based surgiphor solution and then saline solution.  The capsulotomy was repaired with Ethibond sutures.  A pericapsular and peritrochanteric cocktail with Exparel and bupivacaine was then injected as well as the subcutaneous tissues. The fascia was closed with a #1 barbed running suture.  The deep tissues were closed with Vicryl sutures the subcutaneous tissues were closed with interrupted Vicryl sutures and a running barbed 4-0 suture.  The skin was then reinforced with Dermabond and a sterile dressing was placed.   The patient was awoken from anesthesia transferred off of the operating room table onto a hospital bed where examination of leg lengths found the leg lengths to be equal with a good distal pulse.  The patient was then transferred to the PACU in stable condition.

## 2023-11-21 NOTE — Plan of Care (Signed)
  Problem: Activity: Goal: Risk for activity intolerance will decrease Outcome: Progressing   Problem: Pain Managment: Goal: General experience of comfort will improve and/or be controlled Outcome: Progressing

## 2023-11-21 NOTE — Interval H&P Note (Signed)
Patient history and physical updated. Consent reviewed including risks, benefits, and alternatives to surgery. Patient agrees with above plan to proceed with left anterior total hip arthroplasty  

## 2023-11-21 NOTE — Anesthesia Procedure Notes (Addendum)
 Spinal  Patient location during procedure: OR Start time: 11/21/2023 10:38 AM End time: 11/21/2023 10:42 AM Staffing Resident/CRNA: Lanell Matar, CRNA Performed by: Lanell Matar, CRNA Authorized by: Stephanie Coup, MD   Spinal Block Patient position: sitting Prep: ChloraPrep Patient monitoring: heart rate, continuous pulse ox and blood pressure Approach: midline Location: L3-4 Injection technique: single-shot Needle Needle type: Pencil-Tip  Needle gauge: 24 G Needle length: 10 cm Needle insertion depth: 9 cm Assessment Events: CSF return Additional Notes Positive clear CSF return, positive swirl prior to injection. After injection, CSF easily withdrawn again.

## 2023-11-22 ENCOUNTER — Encounter: Payer: Self-pay | Admitting: Orthopedic Surgery

## 2023-11-22 DIAGNOSIS — M1612 Unilateral primary osteoarthritis, left hip: Secondary | ICD-10-CM | POA: Diagnosis not present

## 2023-11-22 LAB — CBC
HCT: 31.1 % — ABNORMAL LOW (ref 36.0–46.0)
Hemoglobin: 10.6 g/dL — ABNORMAL LOW (ref 12.0–15.0)
MCH: 31.7 pg (ref 26.0–34.0)
MCHC: 34.1 g/dL (ref 30.0–36.0)
MCV: 93.1 fL (ref 80.0–100.0)
Platelets: 177 10*3/uL (ref 150–400)
RBC: 3.34 MIL/uL — ABNORMAL LOW (ref 3.87–5.11)
RDW: 12.6 % (ref 11.5–15.5)
WBC: 10.6 10*3/uL — ABNORMAL HIGH (ref 4.0–10.5)
nRBC: 0 % (ref 0.0–0.2)

## 2023-11-22 LAB — BASIC METABOLIC PANEL WITH GFR
Anion gap: 6 (ref 5–15)
BUN: 13 mg/dL (ref 6–20)
CO2: 26 mmol/L (ref 22–32)
Calcium: 8.5 mg/dL — ABNORMAL LOW (ref 8.9–10.3)
Chloride: 108 mmol/L (ref 98–111)
Creatinine, Ser: 0.55 mg/dL (ref 0.44–1.00)
GFR, Estimated: >60 mL/min (ref >60)
Glucose, Bld: 114 mg/dL — ABNORMAL HIGH (ref 70–99)
Potassium: 4.3 mmol/L (ref 3.5–5.1)
Sodium: 140 mmol/L (ref 135–145)

## 2023-11-22 MED ORDER — OXYCODONE HCL 5 MG PO TABS
5.0000 mg | ORAL_TABLET | Freq: Four times a day (QID) | ORAL | Status: DC | PRN
  Administered 2023-11-22: 5 mg via ORAL
  Filled 2023-11-22: qty 1

## 2023-11-22 MED ORDER — TRAMADOL HCL 50 MG PO TABS: 50.0000 mg | ORAL_TABLET | Freq: Four times a day (QID) | ORAL | 0 refills | Status: DC | PRN

## 2023-11-22 MED ORDER — ENOXAPARIN SODIUM 40 MG/0.4ML IJ SOSY: 40.0000 mg | PREFILLED_SYRINGE | INTRAMUSCULAR | 0 refills | Status: DC

## 2023-11-22 MED ORDER — DOCUSATE SODIUM 100 MG PO CAPS: 100.0000 mg | ORAL_CAPSULE | Freq: Two times a day (BID) | ORAL | 0 refills | Status: DC

## 2023-11-22 MED ORDER — OXYCODONE HCL 5 MG PO TABS: 5.0000 mg | ORAL_TABLET | Freq: Four times a day (QID) | ORAL | 0 refills | Status: DC | PRN

## 2023-11-22 MED ORDER — CELECOXIB 200 MG PO CAPS: 200.0000 mg | ORAL_CAPSULE | Freq: Two times a day (BID) | ORAL | 0 refills | Status: AC

## 2023-11-22 MED ORDER — SODIUM CHLORIDE 0.9 % IV BOLUS
500.0000 mL | Freq: Once | INTRAVENOUS | Status: AC
  Administered 2023-11-22: 500 mL via INTRAVENOUS

## 2023-11-22 MED ORDER — ACETAMINOPHEN 500 MG PO TABS: 1000.0000 mg | ORAL_TABLET | Freq: Three times a day (TID) | ORAL | 0 refills | Status: AC

## 2023-11-22 MED ORDER — ONDANSETRON HCL 4 MG PO TABS: 4.0000 mg | ORAL_TABLET | Freq: Four times a day (QID) | ORAL | 0 refills | Status: DC | PRN

## 2023-11-22 NOTE — Progress Notes (Signed)
 Patient meets all requirements to safely discharge home, husband present at bedside for discharge instructions. Lovenox sharps box provided, and instructions for administration and disposal. All questions met to satisfaction.

## 2023-11-22 NOTE — Discharge Summary (Signed)
 Physician Discharge Summary  Patient ID: Tracey Weaver MRN: [id number] DOB/AGE: [birth year] 58 y.o.  Admit date: 11/21/2023 Discharge date: 11/22/2023  Admission Diagnoses:  Tear of left acetabular labrum, initial encounter [S73.192A] Primary osteoarthritis of left hip [M16.12] S/P total left hip arthroplasty [Z96.642]   Discharge Diagnoses: Patient Active Problem List   Diagnosis Date Noted   S/P total left hip arthroplasty 11/21/2023   Genetic testing 11/21/2023   Greater trochanteric bursitis of left hip 11/04/2018   Bilateral pes planus 03/27/2017   Tibialis posterior tendonitis 03/27/2017   History of cervical cancer 03/05/2017   Hot flashes 03/05/2017   Family history of premature coronary artery disease 01/31/2017   Mixed hyperlipidemia 01/31/2017   Paroxysmal tachycardia (HCC) 01/31/2017   IC (interstitial cystitis) 01/15/2017   GERD (gastroesophageal reflux disease) 01/04/2014   IBS (irritable bowel syndrome) 01/04/2014   Benign paroxysmal positional vertigo 11/24/2012   Splenomegaly 11/24/2012   Motion sickness 11/24/2012    Past Medical History:  Diagnosis Date   Adjustment disorder with depressed mood    Anxiety    BPPV (benign paroxysmal positional vertigo)    Cervical cancer (HCC)    precancerous cells   Chest pain    Constipation    Degenerative disc disease, cervical    Dysplastic nevus 08/27/2013   s/p resection by Dr. Ebony Cargo.   Dysthymic disorder    GERD (gastroesophageal reflux disease)    Headache    from pinched nerve in neck   History of ectopic pregnancy    Multiple   Hyperlipidemia    Hypertension 05/19/2018   IBS (irritable bowel syndrome)    Constipation; Rx for Amitiza worked well.   Interstitial cystitis    [facility]/Urology; s/p bladder biopsy; s/p cystoscopy x 3 in 2015.   Kidney stones    Liver function study, abnormal    Neck stiffness    limited left turn   Osteoarthritis of left hip    Paroxysmal tachycardia (HCC)     Pneumonia    Pyelonephritis    Recurrent UTI    Splenomegaly    s/p GI consult and hematology consult   Tear of left acetabular labrum    Ulcer 09/28/2011   EGD: +gastric ulcer; rx for Dexilant.  [name].   Vertigo      Transfusion: none   Consultants (if any):   Discharged Condition: Improved  Hospital Course: Tracey Weaver is an 58 y.o. female who was admitted 11/21/2023 with a diagnosis of S/P total left hip arthroplasty and went to the operating room on 11/21/2023 and underwent the above named procedures.    Surgeries: Procedure(s): ARTHROPLASTY, HIP, TOTAL, ANTERIOR APPROACH on 11/21/2023 Patient tolerated the surgery well. Taken to PACU where she was stabilized and then transferred to the orthopedic floor.  Started on Lovenox 40 mg q 24 hrs. TEDs and SCDs applied bilaterally. Heels elevated on bed. No evidence of DVT. Negative Homan. Physical therapy started on day #1 for gait training and transfer. OT started day #1 for ADL and assisted devices.  Patient's IV was d/c on day #1. Patient was able to safely and independently complete all PT goals. PT recommending discharge to home.    On post op day #1 patient was stable and ready for discharge to home with HHPT.  Implants: Cup: Trident Tritanium Clusterhole 48/D w/ x2 screws    Liner: Neutral X3 poly 36/D  Stem: Insignia #4 high offset  Head:36 +33mm biolox ceramic   She was given perioperative antibiotics:  Anti-infectives (From  admission, onward)    Start     Dose/Rate Route Frequency Ordered Stop   11/21/23 1700  ceFAZolin (ANCEF) IVPB 2g/100 mL premix        2 g 200 mL/hr over 30 Minutes Intravenous Every 6 hours 11/21/23 1650 11/22/23 0003   11/21/23 0600  ceFAZolin (ANCEF) IVPB 2g/100 mL premix        2 g 200 mL/hr over 30 Minutes Intravenous On call to O.R. 11/20/23 2146 11/21/23 1108     .  She was given sequential compression devices, early ambulation, and Lovenox TEDs for DVT prophylaxis.  She benefited  maximally from the hospital stay and there were no complications.    Recent vital signs:  Vitals:   11/22/23 0732 11/22/23 0735  BP: 98/63 96/61  Pulse: 62   Resp: 15   Temp:    SpO2: 100% 100%    Recent laboratory studies:  Lab Results  Component Value Date   HGB 10.6 (L) 11/22/2023   HGB 14.5 10/25/2023   HGB 14.2 08/23/2023   Lab Results  Component Value Date   WBC 10.6 (H) 11/22/2023   PLT 177 11/22/2023   Lab Results  Component Value Date   INR 1.0 10/25/2023   Lab Results  Component Value Date   NA 140 11/22/2023   K 4.3 11/22/2023   CL 108 11/22/2023   CO2 26 11/22/2023   BUN 13 11/22/2023   CREATININE 0.55 11/22/2023   GLUCOSE 114 (H) 11/22/2023    Discharge Medications:   Allergies as of 11/22/2023       Reactions   Meloxicam Other (See Comments)   "MAKES ME FEEL FUNNY", Dizziness        Medication List     STOP taking these medications    diclofenac 75 MG EC tablet Commonly known as: VOLTAREN       TAKE these medications    acetaminophen 500 MG tablet Commonly known as: TYLENOL Take 2 tablets (1,000 mg total) by mouth every 8 (eight) hours.   ASHWAGANDHA GUMMIES PO Take 300 mg by mouth at bedtime.   CASCARA SAGRADA PO Take 400 mg by mouth in the morning.   celecoxib 200 MG capsule Commonly known as: CeleBREX Take 1 capsule (200 mg total) by mouth 2 (two) times daily for 14 days.   docusate sodium 100 MG capsule Commonly known as: COLACE Take 1 capsule (100 mg total) by mouth 2 (two) times daily.   enoxaparin 40 MG/0.4ML injection Commonly known as: LOVENOX Inject 0.4 mLs (40 mg total) into the skin daily for 14 days. Start taking on: November 23, 2023   estradiol 0.5 MG tablet Commonly known as: ESTRACE TAKE 1 & 1/2 TABLETS (0.75 MG TOTAL) BY MOUTH DAILY. What changed:  how much to take how to take this when to take this additional instructions   gabapentin 100 MG capsule Commonly known as: NEURONTIN Take 1 capsule  (100 mg total) by mouth at bedtime.   GLUCOSAMINE PO Take 3,000 mg by mouth daily. 1500 each   MORINGA PO Take 2,000 mg by mouth daily. 1000 mg each   multivitamin capsule Take 1 capsule by mouth daily. Gummy   ondansetron 4 MG tablet Commonly known as: ZOFRAN Take 1 tablet (4 mg total) by mouth every 6 (six) hours as needed for nausea.   oxyCODONE 5 MG immediate release tablet Commonly known as: Oxy IR/ROXICODONE Take 1 tablet (5 mg total) by mouth every 6 (six) hours as needed for severe pain (  pain score 7-10) or breakthrough pain.   QC TUMERIC COMPLEX PO Take 2,000 mg by mouth daily. 1000 mg each   Retin-A Micro Pump 0.06 % Gel Generic drug: Tretinoin Microsphere APPLY 1 APPLICATION ON THE SKIN AT NIGHT/BEDTIME   Sleep Aid 25 MG Caps Generic drug: diphenhydrAMINE HCl (Sleep) Take 25 mg by mouth at bedtime.   traMADol 50 MG tablet Commonly known as: ULTRAM Take 1 tablet (50 mg total) by mouth every 6 (six) hours as needed for moderate pain (pain score 4-6).   Vitamin D-3 125 MCG (5000 UT) Tabs Take 5,000 Units by mouth daily.               Durable Medical Equipment  (From admission, onward)           Start     Ordered   11/22/23 0906  For home use only DME 3 n 1  Once        11/22/23 0905   11/22/23 0906  For home use only DME Walker  Once       Question:  Patient needs a walker to treat with the following condition  Answer:  Status post total hip replacement, left   11/22/23 0905            Diagnostic Studies: DG HIP UNILAT WITH PELVIS 2-3 VIEWS LEFT Result Date: 11/21/2023 CLINICAL DATA:  Elective surgery.  Intraoperative fluoroscopy. EXAM: DG HIP (WITH OR WITHOUT PELVIS) 2-3V LEFT COMPARISON:  CT abdomen and pelvis 08/29/2014. FINDINGS: Images were performed intraoperatively without the presence of a radiologist. New total left hip arthroplasty. No hardware complication is seen. Total fluoroscopy images: 3 Total fluoroscopy time: 17 seconds Total  dose: Radiation Exposure Index (as provided by the fluoroscopic device): 1.93 mGy air Kerma Please see intraoperative findings for further detail. IMPRESSION: Intraoperative fluoroscopy for total left hip arthroplasty. Electronically Signed   By: Neita Garnet M.D.   On: 11/21/2023 12:45   DG C-Arm 1-60 Min-No Report Result Date: 11/21/2023 Fluoroscopy was utilized by the requesting physician.  No radiographic interpretation.   DG C-Arm 1-60 Min-No Report Result Date: 11/21/2023 Fluoroscopy was utilized by the requesting physician.  No radiographic interpretation.    Disposition: Discharge disposition: 06-Home-Health Care Svc          Follow-up Information     Evon Slack, [name] Follow up in 2 week(s).   Specialties: Orthopedic Surgery, Emergency Medicine Contact information: [address] [place] [address] [phone]                  Signed: Evon Slack 11/22/2023, 9:14 AM

## 2023-11-22 NOTE — Evaluation (Signed)
 Physical Therapy Evaluation and Discharge  Patient Details Name: ALAZNE QUANT MRN: [id number] DOB: [birth year] Today's Date: 11/22/2023  History of Present Illness  Patient is a 58 year old female with left hip  osteoarthritis, Left hip labral tear, Left hip impingement s/p left total hip arthroplasty.  Clinical Impression  Patient is agreeable to PT and eager to discharge home today. The patient is independent at baseline without assistive device and is working as a Producer, television/film/video. She lives with her spouse and has supportive family that can assist as needed at discharge.  Today, the patient required CGA for standing from bed and from toilet. Gait training initiated in hallway with occasional cues for sequencing and improved gait kinematics. Stair training completed. Mobility is adequate for discharge home. Recommend PT follow up per physician recommendations after this hospital stay. No further acute PT needs at this time as patient is anticipated to discharge soon.       If plan is discharge home, recommend the following: Assist for transportation;Help with stairs or ramp for entrance   Can travel by private vehicle        Equipment Recommendations Rolling walker (2 wheels);BSC/3in1  Recommendations for Other Services       Functional Status Assessment Patient has had a recent decline in their functional status and demonstrates the ability to make significant improvements in function in a reasonable and predictable amount of time.     Precautions / Restrictions Precautions Precautions: Anterior Hip Precaution Booklet Issued: Yes (comment) Recall of Precautions/Restrictions: Intact Restrictions Weight Bearing Restrictions Per Provider Order: Yes LLE Weight Bearing Per Provider Order: Weight bearing as tolerated      Mobility  Bed Mobility Overal bed mobility: Needs Assistance Bed Mobility: Supine to Sit, Sit to Supine     Supine to sit: Supervision Sit to supine: Min  assist   General bed mobility comments: assistance for LLE support to return to bed    Transfers Overall transfer level: Needs assistance Equipment used: Rolling walker (2 wheels) Transfers: Sit to/from Stand Sit to Stand: Contact guard assist                Ambulation/Gait Ambulation/Gait assistance: Contact guard assist, Supervision Gait Distance (Feet): 150 Feet Assistive device: Rolling walker (2 wheels) Gait Pattern/deviations: Step-to pattern, Decreased stance time - right (mild internal rotation of LLE with stance phase of gait) Gait velocity: decreased     General Gait Details: cues for BLE and rolling walker sequencing. cues to avoid internal rotation of LLE with stance phase of gait (patient reports she has historically done this for pain control). cues also to avoid picking up the rolling walker with each step  Stairs Stairs: Yes Stairs assistance: Supervision Stair Management: Two rails, Step to pattern, Forwards Number of Stairs: 4 General stair comments: patient went up/down 4 steps with 2 rails without difficulty. after initial demonstration and cues for correct sequencing, patient demonstrated correct technique  Wheelchair Mobility     Tilt Bed    Modified Rankin (Stroke Patients Only)       Balance Overall balance assessment: Needs assistance Sitting-balance support: Feet supported Sitting balance-Leahy Scale: Good Sitting balance - Comments: patient is able to complete toileting tasks without loss of balance   Standing balance support: Bilateral upper extremity supported Standing balance-Leahy Scale: Fair Standing balance comment: using rolling walker for UE support in standing  Pertinent Vitals/Pain Pain Assessment Pain Assessment: Faces Faces Pain Scale: Hurts little more Pain Location: L  hip Pain Descriptors / Indicators: Discomfort Pain Intervention(s): Limited activity within patient's  tolerance, Monitored during session, Repositioned, Ice applied    Home Living Family/patient expects to be discharged to:: Private residence Living Arrangements: Spouse/significant other Available Help at Discharge: Family Type of Home: House Home Access: Stairs to enter Entrance Stairs-Rails: Lawyer of Steps: 5   Home Layout: One level Home Equipment: Shower seat - built in      Prior Function Prior Level of Function : Independent/Modified Independent;Working/employed             Mobility Comments: independent without device ADLs Comments: independent     Extremity/Trunk Assessment   Upper Extremity Assessment Upper Extremity Assessment: Overall WFL for tasks assessed    Lower Extremity Assessment Lower Extremity Assessment: LLE deficits/detail LLE Deficits / Details: pain with AROM of L hip. no knee buckling with weight bearing    Cervical / Trunk Assessment Cervical / Trunk Assessment: Normal  Communication   Communication Communication: No apparent difficulties    Cognition Arousal: Alert Behavior During Therapy: WFL for tasks assessed/performed   PT - Cognitive impairments: No apparent impairments                         Following commands: Intact       Cueing Cueing Techniques: Verbal cues     General Comments      Exercises     Assessment/Plan    PT Assessment All further PT needs can be met in the next venue of care  PT Problem List Decreased strength;Decreased range of motion;Decreased balance;Decreased activity tolerance;Decreased mobility;Pain;Decreased knowledge of use of DME       PT Treatment Interventions      PT Goals (Current goals can be found in the Care Plan section)  Acute Rehab PT Goals Patient Stated Goal: go home today PT Goal Formulation: All assessment and education complete, DC therapy    Frequency       Co-evaluation               AM-PAC PT "6 Clicks" Mobility   Outcome Measure Help needed turning from your back to your side while in a flat bed without using bedrails?: A Little Help needed moving from lying on your back to sitting on the side of a flat bed without using bedrails?: A Little Help needed moving to and from a bed to a chair (including a wheelchair)?: A Little Help needed standing up from a chair using your arms (e.g., wheelchair or bedside chair)?: A Little Help needed to walk in hospital room?: A Little Help needed climbing 3-5 steps with a railing? : A Little 6 Click Score: 18    End of Session Equipment Utilized During Treatment: Gait belt Activity Tolerance: Patient tolerated treatment well Patient left: in bed;with call bell/phone within reach;with SCD's reapplied;with family/visitor present Nurse Communication: Mobility status (need for DME) PT Visit Diagnosis: Difficulty in walking, not elsewhere classified (R26.2);Other abnormalities of gait and mobility (R26.89)    Time: 6295-2841 PT Time Calculation (min) (ACUTE ONLY): 30 min   Charges:   PT Evaluation $PT Eval Low Complexity: 1 Low PT Treatments $Gait Training: 8-22 mins PT General Charges $$ ACUTE PT VISIT: 1 Visit         Donna Bernard, PT, MPT   Ina Homes 11/22/2023, 9:18 AM

## 2023-11-22 NOTE — TOC Progression Note (Signed)
 Transition of Care [facility]) - Progression Note    Patient Details  Name: Tracey Weaver MRN: [id number] Date of Birth: [birth year]  Transition of Care [facility]) CM/SW Contact  Marlowe Sax, RN Phone Number: 11/22/2023, 9:34 AM  Clinical Narrative:     The patient is set up with [facility] thru [facility] prior to surgery by surgeons office  RW and 3 in 1 to be delivered to the bedside by adapt      Expected Discharge Plan and Services         Expected Discharge Date: 11/22/23                                     Social Determinants of Health (SDOH) Interventions SDOH Screenings   Food Insecurity: No Food Insecurity (11/21/2023)  Housing: Unknown (11/21/2023)  Transportation Needs: No Transportation Needs (11/21/2023)  Utilities: Not At Risk (11/21/2023)  Alcohol Screen: Low Risk  (08/23/2023)  Depression (PHQ2-9): Low Risk  (10/25/2023)  Financial Resource Strain: Low Risk  (10/09/2023)   Received from [facility]  Physical Activity: Insufficiently Active (08/23/2023)  Social Connections: Moderately Integrated (08/23/2023)  Stress: No Stress Concern Present (08/23/2023)  Tobacco Use: Low Risk  (11/21/2023)    Readmission Risk Interventions     No data to display

## 2023-11-22 NOTE — Discharge Instructions (Addendum)
 Instructions after Anterior Total Hip Replacement        Dr. Regenia Skeeter., M.D.      Dept. of Orthopaedics & Sports Medicine  [facility]  [address]  [place], [place]  [address]  Phone: [phone]   Fax: [phone]    DIET: Drink plenty of non-alcoholic fluids. Resume your normal diet. Include foods high in fiber.  ACTIVITY:  You may use crutches or a walker with weight-bearing as tolerated, unless instructed otherwise. You may be weaned off of the walker or crutches by your Physical Therapist.  Continue doing gentle exercises. Exercising will reduce the pain and swelling, increase motion, and prevent muscle weakness.   Please continue to use the TED compression stockings for 2 weeks. You may remove the stockings at night, but should reapply them in the morning. Do not drive or operate any equipment until instructed.  WOUND CARE:  Continue to use ice packs periodically to reduce pain and swelling. You may shower with honeycomb dressing 3 days after your surgery. Do not submerge incision site under water. Remove honeycomb dressing 7 days after surgery and allow dermabond to fall off on its own.   MEDICATIONS: You may resume your regular medications. Please take the pain medication as prescribed on the medication list. Do not take pain medication on an empty stomach. You have been given a prescription for a blood thinner to prevent blood clots. Please take the medication as instructed. (NOTE: After completing a 2 week course of Lovenox, take one Enteric-coated 81 mg aspirin twice a day for 3 additional weeks.) Pain medications and iron supplements can cause constipation. Use a stool softener (Senokot or Colace) on a daily basis and a laxative (dulcolax or miralax) as needed. Do not drive or drink alcoholic beverages when taking pain medications.  POSTOPERATIVE CONSTIPATION PROTOCOL Constipation - defined medically as fewer than three stools per week and  severe constipation as less than one stool per week.  One of the most common issues patients have following surgery is constipation.  Even if you have a regular bowel pattern at home, your normal regimen is likely to be disrupted due to multiple reasons following surgery.  Combination of anesthesia, postoperative narcotics, change in appetite and fluid intake all can affect your bowels.  In order to avoid complications following surgery, here are some recommendations in order to help you during your recovery period.  Colace (docusate) - Pick up an over-the-counter form of Colace or another stool softener and take twice a day as long as you are requiring postoperative pain medications.  Take with a full glass of water daily.  If you experience loose stools or diarrhea, hold the colace until you stool forms back up.  If your symptoms do not get better within 1 week or if they get worse, check with your doctor.  Dulcolax (bisacodyl) - Pick up over-the-counter and take as directed by the product packaging as needed to assist with the movement of your bowels.  Take with a full glass of water.  Use this product as needed if not relieved by Colace only.   MiraLax (polyethylene glycol) - Pick up over-the-counter to have on hand.  MiraLax is a solution that will increase the amount of water in your bowels to assist with bowel movements.  Take as directed and can mix with a glass of water, juice, soda, coffee, or tea.  Take if you go more than two days without a movement. Do not use MiraLax more than  once per day. Call your doctor if you are still constipated or irregular after using this medication for 7 days in a row.  If you continue to have problems with postoperative constipation, please contact the office for further assistance and recommendations.  If you experience "the worst abdominal pain ever" or develop nausea or vomiting, please contact the office immediatly for further recommendations for  treatment.   CALL THE OFFICE FOR: Temperature above 101 degrees Excessive bleeding or drainage on the dressing. Excessive swelling, coldness, or paleness of the toes. Persistent nausea and vomiting.  FOLLOW-UP:  You should have an appointment to return to the office in 2 weeks after surgery. Arrangements have been made for continuation of Physical Therapy (either home therapy or outpatient therapy).     [facility] They will call you to set up when they are coming out to see you   [address], [place], [place] [address] Hours:  Open ? Closes 5?PM Phone: [phone]

## 2023-11-22 NOTE — Progress Notes (Signed)
 Patient is not able to walk the distance required to go the bathroom, or she is unable to safely negotiate stairs required to access the bathroom.  A 3in1 BSC will alleviate this problem.       Lollie Marrow, [name] [facility]

## 2023-11-22 NOTE — Progress Notes (Signed)
   Subjective: 1 Day Post-Op Procedure(s) (LRB): ARTHROPLASTY, HIP, TOTAL, ANTERIOR APPROACH (Left) Patient reports pain as mild.   Patient is well, and has had no acute complaints or problems Denies any CP, SOB, ABD pain. We will continue therapy today.  Plan is to go Home after hospital stay.  Objective: Vital signs in last 24 hours: Temp:  [97 F (36.1 C)-98 F (36.7 C)] 97.6 F (36.4 C) (03/28 0259) Pulse Rate:  [58-91] 62 (03/28 0732) Resp:  [12-24] 15 (03/28 0732) BP: (76-139)/(43-85) 96/61 (03/28 0735) SpO2:  [95 %-100 %] 100 % (03/28 0735) Weight:  [74.3 kg] 74.3 kg (03/27 0924)  Intake/Output from previous day: 03/27 0701 - 03/28 0700 In: 3380.5 [P.O.:240; I.V.:2740.5; IV Piggyback:400] Out: 900 [Urine:750; Blood:150] Intake/Output this shift: No intake/output data recorded.  Recent Labs    11/22/23 0628  HGB 10.6*   Recent Labs    11/22/23 0628  WBC 10.6*  RBC 3.34*  HCT 31.1*  PLT 177   Recent Labs    11/22/23 0628  NA 140  K 4.3  CL 108  CO2 26  BUN 13  CREATININE 0.55  GLUCOSE 114*  CALCIUM 8.5*   No results for input(s): "LABPT", "INR" in the last 72 hours.  EXAM General - Patient is Alert, Appropriate, and Oriented Extremity - Neurovascular intact Sensation intact distally Intact pulses distally Dorsiflexion/Plantar flexion intact No cellulitis present Compartment soft Dressing - dressing C/D/I and no drainage Motor Function - intact, moving foot and toes well on exam.   Past Medical History:  Diagnosis Date   Adjustment disorder with depressed mood    Anxiety    BPPV (benign paroxysmal positional vertigo)    Cervical cancer (HCC)    precancerous cells   Chest pain    Constipation    Degenerative disc disease, cervical    Dysplastic nevus 08/27/2013   s/p resection by Dr. Ebony Cargo.   Dysthymic disorder    GERD (gastroesophageal reflux disease)    Headache    from pinched nerve in neck   History of ectopic  pregnancy    Multiple   Hyperlipidemia    Hypertension 05/19/2018   IBS (irritable bowel syndrome)    Constipation; Rx for Amitiza worked well.   Interstitial cystitis    [facility]/Urology; s/p bladder biopsy; s/p cystoscopy x 3 in 2015.   Kidney stones    Liver function study, abnormal    Neck stiffness    limited left turn   Osteoarthritis of left hip    Paroxysmal tachycardia (HCC)    Pneumonia    Pyelonephritis    Recurrent UTI    Splenomegaly    s/p GI consult and hematology consult   Tear of left acetabular labrum    Ulcer 09/28/2011   EGD: +gastric ulcer; rx for Dexilant.  [name].   Vertigo     Assessment/Plan:   1 Day Post-Op Procedure(s) (LRB): ARTHROPLASTY, HIP, TOTAL, ANTERIOR APPROACH (Left) Principal Problem:   S/P total left hip arthroplasty  Estimated body mass index is 26.84 kg/m as calculated from the following:   Height as of this encounter: 5' 5.5" (1.664 m).   Weight as of this encounter: 74.3 kg. Advance diet Up with therapy Pain well controlled Labs and VSS CM to assist with discharge to home with HHPT today  DVT Prophylaxis - Lovenox, TED hose, and SCDs Weight-Bearing as tolerated to left leg   T. Cranston Neighbor, [name] [facility] 11/22/2023, 9:10 AM

## 2023-11-29 ENCOUNTER — Telehealth: Payer: Self-pay | Admitting: Genetic Counselor

## 2023-11-29 NOTE — Telephone Encounter (Signed)
 Contacted patient in attempt to disclose results of genetic testing.  LVM with contact information requesting a call back.

## 2023-12-02 ENCOUNTER — Telehealth: Payer: Self-pay | Admitting: Genetic Counselor

## 2023-12-02 NOTE — Telephone Encounter (Signed)
Disclosed negative genetics and VUS in DICER1.

## 2023-12-02 NOTE — Anesthesia Postprocedure Evaluation (Signed)
 Anesthesia Post Note  Patient: Tracey Weaver  Procedure(s) Performed: ARTHROPLASTY, HIP, TOTAL, ANTERIOR APPROACH (Left: Hip)  Patient location during evaluation: Nursing Unit Anesthesia Type: Combined General/Spinal Level of consciousness: awake and alert Pain management: pain level controlled Vital Signs Assessment: post-procedure vital signs reviewed and stable Respiratory status: spontaneous breathing, nonlabored ventilation, respiratory function stable and patient connected to nasal cannula oxygen Cardiovascular status: blood pressure returned to baseline and stable Postop Assessment: no apparent nausea or vomiting Anesthetic complications: no   No notable events documented.   Last Vitals:  Vitals:   11/22/23 0735 11/22/23 1015  BP: 96/61 112/60  Pulse:  67  Resp:    Temp:    SpO2: 100%     Last Pain:  Vitals:   11/22/23 1022  TempSrc:   PainSc: 6                  Stephanie Coup

## 2023-12-03 ENCOUNTER — Ambulatory Visit: Payer: Self-pay | Admitting: Genetic Counselor

## 2023-12-03 DIAGNOSIS — Z801 Family history of malignant neoplasm of trachea, bronchus and lung: Secondary | ICD-10-CM

## 2023-12-03 DIAGNOSIS — Z8042 Family history of malignant neoplasm of prostate: Secondary | ICD-10-CM

## 2023-12-03 DIAGNOSIS — Z807 Family history of other malignant neoplasms of lymphoid, hematopoietic and related tissues: Secondary | ICD-10-CM

## 2023-12-03 DIAGNOSIS — Z803 Family history of malignant neoplasm of breast: Secondary | ICD-10-CM

## 2023-12-03 DIAGNOSIS — Z1379 Encounter for other screening for genetic and chromosomal anomalies: Secondary | ICD-10-CM

## 2023-12-03 DIAGNOSIS — Z800 Family history of malignant neoplasm of digestive organs: Secondary | ICD-10-CM

## 2023-12-03 NOTE — Progress Notes (Signed)
 HPI:   Tracey Weaver was previously seen in the [facility] due to a family history of cancer and concerns regarding a hereditary predisposition to cancer.    Tracey Weaver recent genetic test results were disclosed to her by telephone. These results and recommendations are discussed in more detail below.  CANCER HISTORY:  Tracey Weaver is a 58 y.o. female with no personal history of cancer.  She reports pre-cancerous cells in uterus at age 24.  FAMILY HISTORY:  We obtained a detailed, 4-generation family history.  Significant diagnoses are listed below:      Family History  Problem Relation Age of Onset   Colon polyps Mother     Prostate cancer Father          mets to unknown site   Lymphoma Brother          Large B cell; passed from Sepsis   Cancer Brother 40        lung, germ cell; lobectomy at 16   Breast cancer Paternal Aunt          dx >50   Cervical cancer Paternal Aunt          dx 6s   Colon cancer Paternal Aunt          dx >50   Cancer Paternal Uncle          unknown type; dx 47s   Breast cancer Paternal Grandmother 51   Cervical cancer Cousin          dx 52s; paternal cousin   Leukemia Cousin          dx late 35s; maternal female cousin       Tracey Weaver is unaware of previous family history of genetic testing for hereditary cancer risks. There is no reported Ashkenazi Jewish ancestry. There is no known consanguinity.  GENETIC TEST RESULTS:  The [facility] CancerNext-Expanded +RNAinsight Panel found no pathogenic mutations.   The CancerNext-Expanded gene panel offered by [facility] and includes sequencing, rearrangement, and RNA analysis for the following 76 genes: AIP, ALK, APC, ATM, AXIN2, BAP1, BARD1, BMPR1A, BRCA1, BRCA2, BRIP1, CDC73, CDH1, CDK4, CDKN1B, CDKN2A, CEBPA, CHEK2, CTNNA1, DDX41, DICER1, ETV6, FH, FLCN, GATA2, LZTR1, MAX, MBD4, MEN1, MET, MLH1, MSH2, MSH3, MSH6, MUTYH, NF1, NF2, NTHL1, PALB2, PHOX2B, PMS2, POT1, PRKAR1A, PTCH1, PTEN, RAD51C,  RAD51D, RB1, RET, RUNX1, SDHA, SDHAF2, SDHB, SDHC, SDHD, SMAD4, SMARCA4, SMARCB1, SMARCE1, STK11, SUFU, TMEM127, TP53, TSC1, TSC2, VHL, and WT1 (sequencing and deletion/duplication); EGFR, HOXB13, KIT, MITF, PDGFRA, POLD1, and POLE (sequencing only); EPCAM and GREM1 (deletion/duplication only).  .   The test report has been scanned into EPIC and is located under the Molecular Pathology section of the Results Review tab.  A portion of the Weaver report is included below for reference. Genetic testing reported out on November 18, 2023.      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  At this time, it is unknown if this variant is associated with increased cancer risk or if this is a normal finding, but most variants such as this get reclassified to being inconsequential. It should not be used to make medical management decisions. With time, we suspect the lab will determine the significance of this variant, if any. If we do learn more about it, we will try to contact Tracey Weaver to discuss it further. However, it is important to stay in touch with [place] periodically and keep  the address and phone number up to date.   Even though a pathogenic variant was not identified, possible explanations for the cancer in the family may include: There may be no hereditary risk for cancer in the family. The cancers in Tracey Weaver's family may be sporadic/familial or due to other genetic and environmental factors.  Most cancer is not hereditary.  There may be a gene mutation in one of these genes that current testing methods cannot detect but that chance is small. There could be another gene that has not yet been discovered, or that we have not yet tested, that is responsible for the cancer diagnoses in the family.  It is also possible there is a hereditary cause for the cancer in the family that Tracey Weaver did not inherit.  Therefore, it is important to  remain in touch with cancer genetics in the future so that we can continue to offer Tracey Weaver the most up to date genetic testing.    ADDITIONAL GENETIC TESTING:   Tracey Weaver genetic testing was fairly extensive.  If there are additional relevant genes identified to increase cancer risk that can be analyzed in the future, we would be happy to discuss and coordinate this testing at that time.     CANCER SCREENING RECOMMENDATIONS:  Tracey Weaver is considered negative (normal).  This means that we have not identified a hereditary cause for her family history of cancer at this time.   An individual's cancer risk and medical management are not determined by genetic test results alone. Overall cancer risk assessment incorporates additional factors, including personal medical history, family history, and any available genetic information that may Weaver in a personalized plan for cancer prevention and surveillance. Therefore, it is recommended she continue to follow the cancer management and screening guidelines provided by her primary healthcare provider.  Her estimated lifetime risk for breast cancer based on Tyrer-Cuzick risk model and reported personal/family history is 15.7%.  This is elevated compared to the general population (8% for her age group) but not in the 'high risk for breast cancer' category per NCCN guidelines (>20%).  This risk estimate can change over time and may be repeated to reflect new information in her personal or family history in the future.  She should remain diligent about annual breast imaging.         RECOMMENDATIONS FOR FAMILY MEMBERS:   Since she did not inherit a identifiable mutation in a cancer predisposition gene included on this panel, her children could not have inherited a known mutation from her in one of these genes. Individuals in this family might be at some increased risk of developing cancer, over the general population risk, due to the family  history of cancer.  Individuals in the family should notify their providers of the family history of cancer. We recommend women in this family have a yearly mammogram beginning at age 14, or 89 years younger than the earliest onset of cancer, an annual clinical breast exam, and perform monthly breast self-exams.  Risk models that take into account family history and hormonal history may be helpful in determining appropriate breast cancer screening options for family members.   First degree relatives of those with colon cancer should receive colonoscopies beginning at age 51, or 10 years prior to the earliest diagnosis of colon cancer in the family, and receive colonoscopies at least every 5 years, or as recommended by their gastroenterologist.  Female relatives should speak with providers about prostate cancer  screening.  Other members of the family may still carry a pathogenic variant in one of these genes that Ms. Lich did not inherit. Based on the family history, we recommend first degree relatives of her father, who was diagnosed with metastatic prostate cancer, have genetic counseling and testing. Ms. Cales can let us know if we can be of any assistance in coordinating genetic counseling and/or testing for these family members.   We do not recommend familial testing for the DICER1 variant of uncertain significance (VUS).  FOLLOW-UP:  Cancer genetics is a rapidly advancing field and it is possible that new genetic tests will be appropriate for her and/or her family members in the future. We encourage Ms. Corsello to remain in contact with cancer genetics, so we can update her personal and family histories and let her know of advances in cancer genetics that may benefit this family.   Our contact number was provided.  They are welcome to call us at anytime with additional questions or concerns.   Bethania Schlotzhauer M. Rennie Plowman, MS, [facility] Genetic Counselor [email or web address] (P) [phone]

## 2024-02-18 ENCOUNTER — Other Ambulatory Visit: Payer: Self-pay | Admitting: Family Medicine

## 2024-02-18 NOTE — Telephone Encounter (Unsigned)
 Copied from CRM [id number]. Topic: Clinical - Medication Refill >> Feb 18, 2024  8:11 AM Precious C wrote: Medication: estradiol  (ESTRACE ) 0.5 MG tablet  Has the patient contacted their pharmacy? Yes  (Agent: If yes, when and what did the pharmacy advise?) Pt called the pharmacy ([facility]) today and they informed pt no refills were available.  This is the patient's preferred pharmacy:  [id number] [place], [place] - [address] [place] [address] Phone: [phone] Fax: [phone]  Is this the correct pharmacy for this prescription? Yes If no, delete pharmacy and type the correct one.   Has the prescription been filled recently? No  Is the patient out of the medication? Yes, is out as of yesterday  Has the patient been seen for an appointment in the last year OR does the patient have an upcoming appointment? Yes  Can we respond through MyChart? Yes  Agent: Please be advised that Rx refills may take up to 3 business days. We ask that you follow-up with your pharmacy.

## 2024-02-19 NOTE — Telephone Encounter (Signed)
 Rx 10/25/23 #135 3RF-1 year supply Requested Prescriptions  Pending Prescriptions Disp Refills   estradiol  (ESTRACE ) 0.5 MG tablet 135 tablet 3    Sig: TAKE 1 & 1/2 TABLETS (0.75 MG TOTAL) BY MOUTH DAILY.     OB/GYN:  Estrogens  Passed - 02/19/2024  2:30 PM      Passed - Mammogram is up-to-date per Health Maintenance      Passed - Last BP in normal range    BP Readings from Last 1 Encounters:  11/22/23 112/60         Passed - Valid encounter within last 12 months    Recent Outpatient Visits           3 months ago Routine general medical examination at a health care facility   [facility], [place] P, DO   7 years ago Degenerative disc disease, cervical   [facility] at [facility], [place], [place]   7 years ago Right hip pain   Primary Care at [place], [place] D, [name]   7 years ago Pain in joint involving ankle and foot, unspecified laterality   [facility] at [facility], [place] BRAVO, MD   9 years ago Pelvic pain in female   Primary Care at [facility], [place] HERO, MD

## 2024-02-25 ENCOUNTER — Other Ambulatory Visit: Payer: Self-pay | Admitting: Otolaryngology

## 2024-02-25 DIAGNOSIS — R221 Localized swelling, mass and lump, neck: Secondary | ICD-10-CM

## 2024-02-26 ENCOUNTER — Encounter: Payer: Self-pay | Admitting: Otolaryngology

## 2024-03-02 ENCOUNTER — Ambulatory Visit
Admission: RE | Admit: 2024-03-02 | Discharge: 2024-03-02 | Disposition: A | Discharge status: Home / Self Care | Source: Ambulatory Visit | Attending: Otolaryngology | Admitting: Otolaryngology

## 2024-03-02 DIAGNOSIS — R221 Localized swelling, mass and lump, neck: Secondary | ICD-10-CM

## 2024-03-02 MED ORDER — IOPAMIDOL (ISOVUE-300) INJECTION 61%
75.0000 mL | Freq: Once | INTRAVENOUS | Status: AC | PRN
  Administered 2024-03-02: 75 mL via INTRAVENOUS

## 2024-03-11 ENCOUNTER — Encounter: Payer: Self-pay | Admitting: Nurse Practitioner

## 2024-03-11 ENCOUNTER — Ambulatory Visit (INDEPENDENT_AMBULATORY_CARE_PROVIDER_SITE_OTHER): Admitting: Nurse Practitioner

## 2024-03-11 VITALS — BP 128/81 | HR 69 | Temp 97.9°F | Ht 65.5 in | Wt 166.2 lb

## 2024-03-11 DIAGNOSIS — R5383 Other fatigue: Secondary | ICD-10-CM

## 2024-03-11 DIAGNOSIS — M546 Pain in thoracic spine: Secondary | ICD-10-CM

## 2024-03-11 DIAGNOSIS — R052 Subacute cough: Secondary | ICD-10-CM | POA: Diagnosis not present

## 2024-03-11 MED ORDER — ALBUTEROL SULFATE HFA 108 (90 BASE) MCG/ACT IN AERS: 2.0000 | INHALATION_SPRAY | Freq: Four times a day (QID) | RESPIRATORY_TRACT | 0 refills | Status: DC | PRN

## 2024-03-11 MED ORDER — AMOXICILLIN-POT CLAVULANATE 875-125 MG PO TABS: 1.0000 | ORAL_TABLET | Freq: Two times a day (BID) | ORAL | 0 refills | Status: AC

## 2024-03-11 MED ORDER — HYDROCOD POLI-CHLORPHE POLI ER 10-8 MG/5ML PO SUER: 5.0000 mL | Freq: Every evening | ORAL | 0 refills | Status: AC | PRN

## 2024-03-11 MED ORDER — PREDNISONE 20 MG PO TABS
40.0000 mg | ORAL_TABLET | Freq: Every day | ORAL | 0 refills | Status: AC
Start: 2024-03-11 — End: 2024-03-16

## 2024-03-11 NOTE — Progress Notes (Signed)
 BP 128/81   Pulse 69   Temp 97.9 F (36.6 C) (Oral)   Ht 5' 5.5 (1.664 m)   Wt 166 lb 3.2 oz (75.4 kg)   SpO2 97%   BMI 27.24 kg/m    Subjective:    Patient ID: Tracey Weaver, female    DOB: [birth year], 58 y.o.   MRN: [id number]  HPI: Tracey Weaver is a 58 y.o. female  Chief Complaint  Patient presents with   URI    Patient states she has been having a cough, sinus pressure, drainage, and fatigue for the last 2 weeks. States she just over all does not feel good.    Back Pain    Patient states she has noticed some upper back pain for the last few days. States she trimmed some bushes on Monday so could be from that or from coughing.    UPPER RESPIRATORY TRACT INFECTION Has been having cough, sinus issues, and drainage for two weeks -- notices cough more at night and in morning, has sinus drainage all day long.  Feeling very fatigued. This is not improving.  On Monday was out trimming bushes and back began to hurt, she is unsure if this is from cough or from trimming bushes. She would like labs today to ensure nothing else is wrong.  Back pain is a 1-2/10, mid back, comes and goes, notices it more when she coughs.  Dull and aching pain.   Fever: no Cough: yes Shortness of breath: yes occasional Wheezing: no Chest pain: no Chest tightness: no Chest congestion: no Nasal congestion: no Runny nose: yes Post nasal drip: yes Sneezing: no Sore throat: scratchy Swollen glands: no Sinus pressure: no Headache: yes Face pain: no Toothache: no Ear pain: yes left Ear pressure: yes left Eyes red/itching:yes Eye drainage/crusting: no  Vomiting: no Rash: no Fatigue: yes Sick contacts: no Strep contacts: no  Context: stable Recurrent sinusitis: no Relief with OTC cold/cough medications: no  Treatments attempted: Ibuprofen  -- this helps the headaches  Relevant past medical, surgical, family and social history reviewed and updated as indicated. Interim medical history since our  last visit reviewed. Allergies and medications reviewed and updated.  Review of Systems  Constitutional:  Positive for fatigue. Negative for activity change, appetite change, chills, diaphoresis and fever.  HENT:  Positive for postnasal drip and rhinorrhea. Negative for congestion, ear discharge, ear pain, facial swelling, sinus pressure, sinus pain, sneezing, sore throat and voice change.   Respiratory:  Positive for cough and shortness of breath (occasional with coughing spell). Negative for chest tightness and wheezing.   Cardiovascular:  Negative for chest pain, palpitations and leg swelling.  Gastrointestinal: Negative.   Musculoskeletal:  Positive for myalgias.  Neurological:  Positive for headaches. Negative for dizziness and numbness.  Psychiatric/Behavioral: Negative.      Per HPI unless specifically indicated above     Objective:    BP 128/81   Pulse 69   Temp 97.9 F (36.6 C) (Oral)   Ht 5' 5.5 (1.664 m)   Wt 166 lb 3.2 oz (75.4 kg)   SpO2 97%   BMI 27.24 kg/m   Wt Readings from Last 3 Encounters:  03/11/24 166 lb 3.2 oz (75.4 kg)  11/21/23 163 lb 12.8 oz (74.3 kg)  11/11/23 163 lb 12.8 oz (74.3 kg)    Physical Exam Vitals and nursing note reviewed.  Constitutional:      General: She is awake. She is not in acute distress.  Appearance: Normal appearance. She is well-developed and well-groomed. She is not ill-appearing or toxic-appearing.  HENT:     Head: Normocephalic.     Right Ear: Hearing, ear canal and external ear normal. A middle ear effusion is present. There is no impacted cerumen. Tympanic membrane is not injected or perforated.     Left Ear: Hearing, ear canal and external ear normal. A middle ear effusion is present. There is no impacted cerumen. Tympanic membrane is not injected or perforated.     Nose: Rhinorrhea present. Rhinorrhea is clear.     Right Sinus: No maxillary sinus tenderness or frontal sinus tenderness.     Left Sinus: No maxillary  sinus tenderness or frontal sinus tenderness.     Mouth/Throat:     Mouth: Mucous membranes are moist.     Pharynx: Posterior oropharyngeal erythema (mild) present. No pharyngeal swelling or oropharyngeal exudate.  Eyes:     General: Lids are normal.        Right eye: No discharge.        Left eye: No discharge.     Conjunctiva/sclera: Conjunctivae normal.     Pupils: Pupils are equal, round, and reactive to light.  Neck:     Thyroid : No thyromegaly.     Vascular: No carotid bruit.  Cardiovascular:     Rate and Rhythm: Normal rate and regular rhythm.     Heart sounds: Normal heart sounds. No murmur heard.    No gallop.  Pulmonary:     Effort: Pulmonary effort is normal. No accessory muscle usage or respiratory distress.     Breath sounds: Normal breath sounds. No decreased breath sounds, wheezing or rales.  Abdominal:     General: Bowel sounds are normal.     Palpations: Abdomen is soft. There is no hepatomegaly or splenomegaly.  Musculoskeletal:     Cervical back: Normal range of motion and neck supple.     Right lower leg: No edema.     Left lower leg: No edema.  Lymphadenopathy:     Head:     Right side of head: No submental, submandibular, tonsillar, preauricular or posterior auricular adenopathy.     Left side of head: Posterior auricular adenopathy present. No submental, submandibular, tonsillar or preauricular adenopathy.     Cervical: No cervical adenopathy.  Skin:    General: Skin is warm and dry.  Neurological:     Mental Status: She is alert and oriented to person, place, and time.  Psychiatric:        Attention and Perception: Attention normal.        Mood and Affect: Mood normal.        Speech: Speech normal.        Behavior: Behavior normal. Behavior is cooperative.        Thought Content: Thought content normal.     Results for orders placed or performed during the hospital encounter of 11/21/23  ABO/Rh   Collection Time: 11/21/23  9:09 AM  Result Value  Ref Range   ABO/RH(D)      O POS Performed at [facility], [address] [place], [place] [address]   CBC   Collection Time: 11/22/23  6:28 AM  Result Value Ref Range   WBC 10.6 (H) 4.0 - 10.5 K/uL   RBC 3.34 (L) 3.87 - 5.11 MIL/uL   Hemoglobin 10.6 (L) 12.0 - 15.0 g/dL   HCT 68.8 (L) 63.9 - 53.9 %   MCV 93.1 80.0 - 100.0 fL  MCH 31.7 26.0 - 34.0 pg   MCHC 34.1 30.0 - 36.0 g/dL   RDW 87.3 88.4 - 84.4 %   Platelets 177 150 - 400 K/uL   nRBC 0.0 0.0 - 0.2 %  Basic metabolic panel   Collection Time: 11/22/23  6:28 AM  Result Value Ref Range   Sodium 140 135 - 145 mmol/L   Potassium 4.3 3.5 - 5.1 mmol/L   Chloride 108 98 - 111 mmol/L   CO2 26 22 - 32 mmol/L   Glucose, Bld 114 (H) 70 - 99 mg/dL   BUN 13 6 - 20 mg/dL   Creatinine, Ser 9.44 0.44 - 1.00 mg/dL   Calcium  8.5 (L) 8.9 - 10.3 mg/dL   GFR, Estimated >39 >39 mL/min   Anion gap 6 5 - 15      Assessment & Plan:   Problem List Items Addressed This Visit       Other   Subacute cough - Primary   Ongoing cough for two weeks.  Due to length of time for cough will start Augmentin  BID for 7 days + Prednisone  40 MG daily for 5 days.  Ordered Tussionex to use as needed + Albuterol  inhaler.  If not feeling better in one week, reach out to office and will recheck in office.  At that time would benefit a CXR if ongoing. Recommend: - Increasing Fluids - Acetaminophen  / ibuprofen as needed for fever/pain.  - Salt water  gargling, chloraseptic spray and throat lozenges - Mucinex.  - Saline sinus flushes or a neti pot.  - Humidifying the air.       Other Visit Diagnoses       Fatigue, unspecified type       Would like labs checked today to ensure no other underlying cause, discussed with her.  Will check CBC, CMP, TSH.  Suspect fatigue more from acute illness.   Relevant Orders   CBC with Differential/Platelet   Comprehensive metabolic panel with GFR   TSH     Acute midline thoracic back pain       Suspect from  cough for 2 weeks.  Continue Ibuprofen as needed.  May use Icy/Hot Lidocaine  patches or Voltaren  gel + heating pad PRN.   Relevant Medications   diclofenac  (VOLTAREN ) 75 MG EC tablet   predniSONE  (DELTASONE ) 20 MG tablet        Follow up plan: Return if symptoms worsen or fail to improve.

## 2024-03-11 NOTE — Patient Instructions (Signed)

## 2024-03-11 NOTE — Assessment & Plan Note (Signed)
 Ongoing cough for two weeks.  Due to length of time for cough will start Augmentin  BID for 7 days + Prednisone  40 MG daily for 5 days.  Ordered Tussionex to use as needed + Albuterol  inhaler.  If not feeling better in one week, reach out to office and will recheck in office.  At that time would benefit a CXR if ongoing. Recommend: - Increasing Fluids - Acetaminophen  / ibuprofen as needed for fever/pain.  - Salt water  gargling, chloraseptic spray and throat lozenges - Mucinex.  - Saline sinus flushes or a neti pot.  - Humidifying the air.

## 2024-03-12 ENCOUNTER — Ambulatory Visit: Payer: Self-pay | Admitting: Nurse Practitioner

## 2024-03-12 LAB — COMPREHENSIVE METABOLIC PANEL WITH GFR
ALT: 31 IU/L (ref 0–32)
AST: 21 IU/L (ref 0–40)
Albumin: 4.4 g/dL (ref 3.8–4.9)
Alkaline Phosphatase: 69 IU/L (ref 44–121)
BUN/Creatinine Ratio: 31 — ABNORMAL HIGH (ref 9–23)
BUN: 19 mg/dL (ref 6–24)
Bilirubin Total: 0.4 mg/dL (ref 0.0–1.2)
CO2: 23 mmol/L (ref 20–29)
Calcium: 9.3 mg/dL (ref 8.7–10.2)
Chloride: 104 mmol/L (ref 96–106)
Creatinine, Ser: 0.61 mg/dL (ref 0.57–1.00)
Globulin, Total: 2.2 g/dL (ref 1.5–4.5)
Glucose: 83 mg/dL (ref 70–99)
Potassium: 4.2 mmol/L (ref 3.5–5.2)
Sodium: 143 mmol/L (ref 134–144)
Total Protein: 6.6 g/dL (ref 6.0–8.5)
eGFR: 104 mL/min/1.73 (ref >59)

## 2024-03-12 LAB — CBC WITH DIFFERENTIAL/PLATELET
Basophils Absolute: 0 x10E3/uL (ref 0.0–0.2)
Basos: 1 %
EOS (ABSOLUTE): 0.1 x10E3/uL (ref 0.0–0.4)
Eos: 2 %
Hematocrit: 41.4 % (ref 34.0–46.6)
Hemoglobin: 13.7 g/dL (ref 11.1–15.9)
Immature Grans (Abs): 0 x10E3/uL (ref 0.0–0.1)
Immature Granulocytes: 0 %
Lymphocytes Absolute: 2.3 x10E3/uL (ref 0.7–3.1)
Lymphs: 43 %
MCH: 31.1 pg (ref 26.6–33.0)
MCHC: 33.1 g/dL (ref 31.5–35.7)
MCV: 94 fL (ref 79–97)
Monocytes Absolute: 0.3 x10E3/uL (ref 0.1–0.9)
Monocytes: 6 %
Neutrophils Absolute: 2.5 x10E3/uL (ref 1.4–7.0)
Neutrophils: 48 %
Platelets: 223 x10E3/uL (ref 150–450)
RBC: 4.41 x10E6/uL (ref 3.77–5.28)
RDW: 12.1 % (ref 11.7–15.4)
WBC: 5.2 x10E3/uL (ref 3.4–10.8)

## 2024-03-12 LAB — TSH: TSH: 1.76 u[IU]/mL (ref 0.450–4.500)

## 2024-03-12 NOTE — Progress Notes (Signed)
 Contacted via MyChart  Good morning Tracey Weaver, your labs have returned and overall are stable. I suspect fatigue is more from current acute illness.  Any questions? Keep being amazing!!  Thank you for allowing me to participate in your care.  I appreciate you. Kindest regards, Laural Eiland

## 2024-04-07 ENCOUNTER — Other Ambulatory Visit: Payer: Self-pay | Admitting: Nurse Practitioner

## 2024-04-09 NOTE — Telephone Encounter (Signed)
 Requested Prescriptions  Pending Prescriptions Disp Refills   albuterol (VENTOLIN HFA) 108 (90 Base) MCG/ACT inhaler [Pharmacy Med Name: ALBUTEROL HFA (PROVENTIL) INH] 8 g 0    Sig: TAKE 2 PUFFS BY MOUTH EVERY 6 HOURS AS NEEDED FOR WHEEZE OR SHORTNESS OF BREATH     Pulmonology:  Beta Agonists 2 Passed - 04/09/2024  2:22 PM      Passed - Last BP in normal range    BP Readings from Last 1 Encounters:  03/11/24 128/81         Passed - Last Heart Rate in normal range    Pulse Readings from Last 1 Encounters:  03/11/24 69         Passed - Valid encounter within last 12 months    Recent Outpatient Visits           4 weeks ago Subacute cough   [place] [facility] [place], Melanie T, NP   5 months ago Routine general medical examination at a health care facility   [facility], [place] P, DO   7 years ago Degenerative disc disease, cervical   Primary Care at [facility], [place], [place]   7 years ago Right hip pain   Primary Care at [facility] [place], [place] D, [name]   7 years ago Pain in joint involving ankle and foot, unspecified laterality   [facility], [place] BRAVO, MD

## 2024-05-31 ENCOUNTER — Other Ambulatory Visit: Payer: Self-pay | Admitting: Family Medicine

## 2024-05-31 DIAGNOSIS — M2550 Pain in unspecified joint: Secondary | ICD-10-CM

## 2024-06-02 NOTE — Telephone Encounter (Signed)
 Requested medication (s) are due for refill today: routing for review  Requested medication (s) are on the active medication list: no  Last refill:  03/11/24  Future visit scheduled: no  Notes to clinic:  historical medication     Requested Prescriptions  Pending Prescriptions Disp Refills   diclofenac  (VOLTAREN ) 75 MG EC tablet [Pharmacy Med Name: DICLOFENAC  SOD EC 75 MG TAB] 180 tablet 1    Sig: TAKE 1 TABLET (75 MG TOTAL) BY MOUTH 2 (TWO) TIMES DAILY AS NEEDED. TAKE 1 TABLET BY MOUTH 2 TIMES DAILY AS NEEDED.     Analgesics:  NSAIDS Failed - 06/02/2024  9:08 AM      Failed - Manual Review: Labs are only required if the patient has taken medication for more than 8 weeks.      Passed - Cr in normal range and within 360 days    Creatinine  Date Value Ref Range Status  12/15/2014 0.71 mg/dL Final    Comment:    9.55-8.99 NOTE: New Reference Range  11/02/14    Creat  Date Value Ref Range Status  11/24/2014 0.66 0.50 - 1.10 mg/dL Final   Creatinine, Ser  Date Value Ref Range Status  03/11/2024 0.61 0.57 - 1.00 mg/dL Final         Passed - HGB in normal range and within 360 days    Hemoglobin  Date Value Ref Range Status  03/11/2024 13.7 11.1 - 15.9 g/dL Final         Passed - PLT in normal range and within 360 days    Platelets  Date Value Ref Range Status  03/11/2024 223 150 - 450 x10E3/uL Final         Passed - HCT in normal range and within 360 days    Hematocrit  Date Value Ref Range Status  03/11/2024 41.4 34.0 - 46.6 % Final         Passed - eGFR is 30 or above and within 360 days    GFR, Est African American  Date Value Ref Range Status  11/30/2013 >89 mL/min Final   EGFR (African American)  Date Value Ref Range Status  12/15/2014 >60  Final   GFR calc Af Amer  Date Value Ref Range Status  10/05/2020 118 >59 mL/min/1.73 Final    Comment:    **In accordance with recommendations from the NKF-ASN Task force,**   [facility] is in the process of  updating its eGFR calculation to the   2021 CKD-EPI creatinine equation that estimates kidney function   without a race variable.    GFR, Est Non African American  Date Value Ref Range Status  11/30/2013 >89 mL/min Final    Comment:      The estimated GFR is a calculation valid for adults (>=82 years old) that uses the CKD-EPI algorithm to adjust for age and sex. It is   not to be used for children, pregnant women, hospitalized patients,    patients on dialysis, or with rapidly changing kidney function. According to the NKDEP, eGFR >89 is normal, 60-89 shows mild impairment, 30-59 shows moderate impairment, 15-29 shows severe impairment and <15 is ESRD.     EGFR (Non-African Amer.)  Date Value Ref Range Status  12/15/2014 >60  Final    Comment:    eGFR values <60mL/min/1.73 m2 may be an indication of chronic kidney disease (CKD). Calculated eGFR is useful in patients with stable renal function. The eGFR calculation will not be reliable in acutely ill  patients when serum creatinine is changing rapidly. It is not useful in patients on dialysis. The eGFR calculation may not be applicable to patients at the low and high extremes of body sizes, pregnant women, and vegetarians.    GFR, Estimated  Date Value Ref Range Status  11/22/2023 >60 >60 mL/min Final    Comment:    (NOTE) Calculated using the CKD-EPI Creatinine Equation (2021)    eGFR  Date Value Ref Range Status  03/11/2024 104 >59 mL/min/1.73 Final         Passed - Patient is not pregnant      Passed - Valid encounter within last 12 months    Recent Outpatient Visits           2 months ago Subacute cough   [facility] [place], Melanie T, NP   7 months ago Routine general medical examination at a health care facility   [facility], [place] P, DO   7 years ago Degenerative disc disease, cervical   Primary Care at [facility], [place], [place]   7 years ago  Right hip pain   Primary Care at [place], [place] D, [name]   7 years ago Pain in joint involving ankle and foot, unspecified laterality   [facility], [place] BRAVO, MD

## 2024-06-22 ENCOUNTER — Other Ambulatory Visit: Payer: Self-pay | Admitting: Family Medicine

## 2024-06-22 DIAGNOSIS — Z1231 Encounter for screening mammogram for malignant neoplasm of breast: Secondary | ICD-10-CM

## 2024-07-23 ENCOUNTER — Other Ambulatory Visit: Payer: Self-pay | Admitting: Family Medicine

## 2024-07-28 NOTE — Telephone Encounter (Signed)
 Requested medication (s) are due for refill today: Yes  Requested medication (s) are on the active medication list: Yes  Last refill:  06/27/24  Future visit scheduled: No  Notes to clinic:  See pharmacy requests.    Requested Prescriptions  Pending Prescriptions Disp Refills   estradiol  (ESTRACE ) 0.5 MG tablet [Pharmacy Med Name: ESTRADIOL  0.5 MG TABLET] 135 tablet 4    Sig: TAKE 1 & 1/2 TABLETS (0.75 MG TOTAL) BY MOUTH DAILY.     OB/GYN:  Estrogens  Failed - 07/28/2024 12:33 PM      Failed - Mammogram is up-to-date per Health Maintenance      Passed - Last BP in normal range    BP Readings from Last 1 Encounters:  03/11/24 128/81         Passed - Valid encounter within last 12 months    Recent Outpatient Visits           4 months ago Subacute cough   [facility] [place], Melanie T, NP   9 months ago Routine general medical examination at a health care facility   [facility], [place] P, DO   7 years ago Degenerative disc disease, cervical   Primary Care at [facility], [place], [place]   8 years ago Right hip pain   Primary Care at [place], [place] D, [name]   8 years ago Pain in joint involving ankle and foot, unspecified laterality   [facility], [place] BRAVO, MD

## 2024-07-28 NOTE — Telephone Encounter (Signed)
 She should have a years supply from February- too soon for refill. Needs follow up appointment in February for physical

## 2024-07-29 NOTE — Telephone Encounter (Signed)
 Please schedule appointment per Dr. Vicci.

## 2024-07-29 NOTE — Telephone Encounter (Signed)
 Physical scheduled.

## 2024-08-26 ENCOUNTER — Ambulatory Visit
Admission: RE | Admit: 2024-08-26 | Discharge: 2024-08-26 | Disposition: A | Discharge status: Home / Self Care | Source: Ambulatory Visit | Attending: Family Medicine | Admitting: Family Medicine

## 2024-08-26 DIAGNOSIS — Z1231 Encounter for screening mammogram for malignant neoplasm of breast: Secondary | ICD-10-CM | POA: Insufficient documentation

## 2024-08-31 ENCOUNTER — Ambulatory Visit: Payer: Self-pay | Admitting: Family Medicine

## 2024-09-03 ENCOUNTER — Telehealth: Admitting: Physician Assistant

## 2024-09-03 DIAGNOSIS — R3989 Other symptoms and signs involving the genitourinary system: Secondary | ICD-10-CM | POA: Diagnosis not present

## 2024-09-03 MED ORDER — CEPHALEXIN 500 MG PO CAPS: 500.0000 mg | ORAL_CAPSULE | Freq: Two times a day (BID) | ORAL | 0 refills | Status: AC

## 2024-09-03 NOTE — Progress Notes (Signed)

## 2024-10-26 ENCOUNTER — Encounter: Admitting: Family Medicine
